# Patient Record
Sex: Male | Born: 1950 | Race: White | Hispanic: No | Marital: Single | Smoking: Former smoker
Health system: Southern US, Community
[De-identification: ages and names within clinical notes are randomized; demographics above are authoritative.]

## PROBLEM LIST (undated history)

## (undated) DIAGNOSIS — E785 Hyperlipidemia, unspecified: Secondary | ICD-10-CM

## (undated) DIAGNOSIS — G4700 Insomnia, unspecified: Secondary | ICD-10-CM

## (undated) DIAGNOSIS — I2510 Atherosclerotic heart disease of native coronary artery without angina pectoris: Secondary | ICD-10-CM

## (undated) DIAGNOSIS — F8081 Childhood onset fluency disorder: Secondary | ICD-10-CM

## (undated) DIAGNOSIS — M1990 Unspecified osteoarthritis, unspecified site: Secondary | ICD-10-CM

## (undated) DIAGNOSIS — F429 Obsessive-compulsive disorder, unspecified: Secondary | ICD-10-CM

## (undated) DIAGNOSIS — I10 Essential (primary) hypertension: Secondary | ICD-10-CM

## (undated) DIAGNOSIS — Z720 Tobacco use: Secondary | ICD-10-CM

## (undated) HISTORY — DX: Childhood onset fluency disorder: F80.81

## (undated) HISTORY — DX: Tobacco use: Z72.0

## (undated) HISTORY — DX: Insomnia, unspecified: G47.00

## (undated) HISTORY — DX: Obsessive-compulsive disorder, unspecified: F42.9

## (undated) HISTORY — PX: WISDOM TOOTH EXTRACTION: SHX21

## (undated) HISTORY — PX: BACK SURGERY: SHX140

## (undated) HISTORY — DX: Unspecified osteoarthritis, unspecified site: M19.90

## (undated) HISTORY — DX: Hyperlipidemia, unspecified: E78.5

## (undated) HISTORY — DX: Atherosclerotic heart disease of native coronary artery without angina pectoris: I25.10

## (undated) HISTORY — DX: Essential (primary) hypertension: I10

---

## 1981-05-21 HISTORY — PX: VASECTOMY: SHX75

## 1997-09-07 ENCOUNTER — Other Ambulatory Visit: Admission: RE | Admit: 1997-09-07 | Discharge: 1997-09-07 | Payer: Self-pay | Admitting: Family Medicine

## 1997-12-09 ENCOUNTER — Emergency Department (HOSPITAL_COMMUNITY): Admission: EM | Admit: 1997-12-09 | Discharge: 1997-12-09 | Payer: Self-pay | Admitting: Emergency Medicine

## 1997-12-14 ENCOUNTER — Other Ambulatory Visit: Admission: RE | Admit: 1997-12-14 | Discharge: 1997-12-14 | Payer: Self-pay | Admitting: Family Medicine

## 1998-01-01 ENCOUNTER — Emergency Department (HOSPITAL_COMMUNITY): Admission: EM | Admit: 1998-01-01 | Discharge: 1998-01-01 | Payer: Self-pay | Admitting: Emergency Medicine

## 1998-08-03 ENCOUNTER — Encounter: Payer: Self-pay | Admitting: Emergency Medicine

## 1998-08-03 ENCOUNTER — Emergency Department (HOSPITAL_COMMUNITY): Admission: EM | Admit: 1998-08-03 | Discharge: 1998-08-03 | Payer: Self-pay | Admitting: Emergency Medicine

## 1998-12-02 ENCOUNTER — Emergency Department (HOSPITAL_COMMUNITY): Admission: EM | Admit: 1998-12-02 | Discharge: 1998-12-02 | Payer: Self-pay | Admitting: Emergency Medicine

## 1998-12-02 ENCOUNTER — Encounter: Payer: Self-pay | Admitting: Emergency Medicine

## 2000-05-09 ENCOUNTER — Encounter: Payer: Self-pay | Admitting: Emergency Medicine

## 2000-05-09 ENCOUNTER — Inpatient Hospital Stay (HOSPITAL_COMMUNITY): Admission: EM | Admit: 2000-05-09 | Discharge: 2000-05-10 | Payer: Self-pay | Admitting: Emergency Medicine

## 2000-05-10 ENCOUNTER — Encounter: Payer: Self-pay | Admitting: Cardiology

## 2004-06-28 ENCOUNTER — Ambulatory Visit: Payer: Self-pay | Admitting: Family Medicine

## 2004-07-12 ENCOUNTER — Ambulatory Visit: Payer: Self-pay | Admitting: Family Medicine

## 2004-08-01 ENCOUNTER — Ambulatory Visit: Payer: Self-pay | Admitting: Family Medicine

## 2004-10-10 ENCOUNTER — Ambulatory Visit: Payer: Self-pay | Admitting: Family Medicine

## 2004-11-01 ENCOUNTER — Ambulatory Visit: Payer: Self-pay | Admitting: Family Medicine

## 2004-11-08 ENCOUNTER — Ambulatory Visit: Payer: Self-pay | Admitting: Family Medicine

## 2004-11-15 ENCOUNTER — Ambulatory Visit: Payer: Self-pay | Admitting: Family Medicine

## 2005-02-20 ENCOUNTER — Ambulatory Visit: Payer: Self-pay | Admitting: Family Medicine

## 2005-06-06 ENCOUNTER — Ambulatory Visit: Payer: Self-pay | Admitting: Family Medicine

## 2005-09-26 ENCOUNTER — Ambulatory Visit: Payer: Self-pay | Admitting: Family Medicine

## 2005-11-07 ENCOUNTER — Ambulatory Visit: Payer: Self-pay | Admitting: Family Medicine

## 2005-12-20 ENCOUNTER — Emergency Department (HOSPITAL_COMMUNITY): Admission: EM | Admit: 2005-12-20 | Discharge: 2005-12-20 | Payer: Self-pay | Admitting: Emergency Medicine

## 2006-05-23 ENCOUNTER — Ambulatory Visit: Payer: Self-pay | Admitting: Family Medicine

## 2006-12-20 ENCOUNTER — Telehealth (INDEPENDENT_AMBULATORY_CARE_PROVIDER_SITE_OTHER): Payer: Self-pay | Admitting: *Deleted

## 2007-01-24 DIAGNOSIS — M545 Low back pain, unspecified: Secondary | ICD-10-CM | POA: Insufficient documentation

## 2007-01-28 ENCOUNTER — Telehealth: Payer: Self-pay | Admitting: Family Medicine

## 2007-01-29 ENCOUNTER — Ambulatory Visit: Payer: Self-pay | Admitting: Family Medicine

## 2007-01-29 DIAGNOSIS — M479 Spondylosis, unspecified: Secondary | ICD-10-CM | POA: Insufficient documentation

## 2007-02-04 ENCOUNTER — Telehealth: Payer: Self-pay | Admitting: Family Medicine

## 2007-02-12 ENCOUNTER — Telehealth: Payer: Self-pay | Admitting: Family Medicine

## 2007-02-27 ENCOUNTER — Telehealth: Payer: Self-pay | Admitting: Family Medicine

## 2007-04-30 ENCOUNTER — Ambulatory Visit: Payer: Self-pay | Admitting: Family Medicine

## 2007-04-30 DIAGNOSIS — E039 Hypothyroidism, unspecified: Secondary | ICD-10-CM | POA: Insufficient documentation

## 2007-04-30 DIAGNOSIS — I10 Essential (primary) hypertension: Secondary | ICD-10-CM | POA: Insufficient documentation

## 2007-04-30 DIAGNOSIS — K219 Gastro-esophageal reflux disease without esophagitis: Secondary | ICD-10-CM | POA: Insufficient documentation

## 2007-04-30 DIAGNOSIS — E785 Hyperlipidemia, unspecified: Secondary | ICD-10-CM | POA: Insufficient documentation

## 2007-05-06 ENCOUNTER — Telehealth: Payer: Self-pay | Admitting: Family Medicine

## 2007-05-06 ENCOUNTER — Ambulatory Visit: Payer: Self-pay | Admitting: Family Medicine

## 2007-05-07 ENCOUNTER — Encounter: Payer: Self-pay | Admitting: Family Medicine

## 2007-07-23 ENCOUNTER — Telehealth: Payer: Self-pay | Admitting: Family Medicine

## 2007-08-20 ENCOUNTER — Telehealth: Payer: Self-pay | Admitting: Family Medicine

## 2007-10-30 ENCOUNTER — Telehealth: Payer: Self-pay | Admitting: Family Medicine

## 2007-11-06 ENCOUNTER — Telehealth: Payer: Self-pay | Admitting: Family Medicine

## 2007-11-19 ENCOUNTER — Telehealth: Payer: Self-pay | Admitting: Family Medicine

## 2008-05-25 ENCOUNTER — Ambulatory Visit: Payer: Self-pay | Admitting: Family Medicine

## 2008-05-25 DIAGNOSIS — F411 Generalized anxiety disorder: Secondary | ICD-10-CM | POA: Insufficient documentation

## 2008-07-20 ENCOUNTER — Encounter: Payer: Self-pay | Admitting: Family Medicine

## 2008-09-14 ENCOUNTER — Ambulatory Visit: Payer: Self-pay | Admitting: Family Medicine

## 2010-06-18 LAB — CONVERTED CEMR LAB
ALT: 23 units/L (ref 0–53)
Basophils Absolute: 0 10*3/uL (ref 0.0–0.1)
Bilirubin Urine: NEGATIVE
Bilirubin, Direct: 0.1 mg/dL (ref 0.0–0.3)
Blood in Urine, dipstick: NEGATIVE
Calcium: 9.6 mg/dL (ref 8.4–10.5)
Cholesterol: 364 mg/dL (ref 0–200)
Eosinophils Absolute: 0.2 10*3/uL (ref 0.0–0.6)
Eosinophils Relative: 2.2 % (ref 0.0–5.0)
GFR calc Af Amer: 112 mL/min
GFR calc non Af Amer: 92 mL/min
Glucose, Bld: 97 mg/dL (ref 70–99)
Glucose, Urine, Semiquant: NEGATIVE
HDL: 37.4 mg/dL — ABNORMAL LOW (ref >39.0)
Ketones, urine, test strip: NEGATIVE
Lymphocytes Relative: 30.9 % (ref 12.0–46.0)
MCHC: 35.2 g/dL (ref 30.0–36.0)
MCV: 90 fL (ref 78.0–100.0)
Monocytes Relative: 9.7 % (ref 3.0–11.0)
Neutro Abs: 3.9 10*3/uL (ref 1.4–7.7)
Nitrite: NEGATIVE
PSA: 2.51 ng/mL (ref 0.10–4.00)
Platelets: 233 10*3/uL (ref 150–400)
Protein, U semiquant: NEGATIVE
Specific Gravity, Urine: 1.015
TSH: 1.22 microintl units/mL (ref 0.35–5.50)
Urobilinogen, UA: 0.2
WBC Urine, dipstick: NEGATIVE
WBC: 7 10*3/uL (ref 4.5–10.5)
pH: 5

## 2010-10-06 NOTE — Discharge Summary (Signed)
[place]  Patient:    Anthony Benton, Anthony Benton                       MRN: [id number] Adm. Date:  [id number] Disc. Date: 05/10/00 Attending:  Rollene Rotunda Dictator:   Joellyn Rued, P.A.C. CC:         Leroy Sea., M.D.  Lacretia Leigh. Quintella Reichert, M.D.   Referring Physician Discharge [place]  DATE OF BIRTH:  [birth year]  SUMMARY OF HISTORY:  Anthony Benton is a 60 year old white male with no known history of coronary disease.  On the day of admission, he had an episode of chest discomfort which he described as numbing and tingling sharp discomfort going from the right to his left side of his chest that began with urination and lasted less than one minute, associated with dizziness.  Approximately eight weeks ago, he had a mild sternal separation and has been taking Tylenol; however, this discomfort was not similar.  He has chronic dyspnea on exertion and occasional skipped beats and long-term PND.  He initially presented to Urgent Care and was transferred to [facility] for further evaluation.  He also has a history of hyperlipidemia but has been wanting a low fat diet, and remote tobacco.  ALLERGIES:  He has a history of ASPIRIN intolerance and muscle aches with ZOCOR.  LABORATORY DATA:  Initial H&H is 14.1 and 38.4, normal indices, platelets 195, wbcs 6.8.  Sodium 137, potassium 3.9, BUN 11, creatinine 0.9, glucose 97. Normal LFTs.  CK total, MBs, and troponins were negative for myocardial infarction.  Lipid panel was performed but is pending at the time of this dictation.  HOSPITAL COURSE:  Anthony Benton was admitted to 6500.  Overnight, he did not have any further discomfort.  Enzymes and EKGs were negative for myocardial infarction.  An adenosine stress test was performed without difficulty. Imaging showed an EF of 55%, no signs of ischemia or scarring.  After reviewing with Dr. Antoine Poche, it was felt that he could be discharge home.  DISCHARGE  DIAGNOSIS:  Noncardiac chest discomfort.  DISPOSITION:  He was discharged home and asked to continue on his home medications.  MEDICATIONS:  P.r.n. Valium, Percocet, and Ventak.  DIET:  He was asked to maintain low salt/fat/cholesterol diet.  FOLLOW-UP:  Will Dr. Scotty Court or an associate of his choice after the holidays. DD:  05/10/00 TD:  05/10/00 Job: [id number]

## 2011-02-19 HISTORY — PX: CORONARY ANGIOPLASTY: SHX604

## 2011-03-19 HISTORY — PX: CARDIAC CATHETERIZATION: SHX172

## 2011-07-17 DIAGNOSIS — I498 Other specified cardiac arrhythmias: Secondary | ICD-10-CM | POA: Diagnosis not present

## 2011-07-17 DIAGNOSIS — E785 Hyperlipidemia, unspecified: Secondary | ICD-10-CM | POA: Diagnosis not present

## 2011-07-17 DIAGNOSIS — I2510 Atherosclerotic heart disease of native coronary artery without angina pectoris: Secondary | ICD-10-CM | POA: Diagnosis not present

## 2011-07-17 DIAGNOSIS — F411 Generalized anxiety disorder: Secondary | ICD-10-CM | POA: Diagnosis not present

## 2011-07-17 DIAGNOSIS — Z79899 Other long term (current) drug therapy: Secondary | ICD-10-CM | POA: Diagnosis not present

## 2011-07-18 DIAGNOSIS — I2510 Atherosclerotic heart disease of native coronary artery without angina pectoris: Secondary | ICD-10-CM | POA: Diagnosis not present

## 2011-08-22 DIAGNOSIS — I2589 Other forms of chronic ischemic heart disease: Secondary | ICD-10-CM | POA: Diagnosis not present

## 2011-08-22 DIAGNOSIS — I10 Essential (primary) hypertension: Secondary | ICD-10-CM | POA: Diagnosis not present

## 2011-08-22 DIAGNOSIS — E785 Hyperlipidemia, unspecified: Secondary | ICD-10-CM | POA: Diagnosis not present

## 2011-10-19 DIAGNOSIS — E785 Hyperlipidemia, unspecified: Secondary | ICD-10-CM | POA: Diagnosis not present

## 2011-10-19 DIAGNOSIS — Z79899 Other long term (current) drug therapy: Secondary | ICD-10-CM | POA: Diagnosis not present

## 2012-02-19 DIAGNOSIS — E785 Hyperlipidemia, unspecified: Secondary | ICD-10-CM | POA: Diagnosis not present

## 2012-02-19 DIAGNOSIS — Z125 Encounter for screening for malignant neoplasm of prostate: Secondary | ICD-10-CM | POA: Diagnosis not present

## 2012-02-19 DIAGNOSIS — I2589 Other forms of chronic ischemic heart disease: Secondary | ICD-10-CM | POA: Diagnosis not present

## 2012-02-19 DIAGNOSIS — M549 Dorsalgia, unspecified: Secondary | ICD-10-CM | POA: Diagnosis not present

## 2012-02-19 DIAGNOSIS — I10 Essential (primary) hypertension: Secondary | ICD-10-CM | POA: Diagnosis not present

## 2012-06-20 DIAGNOSIS — I10 Essential (primary) hypertension: Secondary | ICD-10-CM | POA: Diagnosis not present

## 2012-06-20 DIAGNOSIS — E785 Hyperlipidemia, unspecified: Secondary | ICD-10-CM | POA: Diagnosis not present

## 2012-06-20 DIAGNOSIS — I2589 Other forms of chronic ischemic heart disease: Secondary | ICD-10-CM | POA: Diagnosis not present

## 2012-06-20 DIAGNOSIS — M5380 Other specified dorsopathies, site unspecified: Secondary | ICD-10-CM | POA: Diagnosis not present

## 2012-09-08 DIAGNOSIS — I10 Essential (primary) hypertension: Secondary | ICD-10-CM | POA: Diagnosis not present

## 2012-09-08 DIAGNOSIS — I498 Other specified cardiac arrhythmias: Secondary | ICD-10-CM | POA: Diagnosis not present

## 2012-09-08 DIAGNOSIS — E785 Hyperlipidemia, unspecified: Secondary | ICD-10-CM | POA: Diagnosis not present

## 2012-09-08 DIAGNOSIS — I2510 Atherosclerotic heart disease of native coronary artery without angina pectoris: Secondary | ICD-10-CM | POA: Diagnosis not present

## 2013-01-12 DIAGNOSIS — E785 Hyperlipidemia, unspecified: Secondary | ICD-10-CM | POA: Diagnosis not present

## 2013-01-12 DIAGNOSIS — M549 Dorsalgia, unspecified: Secondary | ICD-10-CM | POA: Diagnosis not present

## 2013-01-12 DIAGNOSIS — M1990 Unspecified osteoarthritis, unspecified site: Secondary | ICD-10-CM | POA: Diagnosis not present

## 2013-04-01 DIAGNOSIS — E7800 Pure hypercholesterolemia, unspecified: Secondary | ICD-10-CM | POA: Diagnosis not present

## 2013-04-01 DIAGNOSIS — I259 Chronic ischemic heart disease, unspecified: Secondary | ICD-10-CM | POA: Diagnosis not present

## 2013-04-01 DIAGNOSIS — Z87891 Personal history of nicotine dependence: Secondary | ICD-10-CM | POA: Diagnosis not present

## 2013-04-01 DIAGNOSIS — M545 Low back pain, unspecified: Secondary | ICD-10-CM | POA: Diagnosis not present

## 2013-04-01 DIAGNOSIS — F8081 Childhood onset fluency disorder: Secondary | ICD-10-CM | POA: Diagnosis not present

## 2013-04-01 DIAGNOSIS — I10 Essential (primary) hypertension: Secondary | ICD-10-CM | POA: Diagnosis not present

## 2013-04-01 DIAGNOSIS — G4700 Insomnia, unspecified: Secondary | ICD-10-CM | POA: Diagnosis not present

## 2013-04-21 ENCOUNTER — Encounter: Payer: Self-pay | Admitting: Interventional Cardiology

## 2013-04-21 ENCOUNTER — Encounter (INDEPENDENT_AMBULATORY_CARE_PROVIDER_SITE_OTHER): Payer: Self-pay

## 2013-04-21 ENCOUNTER — Ambulatory Visit (INDEPENDENT_AMBULATORY_CARE_PROVIDER_SITE_OTHER): Payer: Medicare Other | Admitting: Interventional Cardiology

## 2013-04-21 VITALS — BP 158/92 | HR 53 | Ht 68.0 in | Wt 164.0 lb

## 2013-04-21 DIAGNOSIS — E785 Hyperlipidemia, unspecified: Secondary | ICD-10-CM

## 2013-04-21 DIAGNOSIS — I10 Essential (primary) hypertension: Secondary | ICD-10-CM

## 2013-04-21 DIAGNOSIS — I259 Chronic ischemic heart disease, unspecified: Secondary | ICD-10-CM | POA: Diagnosis not present

## 2013-04-21 DIAGNOSIS — I25810 Atherosclerosis of coronary artery bypass graft(s) without angina pectoris: Secondary | ICD-10-CM

## 2013-04-21 DIAGNOSIS — I2510 Atherosclerotic heart disease of native coronary artery without angina pectoris: Secondary | ICD-10-CM | POA: Diagnosis not present

## 2013-04-21 NOTE — Patient Instructions (Signed)
Your physician recommends that you continue on your current medications as directed. Please refer to the Current Medication list given to you today.  Your physician wants you to follow-up in: 1 year with Dr. Varanasi. You will receive a reminder letter in the mail two months in advance. If you don't receive a letter, please call our office to schedule the follow-up appointment.  

## 2013-04-21 NOTE — Progress Notes (Signed)
Patient ID: Anthony Benton, male   DOB: [birth year], 62 y.o.   MRN: [id number]     Patient ID: Anthony Benton MRN: [id number] DOB/AGE: 03/22/1951 62 y.o.   Referring Physician Dr. Sigmund Hazel   Reason for Consultation CAD  HPI: 62 y/o who has a h/o CAD.  He had 2 stents to the RCA, 3.5x 15 Xience and 3.5 x 8 Xience.  He was flown by helicopter but he did not have a heart attack.  His initial sx were associated with walking on his long  Driveway, which he would do for exercise.  While walking one day, he had a "popping feeling" in his chest and then [facility].  He went into the house and sat and it resolved.  He felt very weak while walking to the house.  He tried to walk again and then sx returned.  He had not felt these sx like this before.  His face was red.  He had repetetive episodes and sought attention.    He has high cholesterol.  It runs in the family.  Mother's cholesterol is over 300.  No recent sx like what he had at the time of the stents.  He was seeing his cardiologist once a year. He feels well.  He walks [place] exercise at the mall.  He has an exercise Wii board at home.  No sx with these activities.  He took Effient for a year after the stents.     Current Outpatient Prescriptions  Medication Sig Dispense Refill  . aspirin 81 MG tablet Take 81 mg by mouth daily.      Marland Kitchen atorvastatin (LIPITOR) 20 MG tablet Take 20 mg by mouth daily at 6 PM.       . chlorhexidine (PERIDEX) 0.12 % solution Use as directed 15 mLs in the mouth or throat 2 (two) times daily.      . diazepam (VALIUM) 5 MG tablet Take 5 mg by mouth every 6 (six) hours as needed.       Marland Kitchen HYDROcodone-acetaminophen (NORCO) 10-325 MG per tablet Take 1 tablet by mouth every 6 (six) hours as needed.       . metoprolol succinate (TOPROL-XL) 50 MG 24 hr tablet Take 50 mg by mouth daily.       . Omega-3 Fatty Acids (FISH OIL) 1000 MG CAPS Take 1,000 mg by mouth daily.      . temazepam (RESTORIL) 15 MG capsule Take 15 mg  by mouth at bedtime.        No current facility-administered medications for this visit.   Past Medical History  Diagnosis Date  . Coronary artery disease   . OCD (obsessive compulsive disorder)     with anxiety  . Arthritis     neck, knees; seen Dr. Jerl Santos, Norco 3-4 per day, 14 yrs, stable  . Insomnia   . Stuttering     uses Diazepam  . Hyperlipidemia   . Hypertension   . Tobacco abuse 1999, quit    Family History  Problem Relation Age of Onset  . Arthritis Father   . Hypertension Father   . Prostate cancer Father   . Arthritis Sister   . Breast cancer Sister     History   Social History  . Marital Status: Married    Spouse Name: N/A    Number of Children: N/A  . Years of Education: N/A   Occupational History  . Not on file.   Social History Main Topics  .  Smoking status: Former Games developer  . Smokeless tobacco: Not on file  . Alcohol Use: No  . Drug Use: Not on file  . Sexual Activity: Not on file   Other Topics Concern  . Not on file   Social History Narrative  . No narrative on file    Past Surgical History  Procedure Laterality Date  . Cardiac catheterization  03/19/11    stent X 2, in [place]  . Coronary angioplasty  10/12  . Vasectomy  1983  . Back surgery  1994, 1997  . Wisdom tooth extraction        (Not in a hospital admission)  Review of systems complete and found to be negative unless listed above .  No nausea, vomiting.  No fever chills, No focal weakness,  No palpitations.  Physical Exam: Filed Vitals:   04/21/13 1106  BP: 158/92  Pulse: 53    Weight: 164 lb (74.39 kg)  Physical exam:  [place]/AT EOMI No JVD, No carotid bruit RRR S1S2  No wheezing Soft. NT, nondistended No edema. No focal motor or sensory deficits Normal affect  Labs:   Lab Results  Component Value Date   WBC 7.0 04/30/2007   HGB 14.3 04/30/2007   HCT 40.5 04/30/2007   MCV 90.0 04/30/2007   PLT 233 04/30/2007   No results found for this basename: NA, K,  CL, CO2, BUN, CREATININE, CALCIUM, LABALBU, PROT, BILITOT, ALKPHOS, ALT, AST, GLUCOSE,  in the last 168 hours No results found for this basename: CKTOTAL, CKMB, CKMBINDEX, TROPONINI    Lab Results  Component Value Date   CHOL 364* 04/30/2007   Lab Results  Component Value Date   HDL 37.4* 04/30/2007   No results found for this basename: [facility]   Lab Results  Component Value Date   TRIG 325* 04/30/2007   Lab Results  Component Value Date   CHOLHDL 9.7 CALC 04/30/2007   Lab Results  Component Value Date   LDLDIRECT 231.1 04/30/2007       EKG:NSR, NSST  ASSESSMENT AND PLAN:  1. CAD: s/p DES x2 in the RCA in 2012.  No angina.  COntinue risk factor modification.  Without medication, his lipids are very high. This runs in his family. I stressed the importance of diet control and exercise.  He needs at least 150 minutes per week of cardiovascular exercise.  2. hypertension: Blood pressure higher in the doctor's office. Continue metoprolol. I Asked him to check his blood pressure at home. If readings are less than 140/90, he doesn't need to check more than once a month.  3. hyperlipidemia: He has been on lovastatin in the past. This was ineffective. He states that his cholesterol is much better controlled on his current dose of atorvastatin. This was last checked in [place] with his primary care Dr. He has an appointment with Dr. Hyacinth Meeker to check his lipids in February. I reviewed his prior lab results. He has total cholesterol values over 300 in the past when not taking a statin.  Signed:   Fredric Mare, MD, [facility] 04/21/2013, 11:31 AM

## 2013-06-25 DIAGNOSIS — M545 Low back pain, unspecified: Secondary | ICD-10-CM | POA: Diagnosis not present

## 2013-07-02 DIAGNOSIS — E7800 Pure hypercholesterolemia, unspecified: Secondary | ICD-10-CM | POA: Diagnosis not present

## 2013-07-02 DIAGNOSIS — G4700 Insomnia, unspecified: Secondary | ICD-10-CM | POA: Diagnosis not present

## 2013-07-02 DIAGNOSIS — I10 Essential (primary) hypertension: Secondary | ICD-10-CM | POA: Diagnosis not present

## 2013-07-02 DIAGNOSIS — M545 Low back pain, unspecified: Secondary | ICD-10-CM | POA: Diagnosis not present

## 2013-07-02 DIAGNOSIS — Z87891 Personal history of nicotine dependence: Secondary | ICD-10-CM | POA: Diagnosis not present

## 2013-07-02 DIAGNOSIS — I2510 Atherosclerotic heart disease of native coronary artery without angina pectoris: Secondary | ICD-10-CM | POA: Diagnosis not present

## 2013-07-03 ENCOUNTER — Telehealth: Payer: Self-pay | Admitting: Interventional Cardiology

## 2013-07-03 NOTE — Telephone Encounter (Signed)
To Dr. Varanasi, please advise.  

## 2013-07-03 NOTE — Telephone Encounter (Signed)
New Problem:   According to pt, he was seen by his PcP and was told to be seen by his cardiologist today. Pt's BP has been up... 189/110 pulse 65 ... 194/96 pulse 64... 179/100 pulse was 64.   177/100 pulse 58  182/93 pulse 63... All vitals are from last night and this morning... Pt would like a call back to know if he can be seen by his cardiologist.

## 2013-07-03 NOTE — Telephone Encounter (Signed)
OK to start lisinopril 10 mg daily for one week.  BMet in one week.  CONtinue to check BP and let us know if it runs > 140/90.  We can see him today if he would like.

## 2013-07-03 NOTE — Telephone Encounter (Signed)
Spoke with pt and Dr. Sabra Heck already started pt on lisinopril 20 mg qd and Dr. Irish Lack is ok with this. Pt will follow with Dr. Sabra Heck regarding further BP medication adjustments.

## 2013-07-13 DIAGNOSIS — Z1211 Encounter for screening for malignant neoplasm of colon: Secondary | ICD-10-CM | POA: Diagnosis not present

## 2013-08-05 DIAGNOSIS — R195 Other fecal abnormalities: Secondary | ICD-10-CM | POA: Diagnosis not present

## 2013-08-05 DIAGNOSIS — Z1211 Encounter for screening for malignant neoplasm of colon: Secondary | ICD-10-CM | POA: Diagnosis not present

## 2013-10-19 DIAGNOSIS — M545 Low back pain, unspecified: Secondary | ICD-10-CM | POA: Diagnosis not present

## 2013-10-19 DIAGNOSIS — F8081 Childhood onset fluency disorder: Secondary | ICD-10-CM | POA: Diagnosis not present

## 2013-10-19 DIAGNOSIS — E7800 Pure hypercholesterolemia, unspecified: Secondary | ICD-10-CM | POA: Diagnosis not present

## 2013-10-19 DIAGNOSIS — R195 Other fecal abnormalities: Secondary | ICD-10-CM | POA: Diagnosis not present

## 2013-10-19 DIAGNOSIS — I10 Essential (primary) hypertension: Secondary | ICD-10-CM | POA: Diagnosis not present

## 2013-10-19 DIAGNOSIS — G4700 Insomnia, unspecified: Secondary | ICD-10-CM | POA: Diagnosis not present

## 2013-10-19 DIAGNOSIS — R5381 Other malaise: Secondary | ICD-10-CM | POA: Diagnosis not present

## 2013-10-19 DIAGNOSIS — R5383 Other fatigue: Secondary | ICD-10-CM | POA: Diagnosis not present

## 2013-10-26 ENCOUNTER — Telehealth: Payer: Self-pay | Admitting: Family Medicine

## 2013-10-26 NOTE — Telephone Encounter (Signed)
Pts has seen dr Joni Fears in past and would like to re-est with dr fry. Pt wife also would like to re-est. Pt wife is aware dr fry said no, hus is requesting this time. Please advise

## 2013-10-27 NOTE — Telephone Encounter (Signed)
Sorry but I am too full  

## 2013-10-27 NOTE — Telephone Encounter (Signed)
Pt is aware.  

## 2014-01-28 ENCOUNTER — Encounter: Payer: Self-pay | Admitting: Interventional Cardiology

## 2014-01-28 DIAGNOSIS — M545 Low back pain, unspecified: Secondary | ICD-10-CM | POA: Diagnosis not present

## 2014-01-28 DIAGNOSIS — F8081 Childhood onset fluency disorder: Secondary | ICD-10-CM | POA: Diagnosis not present

## 2014-01-28 DIAGNOSIS — F411 Generalized anxiety disorder: Secondary | ICD-10-CM | POA: Diagnosis not present

## 2014-01-28 DIAGNOSIS — Z79899 Other long term (current) drug therapy: Secondary | ICD-10-CM | POA: Diagnosis not present

## 2014-01-28 DIAGNOSIS — G4700 Insomnia, unspecified: Secondary | ICD-10-CM | POA: Diagnosis not present

## 2014-01-28 DIAGNOSIS — I2510 Atherosclerotic heart disease of native coronary artery without angina pectoris: Secondary | ICD-10-CM | POA: Diagnosis not present

## 2014-01-28 DIAGNOSIS — E7800 Pure hypercholesterolemia, unspecified: Secondary | ICD-10-CM | POA: Diagnosis not present

## 2014-02-16 ENCOUNTER — Telehealth: Payer: Self-pay | Admitting: Interventional Cardiology

## 2014-02-16 NOTE — Telephone Encounter (Signed)
Last stents were in 2012 per our records.  If he has not had a stent since that time, he can get the shot if it helps his back.

## 2014-02-16 NOTE — Telephone Encounter (Signed)
New message          Pt has chronic back pain and he normally gets a shot every Fall / pt would like to know if he should take the shot from a cardiac standpoint

## 2014-02-16 NOTE — Telephone Encounter (Signed)
To Dr. Varanasi, please advise.  

## 2014-02-17 NOTE — Telephone Encounter (Signed)
lmtrc

## 2014-02-18 NOTE — Telephone Encounter (Signed)
Pt states his last stent were placed in 2012. Pt notified that he can have shot in his back.

## 2014-03-01 ENCOUNTER — Ambulatory Visit (INDEPENDENT_AMBULATORY_CARE_PROVIDER_SITE_OTHER): Payer: Medicare Other | Admitting: Interventional Cardiology

## 2014-03-01 ENCOUNTER — Encounter: Payer: Self-pay | Admitting: Interventional Cardiology

## 2014-03-01 VITALS — BP 168/90 | HR 59 | Ht 68.0 in | Wt 166.1 lb

## 2014-03-01 DIAGNOSIS — E785 Hyperlipidemia, unspecified: Secondary | ICD-10-CM

## 2014-03-01 DIAGNOSIS — I10 Essential (primary) hypertension: Secondary | ICD-10-CM

## 2014-03-01 DIAGNOSIS — I2510 Atherosclerotic heart disease of native coronary artery without angina pectoris: Secondary | ICD-10-CM | POA: Diagnosis not present

## 2014-03-01 NOTE — Progress Notes (Signed)
Patient ID: Anthony Benton, male   DOB: 10-24-1950, 63 y.o.   MRN: [id number] Patient ID: Anthony Benton, male   DOB: 1950/10/06, 63 y.o.   MRN: [id number]     Patient ID: Anthony Benton MRN: [id number] DOB/AGE: [birth year] 63 y.o.   Referring Physician Dr. Kathyrn Lass   Reason for Consultation CAD  HPI: 63 y/o who has a h/o CAD.  He had 2 stents to the RCA, 3.5x 15 Xience and 3.5 x 8 Xience in 02/2011.  He was flown by helicopter but he did not have a heart attack.  His initial sx were associated with walking on his long  Driveway, which he would do for exercise.  While walking one day, he had a "popping feeling" in his chest and then [facility].  He went into the house and sat and it resolved.  He felt very weak while walking to the house.  He tried to walk again and then sx returned.  He had not felt these sx like this before.  His face was red.  He had repetetive episodes and sought attention.    He has high cholesterol.  It runs in the family.  Mother's cholesterol is over 300.  No recent sx like what he had at the time of the stents.  He was seeing his cardiologist once a year. He feels well.  He walks regularly for exercise at the mall.  He has an exercise Wii board at home.  No sx with these activities.  He took Effient for a year after the stents.    He walks several miles a day for 3-4 x/week. No cardiac sx.     Current Outpatient Prescriptions  Medication Sig Dispense Refill  . aspirin 81 MG tablet Take 81 mg by mouth daily.      Marland Kitchen atorvastatin (LIPITOR) 20 MG tablet Take 40 mg by mouth daily at 6 PM.       . chlorhexidine (PERIDEX) 0.12 % solution Use as directed 15 mLs in the mouth or throat 2 (two) times daily.      . diazepam (VALIUM) 5 MG tablet Take 5 mg by mouth every 6 (six) hours as needed.       Marland Kitchen HYDROcodone-acetaminophen (NORCO) 10-325 MG per tablet Take 1 tablet by mouth every 6 (six) hours as needed.       . metoprolol succinate (TOPROL-XL) 50 MG 24 hr tablet Take 50  mg by mouth daily.       . Omega-3 Fatty Acids (FISH OIL) 1000 MG CAPS Take 1,000 mg by mouth daily.      . temazepam (RESTORIL) 15 MG capsule Take 15 mg by mouth at bedtime.        No current facility-administered medications for this visit.   Past Medical History  Diagnosis Date  . Coronary artery disease   . OCD (obsessive compulsive disorder)     with anxiety  . Arthritis     neck, knees; seen Dr. Rhona Raider, Norco 3-4 per day, 14 yrs, stable  . Insomnia   . Stuttering     uses Diazepam  . Hyperlipidemia   . Hypertension   . Tobacco abuse 1999, quit    Family History  Problem Relation Age of Onset  . Arthritis Father   . Hypertension Father   . Prostate cancer Father   . Arthritis Sister   . Breast cancer Sister     History   Social History  . Marital Status: Married  Spouse Name: N/A    Number of Children: N/A  . Years of Education: N/A   Occupational History  . Not on file.   Social History Main Topics  . Smoking status: Former Research scientist (life sciences)  . Smokeless tobacco: Not on file  . Alcohol Use: No  . Drug Use: Not on file  . Sexual Activity: Not on file   Other Topics Concern  . Not on file   Social History Narrative  . No narrative on file    Past Surgical History  Procedure Laterality Date  . Cardiac catheterization  03/19/11    stent X 2, in [place]  . Coronary angioplasty  10/12  . Vasectomy  1983  . Back surgery  1994, 1997  . Wisdom tooth extraction        (Not in a hospital admission)  Review of systems complete and found to be negative unless listed above .  No nausea, vomiting.  No fever chills, No focal weakness,  No palpitations.  Physical Exam: Filed Vitals:   03/01/14 1328  BP: 168/90  Pulse: 59    Weight: 166 lb 1.9 oz (75.352 kg)  Physical exam:  [place]/AT EOMI No JVD, No carotid bruit RRR S1S2  No wheezing Soft. NT, nondistended No edema. No focal motor or sensory deficits Normal affect  Labs:   Lab Results  Component Value  Date   WBC 7.0 04/30/2007   HGB 14.3 04/30/2007   HCT 40.5 04/30/2007   MCV 90.0 04/30/2007   PLT 233 04/30/2007   No results found for this basename: NA, K, CL, CO2, BUN, CREATININE, CALCIUM, LABALBU, PROT, BILITOT, ALKPHOS, ALT, AST, GLUCOSE,  in the last 168 hours No results found for this basename: CKTOTAL,  CKMB,  CKMBINDEX,  TROPONINI    Lab Results  Component Value Date   CHOL 364* 04/30/2007   Lab Results  Component Value Date   HDL 37.4* 04/30/2007   No results found for this basename: [facility]   Lab Results  Component Value Date   TRIG 325* 04/30/2007   Lab Results  Component Value Date   CHOLHDL 9.7 CALC 04/30/2007   Lab Results  Component Value Date   LDLDIRECT 231.1 04/30/2007       EKG:NSR, NSST  ASSESSMENT AND PLAN:  1. CAD: s/p DES x2 in the RCA in 2012.  No angina.  COntinue risk factor modification.  Without medication, his lipids are very high. This runs in his family. I stressed the importance of diet control and exercise.  He needs at least 150 minutes per week of cardiovascular exercise.  He is doing at least this much walking.    2. hypertension: Blood pressure higher in the doctor's office. Continue metoprolol. I Asked him to check his blood pressure at home. If readings are less than 140/90, he doesn't need to check more than once a month.  3. hyperlipidemia: He has been on lovastatin in the past. This was ineffective. He states that his cholesterol is much better controlled on his current dose of atorvastatin.  He has an appointment with Dr. Sabra Heck to check his lipids in February. I reviewed his prior lab results. He has total cholesterol values over 300 in the past when not taking a statin.  LDL 114 on most recent test.  Normal liver and kidney function.LDL target < 100.    Chronic back pain.  OK to have steroid injections.  Signed:   Mina Marble, MD, [facility] 03/01/2014, 1:56 PM

## 2014-03-01 NOTE — Patient Instructions (Addendum)
Your physician recommends that you continue on your current medications as directed. Please refer to the Current Medication list given to you today.  Your physician recommends that you return for a FASTING lipid and hepatic on 03/22/14.  Your physician wants you to follow-up in: 1 year with Dr. Irish Lack. You will receive a reminder letter in the mail two months in advance. If you don't receive a letter, please call our office to schedule the follow-up appointment.

## 2014-03-04 DIAGNOSIS — M545 Low back pain: Secondary | ICD-10-CM | POA: Diagnosis not present

## 2014-03-04 DIAGNOSIS — M2560 Stiffness of unspecified joint, not elsewhere classified: Secondary | ICD-10-CM | POA: Diagnosis not present

## 2014-03-15 DIAGNOSIS — M25562 Pain in left knee: Secondary | ICD-10-CM | POA: Diagnosis not present

## 2014-03-22 ENCOUNTER — Other Ambulatory Visit (INDEPENDENT_AMBULATORY_CARE_PROVIDER_SITE_OTHER): Payer: Medicare Other | Admitting: *Deleted

## 2014-03-22 DIAGNOSIS — E785 Hyperlipidemia, unspecified: Secondary | ICD-10-CM | POA: Diagnosis not present

## 2014-03-22 LAB — HEPATIC FUNCTION PANEL
ALBUMIN: 3.5 g/dL (ref 3.5–5.2)
ALK PHOS: 67 U/L (ref 39–117)
ALT: 12 U/L (ref 0–53)
AST: 15 U/L (ref 0–37)
Bilirubin, Direct: 0.1 mg/dL (ref 0.0–0.3)
TOTAL PROTEIN: 7 g/dL (ref 6.0–8.3)
Total Bilirubin: 0.8 mg/dL (ref 0.2–1.2)

## 2014-03-22 LAB — LIPID PANEL
CHOL/HDL RATIO: 4
Cholesterol: 189 mg/dL (ref 0–200)
HDL: 45.5 mg/dL (ref >39.00)
LDL CALC: 108 mg/dL — AB (ref 0–99)
NONHDL: 143.5
TRIGLYCERIDES: 180 mg/dL — AB (ref 0.0–149.0)
VLDL: 36 mg/dL (ref 0.0–40.0)

## 2014-04-08 NOTE — Progress Notes (Signed)
Patient's only phone number is disconnected. Will send him a letter to call us.

## 2014-04-14 ENCOUNTER — Telehealth: Payer: Self-pay | Admitting: Interventional Cardiology

## 2014-04-14 DIAGNOSIS — E785 Hyperlipidemia, unspecified: Secondary | ICD-10-CM

## 2014-04-14 MED ORDER — ATORVASTATIN CALCIUM 80 MG PO TABS: ORAL_TABLET | ORAL | Status: DC

## 2014-04-14 NOTE — Telephone Encounter (Signed)
The patient is aware of his cholesterol readings. He already has some joint discomfort from the lipitor 40 mg daily that he takes. He will try to increase lipitor to 40 mg once every other day alternating with 80 mg once every other day. He will call if he does not tolerate the increase.

## 2014-04-14 NOTE — Telephone Encounter (Signed)
New message ° ° ° ° °Want lab results °

## 2014-05-10 DIAGNOSIS — Z87891 Personal history of nicotine dependence: Secondary | ICD-10-CM | POA: Diagnosis not present

## 2014-05-10 DIAGNOSIS — I2510 Atherosclerotic heart disease of native coronary artery without angina pectoris: Secondary | ICD-10-CM | POA: Diagnosis not present

## 2014-05-10 DIAGNOSIS — G4700 Insomnia, unspecified: Secondary | ICD-10-CM | POA: Diagnosis not present

## 2014-05-10 DIAGNOSIS — M545 Low back pain: Secondary | ICD-10-CM | POA: Diagnosis not present

## 2014-05-10 DIAGNOSIS — F8081 Childhood onset fluency disorder: Secondary | ICD-10-CM | POA: Diagnosis not present

## 2014-05-10 DIAGNOSIS — I10 Essential (primary) hypertension: Secondary | ICD-10-CM | POA: Diagnosis not present

## 2014-05-10 DIAGNOSIS — E780 Pure hypercholesterolemia: Secondary | ICD-10-CM | POA: Diagnosis not present

## 2014-05-11 ENCOUNTER — Other Ambulatory Visit: Payer: Self-pay | Admitting: *Deleted

## 2014-05-11 DIAGNOSIS — E785 Hyperlipidemia, unspecified: Secondary | ICD-10-CM

## 2014-05-11 MED ORDER — ATORVASTATIN CALCIUM 80 MG PO TABS: ORAL_TABLET | ORAL | Status: DC

## 2014-06-09 DIAGNOSIS — M25562 Pain in left knee: Secondary | ICD-10-CM | POA: Diagnosis not present

## 2014-07-19 ENCOUNTER — Telehealth: Payer: Self-pay | Admitting: Interventional Cardiology

## 2014-07-19 NOTE — Telephone Encounter (Signed)
New Message   Please give patient a call. He is trying to schedule a lab appt and I was unable to without an order.( Your physician recommends that you return for a FASTING lipid and hepatic on 03/22/14.) This is the note from his last visit which was on in October.

## 2014-07-19 NOTE — Telephone Encounter (Signed)
I called the patient to schedule labs for him. He states that he has scheduled these else where and did not let me know where. He states he was very disturbed by the way the person answering the phone spoke with him. I apologized for his experience and asked if I could give him our fax # that he could have these sent to our office. He asked for my direct #. I advised him I am not always at the same phone. He did not want our fax #. He stated he arranged for his labs else where and "let's just leave it at that."

## 2014-07-19 NOTE — Telephone Encounter (Signed)
Will notify scheduling supervisor.

## 2014-08-02 DIAGNOSIS — E780 Pure hypercholesterolemia: Secondary | ICD-10-CM | POA: Diagnosis not present

## 2014-08-04 ENCOUNTER — Encounter: Payer: Self-pay | Admitting: Interventional Cardiology

## 2014-08-23 DIAGNOSIS — G4700 Insomnia, unspecified: Secondary | ICD-10-CM | POA: Diagnosis not present

## 2014-08-23 DIAGNOSIS — E780 Pure hypercholesterolemia: Secondary | ICD-10-CM | POA: Diagnosis not present

## 2014-08-23 DIAGNOSIS — I10 Essential (primary) hypertension: Secondary | ICD-10-CM | POA: Diagnosis not present

## 2014-08-23 DIAGNOSIS — I2510 Atherosclerotic heart disease of native coronary artery without angina pectoris: Secondary | ICD-10-CM | POA: Diagnosis not present

## 2014-08-23 DIAGNOSIS — F8081 Childhood onset fluency disorder: Secondary | ICD-10-CM | POA: Diagnosis not present

## 2014-08-23 DIAGNOSIS — M545 Low back pain: Secondary | ICD-10-CM | POA: Diagnosis not present

## 2014-10-29 DIAGNOSIS — M25562 Pain in left knee: Secondary | ICD-10-CM | POA: Diagnosis not present

## 2014-11-17 ENCOUNTER — Other Ambulatory Visit: Payer: Self-pay | Admitting: Interventional Cardiology

## 2014-11-17 ENCOUNTER — Other Ambulatory Visit: Payer: Self-pay | Admitting: *Deleted

## 2014-11-17 DIAGNOSIS — E785 Hyperlipidemia, unspecified: Secondary | ICD-10-CM

## 2014-11-17 MED ORDER — ATORVASTATIN CALCIUM 80 MG PO TABS: ORAL_TABLET | ORAL | Status: DC

## 2014-11-30 DIAGNOSIS — G4700 Insomnia, unspecified: Secondary | ICD-10-CM | POA: Diagnosis not present

## 2014-11-30 DIAGNOSIS — M545 Low back pain: Secondary | ICD-10-CM | POA: Diagnosis not present

## 2015-01-11 ENCOUNTER — Encounter: Payer: Self-pay | Admitting: Interventional Cardiology

## 2015-02-21 DIAGNOSIS — Z23 Encounter for immunization: Secondary | ICD-10-CM | POA: Diagnosis not present

## 2015-03-11 DIAGNOSIS — F8081 Childhood onset fluency disorder: Secondary | ICD-10-CM | POA: Diagnosis not present

## 2015-03-11 DIAGNOSIS — M545 Low back pain: Secondary | ICD-10-CM | POA: Diagnosis not present

## 2015-03-11 DIAGNOSIS — E7800 Pure hypercholesterolemia, unspecified: Secondary | ICD-10-CM | POA: Diagnosis not present

## 2015-03-11 DIAGNOSIS — G4700 Insomnia, unspecified: Secondary | ICD-10-CM | POA: Diagnosis not present

## 2015-03-11 DIAGNOSIS — I10 Essential (primary) hypertension: Secondary | ICD-10-CM | POA: Diagnosis not present

## 2015-03-16 ENCOUNTER — Ambulatory Visit (INDEPENDENT_AMBULATORY_CARE_PROVIDER_SITE_OTHER): Payer: Medicare Other | Admitting: Interventional Cardiology

## 2015-03-16 ENCOUNTER — Encounter: Payer: Self-pay | Admitting: Interventional Cardiology

## 2015-03-16 VITALS — BP 154/90 | HR 54 | Ht 69.0 in | Wt 168.8 lb

## 2015-03-16 DIAGNOSIS — I10 Essential (primary) hypertension: Secondary | ICD-10-CM

## 2015-03-16 DIAGNOSIS — I2510 Atherosclerotic heart disease of native coronary artery without angina pectoris: Secondary | ICD-10-CM

## 2015-03-16 DIAGNOSIS — E785 Hyperlipidemia, unspecified: Secondary | ICD-10-CM | POA: Diagnosis not present

## 2015-03-16 NOTE — Progress Notes (Signed)
Patient ID: Anthony Benton, male   DOB: 06/24/1950, 64 y.o.   MRN: [id number]     Cardiology Office Note   Date:  03/16/2015   ID:  TEONDRE JAROSZ, DOB [birth year], MRN [id number]  PCP:  Tawanna Solo, MD    No chief complaint on file. f/u CAD   Wt Readings from Last 3 Encounters:  03/16/15 168 lb 12.8 oz (76.567 kg)  03/01/14 166 lb 1.9 oz (75.352 kg)  04/21/13 164 lb (74.39 kg)       History of Present Illness: Anthony Benton is a 64 y.o. male  who has a h/o CAD. He had 2 stents to the RCA, 3.5x 15 Xience and 3.5 x 8 Xience in 02/2011. He was flown by helicopter but he did not have a heart attack. His initial sx were associated with walking on his long Driveway, which he would do for exercise.  He has high cholesterol. It runs in the family. Mother's cholesterol is over 300. No recent sx like what he had at the time of the stents.   He walks several miles a day for 6 x/week. No cardiac sx.  HE has some stress at home.  His sister has stage IV cancer.  His mother is in a nursing home.   He remains actively involved in his church and is nw a born again [place].    Past Medical History  Diagnosis Date  . Coronary artery disease   . OCD (obsessive compulsive disorder)     with anxiety  . Arthritis     neck, knees; seen Dr. Rhona Raider, Norco 3-4 per day, 14 yrs, stable  . Insomnia   . Stuttering     uses Diazepam  . Hyperlipidemia   . Hypertension   . Tobacco abuse 1999, quit    Past Surgical History  Procedure Laterality Date  . Cardiac catheterization  03/19/11    stent X 2, in [place]  . Coronary angioplasty  10/12  . Vasectomy  1983  . Back surgery  1994, 1997  . Wisdom tooth extraction       Current Outpatient Prescriptions  Medication Sig Dispense Refill  . aspirin 81 MG tablet Take 81 mg by mouth daily.    Marland Kitchen atorvastatin (LIPITOR) 80 MG tablet Take one tablet by mouth daily for cholesterol 90 tablet 2  . chlorhexidine (PERIDEX) 0.12  % solution Use as directed 15 mLs in the mouth or throat 2 (two) times daily.    . diazepam (VALIUM) 5 MG tablet Take 5 mg by mouth every 6 (six) hours as needed for anxiety (speech).     Marland Kitchen HYDROcodone-acetaminophen (NORCO) 10-325 MG per tablet Take 1 tablet by mouth every 6 (six) hours as needed (back pain).     . metoprolol succinate (TOPROL-XL) 50 MG 24 hr tablet Take 50 mg by mouth daily.     . Omega-3 Fatty Acids (FISH OIL) 1000 MG CAPS Take 1,000 mg by mouth daily.    . temazepam (RESTORIL) 15 MG capsule Take 15 mg by mouth at bedtime.      No current facility-administered medications for this visit.    Allergies:   Penicillins    Social History:  The patient  reports that he has quit smoking. He has never used smokeless tobacco. He reports that he does not drink alcohol.   Family History:  The patient's *family history includes Arthritis in his father and sister; Breast cancer in his sister; Hypertension in his father; Prostate cancer  in his father.    ROS:  Please see the history of present illness.   Otherwise, review of systems are positive for emotional stress.   All other systems are reviewed and negative.    PHYSICAL EXAM: VS:  BP 154/90 mmHg  Pulse 54  Ht 5\' 9"  (1.753 m)  Wt 168 lb 12.8 oz (76.567 kg)  BMI 24.92 kg/m2 , BMI Body mass index is 24.92 kg/(m^2). GEN: Well nourished, well developed, in no acute distress HEENT: normal Neck: no JVD, carotid bruits, or masses Cardiac: RRR; no murmurs, rubs, or gallops,no edema  Respiratory:  clear to auscultation bilaterally, normal work of breathing GI: soft, nontender, nondistended, + BS MS: no deformity or atrophy Skin: warm and dry, no rash Neuro:  Strength and sensation are intact, stuttering speech Psych: euthymic mood, full affect   EKG:   The ekg ordered today demonstrates sinus bradycardia, otherwise normal   Recent Labs: 03/22/2014: ALT 12   Lipid Panel    Component Value Date/Time   CHOL 189 03/22/2014  0838   TRIG 180.0* 03/22/2014 0838   HDL 45.50 03/22/2014 0838   CHOLHDL 4 03/22/2014 0838   VLDL 36.0 03/22/2014 0838   LDLCALC 108* 03/22/2014 0838   LDLDIRECT 231.1 04/30/2007 0000     Other studies Reviewed: Additional studies/ records that were reviewed today with results demonstrating: prior cath results as noted above.   ASSESSMENT AND PLAN:  1. CAD: s/p DES x2 in the RCA in 2012. No angina. COntinue risk factor modification. Without medication, his lipids are very high. This runs in his family. I stressed the importance of diet control and exercise. He needs at least 150 minutes per week of cardiovascular exercise. He is doing at least this much walking.  2. HTN: Controlled at home.  He checks rarely.  BP recheck 130/72. 3. Hyperlipidemia: s/p DES x2 in the RCA in 2012. No angina. COntinue risk factor modification. Without medication, his lipids are very high. This runs in his family. I stressed the importance of diet control and exercise. He needs at least 150 minutes per week of cardiovascular exercise. He is doing at least this much walking. WIll check lipids, liver and electrolytes when he is fasting. 4. It would be fine for him to swim for exercise.     Current medicines are reviewed at length with the patient today.  The patient concerns regarding his medicines were addressed.  The following changes have been made:  No change  Labs/ tests ordered today include:  No orders of the defined types were placed in this encounter.    Recommend 150 minutes/week of aerobic exercise Low fat, low carb, high fiber diet recommended  Disposition:   FU in 1 year   Teresita Madura., MD  03/16/2015 2:02 [facility] [place], [place], [place]  [address] Phone: [phone]; Fax: [phone]

## 2015-03-16 NOTE — Patient Instructions (Signed)
**Note De-Identified Emrie Gayle Obfuscation** Medication Instructions:  Same-no changes  Labwork: Lipids and CMET on 03/28/15. Please do not eat or drink after midnight the night before labs are drawn.  Testing/Procedures: None  Follow-Up: Your physician wants you to follow-up in: 1 year. You will receive a reminder letter in the mail two months in advance. If you don't receive a letter, please call our office to schedule the follow-up appointment.      If you need a refill on your cardiac medications before your next appointment, please call your pharmacy.

## 2015-03-28 ENCOUNTER — Other Ambulatory Visit: Payer: Medicare Other

## 2015-03-29 ENCOUNTER — Other Ambulatory Visit (INDEPENDENT_AMBULATORY_CARE_PROVIDER_SITE_OTHER): Payer: Medicare Other | Admitting: *Deleted

## 2015-03-29 DIAGNOSIS — E785 Hyperlipidemia, unspecified: Secondary | ICD-10-CM

## 2015-03-29 LAB — COMPREHENSIVE METABOLIC PANEL
ALK PHOS: 72 U/L (ref 40–115)
ALT: 13 U/L (ref 9–46)
AST: 16 U/L (ref 10–35)
Albumin: 3.9 g/dL (ref 3.6–5.1)
BUN: 8 mg/dL (ref 7–25)
CO2: 29 mmol/L (ref 20–31)
CREATININE: 0.81 mg/dL (ref 0.70–1.25)
Calcium: 9.4 mg/dL (ref 8.6–10.3)
Chloride: 103 mmol/L (ref 98–110)
Glucose, Bld: 96 mg/dL (ref 65–99)
Potassium: 4 mmol/L (ref 3.5–5.3)
SODIUM: 141 mmol/L (ref 135–146)
TOTAL PROTEIN: 6.8 g/dL (ref 6.1–8.1)
Total Bilirubin: 0.5 mg/dL (ref 0.2–1.2)

## 2015-03-29 LAB — LIPID PANEL
CHOLESTEROL: 145 mg/dL (ref 125–200)
HDL: 42 mg/dL (ref >=40)
LDL Cholesterol: 78 mg/dL (ref <130)
Total CHOL/HDL Ratio: 3.5 Ratio (ref <=5.0)
Triglycerides: 126 mg/dL (ref <150)
VLDL: 25 mg/dL (ref <30)

## 2015-04-01 ENCOUNTER — Telehealth: Payer: Self-pay | Admitting: Interventional Cardiology

## 2015-04-01 ENCOUNTER — Encounter: Payer: Self-pay | Admitting: *Deleted

## 2015-04-01 NOTE — Telephone Encounter (Signed)
New message ° ° ° ° °Returning a nurses call to get lab results °

## 2015-04-01 NOTE — Telephone Encounter (Signed)
Informed pt of lab results. Pt verbalized understanding. Pt request a copy be mailed to him. Verified address and placed copy in the mail.

## 2015-05-30 DIAGNOSIS — F8081 Childhood onset fluency disorder: Secondary | ICD-10-CM | POA: Diagnosis not present

## 2015-05-30 DIAGNOSIS — M545 Low back pain: Secondary | ICD-10-CM | POA: Diagnosis not present

## 2015-06-29 DIAGNOSIS — R079 Chest pain, unspecified: Secondary | ICD-10-CM | POA: Diagnosis not present

## 2015-06-29 DIAGNOSIS — J069 Acute upper respiratory infection, unspecified: Secondary | ICD-10-CM | POA: Diagnosis not present

## 2015-08-09 ENCOUNTER — Other Ambulatory Visit: Payer: Self-pay | Admitting: Interventional Cardiology

## 2015-09-20 DIAGNOSIS — Z87891 Personal history of nicotine dependence: Secondary | ICD-10-CM | POA: Diagnosis not present

## 2015-09-20 DIAGNOSIS — M545 Low back pain: Secondary | ICD-10-CM | POA: Diagnosis not present

## 2015-09-20 DIAGNOSIS — I2510 Atherosclerotic heart disease of native coronary artery without angina pectoris: Secondary | ICD-10-CM | POA: Diagnosis not present

## 2015-09-20 DIAGNOSIS — F8081 Childhood onset fluency disorder: Secondary | ICD-10-CM | POA: Diagnosis not present

## 2015-09-20 DIAGNOSIS — I10 Essential (primary) hypertension: Secondary | ICD-10-CM | POA: Diagnosis not present

## 2015-09-20 DIAGNOSIS — Z1211 Encounter for screening for malignant neoplasm of colon: Secondary | ICD-10-CM | POA: Diagnosis not present

## 2015-09-20 DIAGNOSIS — G4700 Insomnia, unspecified: Secondary | ICD-10-CM | POA: Diagnosis not present

## 2015-12-20 DIAGNOSIS — Z1211 Encounter for screening for malignant neoplasm of colon: Secondary | ICD-10-CM | POA: Diagnosis not present

## 2015-12-20 DIAGNOSIS — I10 Essential (primary) hypertension: Secondary | ICD-10-CM | POA: Diagnosis not present

## 2015-12-20 DIAGNOSIS — E7800 Pure hypercholesterolemia, unspecified: Secondary | ICD-10-CM | POA: Diagnosis not present

## 2015-12-20 DIAGNOSIS — F8081 Childhood onset fluency disorder: Secondary | ICD-10-CM | POA: Diagnosis not present

## 2015-12-20 DIAGNOSIS — G4700 Insomnia, unspecified: Secondary | ICD-10-CM | POA: Diagnosis not present

## 2015-12-20 DIAGNOSIS — Z87891 Personal history of nicotine dependence: Secondary | ICD-10-CM | POA: Diagnosis not present

## 2015-12-20 DIAGNOSIS — M545 Low back pain: Secondary | ICD-10-CM | POA: Diagnosis not present

## 2016-01-16 ENCOUNTER — Telehealth: Payer: Self-pay | Admitting: Interventional Cardiology

## 2016-01-16 NOTE — Telephone Encounter (Signed)
Scheduling scheduled the pt an apt for 10/5 at 9 am with Dr Irish Lack.

## 2016-01-16 NOTE — Telephone Encounter (Signed)
New Message   Pt call requesting to speak with a RN about getting a sooner appt than avail with only Provider. Pt states he can only come in the first week of October. Please call back to discuss

## 2016-01-20 ENCOUNTER — Other Ambulatory Visit: Payer: Self-pay | Admitting: Interventional Cardiology

## 2016-02-08 ENCOUNTER — Encounter: Payer: Self-pay | Admitting: Interventional Cardiology

## 2016-02-23 ENCOUNTER — Ambulatory Visit (INDEPENDENT_AMBULATORY_CARE_PROVIDER_SITE_OTHER): Payer: Medicare Other | Admitting: Interventional Cardiology

## 2016-02-23 ENCOUNTER — Encounter: Payer: Self-pay | Admitting: Interventional Cardiology

## 2016-02-23 VITALS — BP 140/90 | HR 50 | Ht 69.0 in | Wt 174.4 lb

## 2016-02-23 DIAGNOSIS — E784 Other hyperlipidemia: Secondary | ICD-10-CM

## 2016-02-23 DIAGNOSIS — I2510 Atherosclerotic heart disease of native coronary artery without angina pectoris: Secondary | ICD-10-CM | POA: Diagnosis not present

## 2016-02-23 DIAGNOSIS — E7849 Other hyperlipidemia: Secondary | ICD-10-CM

## 2016-02-23 DIAGNOSIS — I10 Essential (primary) hypertension: Secondary | ICD-10-CM

## 2016-02-23 DIAGNOSIS — E7800 Pure hypercholesterolemia, unspecified: Secondary | ICD-10-CM

## 2016-02-23 LAB — LIPID PANEL
CHOLESTEROL: 174 mg/dL (ref 125–200)
HDL: 46 mg/dL (ref >=40)
LDL Cholesterol: 99 mg/dL (ref <130)
Total CHOL/HDL Ratio: 3.8 Ratio (ref <=5.0)
Triglycerides: 144 mg/dL (ref <150)
VLDL: 29 mg/dL (ref <30)

## 2016-02-23 LAB — COMPREHENSIVE METABOLIC PANEL
ALK PHOS: 61 U/L (ref 40–115)
ALT: 14 U/L (ref 9–46)
AST: 15 U/L (ref 10–35)
Albumin: 4.2 g/dL (ref 3.6–5.1)
BILIRUBIN TOTAL: 0.6 mg/dL (ref 0.2–1.2)
BUN: 10 mg/dL (ref 7–25)
CO2: 29 mmol/L (ref 20–31)
CREATININE: 0.88 mg/dL (ref 0.70–1.25)
Calcium: 9.3 mg/dL (ref 8.6–10.3)
Chloride: 105 mmol/L (ref 98–110)
Glucose, Bld: 102 mg/dL — ABNORMAL HIGH (ref 65–99)
Potassium: 4.1 mmol/L (ref 3.5–5.3)
SODIUM: 141 mmol/L (ref 135–146)
TOTAL PROTEIN: 6.5 g/dL (ref 6.1–8.1)

## 2016-02-23 NOTE — Progress Notes (Signed)
Patient ID: Anthony Benton, male   DOB: 1951/05/16, 65 y.o.   MRN: [id number]     Cardiology Office Note   Date:  02/23/2016   ID:  Anthony Benton, DOB [birth year], MRN [id number]  PCP:  Anthony Solo, MD    No chief complaint on file. f/u CAD   Wt Readings from Last 3 Encounters:  02/23/16 174 lb 6.4 oz (79.1 kg)  03/16/15 168 lb 12.8 oz (76.6 kg)  03/01/14 166 lb 1.9 oz (75.4 kg)       History of Present Illness: Anthony Benton is a 65 y.o. male  who has a h/o CAD. He had 2 stents to the RCA, 3.5x 15 Xience and 3.5 x 8 Xience in 02/2011 at an outside hospital. He was flown by helicopter but he did not have a heart attack. His initial sx were associated with walking on his long Driveway, which he would do for exercise.  He has high cholesterol. It runs in the family. Mother's cholesterol is over 300.   No recent sx like what he had at the time of the stents.   He walks several miles a day for 6 x/week. No cardiac sx.  He remains actively involved in his church and is now a born again [place].  He has some stress at home.  His sister has stage IV bone cancer- she is getting worse.  His mother is in a nursing home.  THere are now some issues with a possible separation.  THis has added to stress.  The news gives him stress.  WHen he gets stressed, he dies not walk much.  He did not walk a lot in August.  He is back to walking 3-4 mile 3-4x/week      Past Medical History:  Diagnosis Date  . Arthritis    neck, knees; seen Dr. Rhona Benton, Norco 3-4 per day, 14 yrs, stable  . Coronary artery disease   . Hyperlipidemia   . Hypertension   . Insomnia   . OCD (obsessive compulsive disorder)    with anxiety  . Stuttering    uses Diazepam  . Tobacco abuse 1999, quit    Past Surgical History:  Procedure Laterality Date  . [place]  . CARDIAC CATHETERIZATION  03/19/11   stent X 2, in [place]  . CORONARY ANGIOPLASTY  10/12  . VASECTOMY  1983    . WISDOM TOOTH EXTRACTION       Current Outpatient Prescriptions  Medication Sig Dispense Refill  . aspirin 81 MG tablet Take 81 mg by mouth daily.    Marland Kitchen atorvastatin (LIPITOR) 80 MG tablet TAKE ONE TABLET BY MOUTH ONCE DAILY FOR CHOLESTEROL 90 tablet 0  . chlorhexidine (PERIDEX) 0.12 % solution Use as directed 15 mLs in the mouth or throat 2 (two) times daily.    . diazepam (VALIUM) 5 MG tablet Take 5 mg by mouth every 6 (six) hours as needed for anxiety (speech).     Marland Kitchen HYDROcodone-acetaminophen (NORCO) 10-325 MG per tablet Take 1 tablet by mouth every 6 (six) hours as needed (back pain).     . metoprolol succinate (TOPROL-XL) 50 MG 24 hr tablet Take 50 mg by mouth daily.     . Omega-3 Fatty Acids (FISH OIL) 1000 MG CAPS Take 1,000 mg by mouth daily.    . temazepam (RESTORIL) 15 MG capsule Take 15 mg by mouth at bedtime.      No current facility-administered medications for this visit.  Allergies:   Penicillins    Social History:  The patient  reports that he has quit smoking. He has never used smokeless tobacco. He reports that he does not drink alcohol.   Family History:  The patient's family history includes Arthritis in his father and sister; Breast cancer in his sister; Heart attack in his father; Hypertension in his father; Prostate cancer in his father.    ROS:  Please see the history of present illness.   Otherwise, review of systems are positive for emotional stress.   All other systems are reviewed and negative.    PHYSICAL EXAM: VS:  BP 140/90   Pulse (!) 50   Ht 5\' 9"  (1.753 m)   Wt 174 lb 6.4 oz (79.1 kg)   BMI 25.75 kg/m  , BMI Body mass index is 25.75 kg/m. GEN: Well nourished, well developed, in no acute distress  HEENT: normal  Neck: no JVD, carotid bruits, or masses Cardiac: RRR; no murmurs, rubs, or gallops,no edema  Respiratory:  clear to auscultation bilaterally, normal work of breathing GI: soft, nontender, nondistended, + BS MS: no deformity or  atrophy  Skin: warm and dry, no rash Neuro:  Strength and sensation are intact, stuttering speech Psych: euthymic mood, full affect   EKG:   The ekg ordered today demonstrates sinus bradycardia, otherwise , no change from prior   Recent Labs: 03/29/2015: ALT 13; BUN 8; Creat 0.81; Potassium 4.0; Sodium 141   Lipid Panel    Component Value Date/Time   CHOL 145 03/29/2015 0851   TRIG 126 03/29/2015 0851   HDL 42 03/29/2015 0851   CHOLHDL 3.5 03/29/2015 0851   VLDL 25 03/29/2015 0851   LDLCALC 78 03/29/2015 0851   LDLDIRECT 231.1 04/30/2007 0000     Other studies Reviewed: Additional studies/ records that were reviewed today with results demonstrating: prior cath results as noted above.   ASSESSMENT AND PLAN:  1. CAD: s/p DES x2 in the RCA in 2012. No angina. COntinue risk factor modification. Without medication, his lipids are very high. This runs in his family. I stressed the importance of diet control and exercise. He needs at least 150 minutes per week of cardiovascular exercise. He is doing at least this much walking.  2. HTN: Controlled at home.  He checks occasionally.  BP recheck 142/84 today.   We discussed starting a medicine.  He would like to check at home.  Cannot increase metoprolol further due to bradycardia.  3. Hyperlipidemia: s/p DES x2 in the RCA in 2012. No angina. COntinue risk factor modification. Without medication, his lipids are very high. This runs in his family. I stressed the importance of diet control and exercise. He needs at least 150 minutes per week of cardiovascular exercise. He is doing at least this much walking as described above. WIll check lipids, liver and electrolytes today since he is fasting.      Current medicines are reviewed at length with the patient today.  The patient concerns regarding his medicines were addressed.  The following changes have been made:  No change  Labs/ tests ordered today include:  No orders of the  defined types were placed in this encounter.   Recommend 150 minutes/week of aerobic exercise Low fat, low carb, high fiber diet recommended  Disposition:   FU in 1 year   Signed, Anthony Grooms, MD  02/23/2016 9:18 AM    [facility] [address], [place], [place]  [address] Phone: [phone])  [phone]; Fax: [phone]

## 2016-02-23 NOTE — Patient Instructions (Signed)
**Note De-identified Anthony Benton Obfuscation** Medication Instructions:  Same-no changes  Labwork: Today-Lipids and CMET  Testing/Procedures: None  Follow-Up: Your physician wants you to follow-up in: 1 year. You will receive a reminder letter in the mail two months in advance. If you don't receive a letter, please call our office to schedule the follow-up appointment.     If you need a refill on your cardiac medications before your next appointment, please call your pharmacy.   

## 2016-03-05 DIAGNOSIS — E7800 Pure hypercholesterolemia, unspecified: Secondary | ICD-10-CM | POA: Diagnosis not present

## 2016-03-05 DIAGNOSIS — M545 Low back pain: Secondary | ICD-10-CM | POA: Diagnosis not present

## 2016-03-05 DIAGNOSIS — I2510 Atherosclerotic heart disease of native coronary artery without angina pectoris: Secondary | ICD-10-CM | POA: Diagnosis not present

## 2016-03-05 DIAGNOSIS — F8081 Childhood onset fluency disorder: Secondary | ICD-10-CM | POA: Diagnosis not present

## 2016-03-05 DIAGNOSIS — I10 Essential (primary) hypertension: Secondary | ICD-10-CM | POA: Diagnosis not present

## 2016-03-05 DIAGNOSIS — G4700 Insomnia, unspecified: Secondary | ICD-10-CM | POA: Diagnosis not present

## 2016-03-13 DIAGNOSIS — Z23 Encounter for immunization: Secondary | ICD-10-CM | POA: Diagnosis not present

## 2016-04-17 DIAGNOSIS — I10 Essential (primary) hypertension: Secondary | ICD-10-CM | POA: Diagnosis not present

## 2016-04-17 DIAGNOSIS — J0100 Acute maxillary sinusitis, unspecified: Secondary | ICD-10-CM | POA: Diagnosis not present

## 2016-04-17 DIAGNOSIS — L821 Other seborrheic keratosis: Secondary | ICD-10-CM | POA: Diagnosis not present

## 2016-04-17 DIAGNOSIS — C44319 Basal cell carcinoma of skin of other parts of face: Secondary | ICD-10-CM | POA: Diagnosis not present

## 2016-04-20 ENCOUNTER — Other Ambulatory Visit: Payer: Self-pay | Admitting: Interventional Cardiology

## 2016-05-16 DIAGNOSIS — C44319 Basal cell carcinoma of skin of other parts of face: Secondary | ICD-10-CM | POA: Diagnosis not present

## 2016-05-16 DIAGNOSIS — Z85828 Personal history of other malignant neoplasm of skin: Secondary | ICD-10-CM | POA: Diagnosis not present

## 2016-05-24 DIAGNOSIS — F8081 Childhood onset fluency disorder: Secondary | ICD-10-CM | POA: Diagnosis not present

## 2016-05-24 DIAGNOSIS — I10 Essential (primary) hypertension: Secondary | ICD-10-CM | POA: Diagnosis not present

## 2016-05-24 DIAGNOSIS — M545 Low back pain: Secondary | ICD-10-CM | POA: Diagnosis not present

## 2016-05-24 DIAGNOSIS — E7800 Pure hypercholesterolemia, unspecified: Secondary | ICD-10-CM | POA: Diagnosis not present

## 2016-05-24 DIAGNOSIS — I2510 Atherosclerotic heart disease of native coronary artery without angina pectoris: Secondary | ICD-10-CM | POA: Diagnosis not present

## 2016-08-06 DIAGNOSIS — F5101 Primary insomnia: Secondary | ICD-10-CM | POA: Diagnosis not present

## 2016-09-03 DIAGNOSIS — I10 Essential (primary) hypertension: Secondary | ICD-10-CM | POA: Diagnosis not present

## 2016-09-03 DIAGNOSIS — F5101 Primary insomnia: Secondary | ICD-10-CM | POA: Diagnosis not present

## 2016-09-03 DIAGNOSIS — E7800 Pure hypercholesterolemia, unspecified: Secondary | ICD-10-CM | POA: Diagnosis not present

## 2016-09-03 DIAGNOSIS — F8081 Childhood onset fluency disorder: Secondary | ICD-10-CM | POA: Diagnosis not present

## 2016-09-03 DIAGNOSIS — Z1159 Encounter for screening for other viral diseases: Secondary | ICD-10-CM | POA: Diagnosis not present

## 2016-12-26 ENCOUNTER — Ambulatory Visit: Payer: Medicare Other | Admitting: Cardiology

## 2016-12-26 ENCOUNTER — Ambulatory Visit (INDEPENDENT_AMBULATORY_CARE_PROVIDER_SITE_OTHER): Payer: Medicare Other | Admitting: Family Medicine

## 2016-12-26 ENCOUNTER — Encounter: Payer: Self-pay | Admitting: Family Medicine

## 2016-12-26 VITALS — BP 180/100 | HR 61 | Temp 97.9°F | Ht 69.0 in | Wt 158.0 lb

## 2016-12-26 DIAGNOSIS — I10 Essential (primary) hypertension: Secondary | ICD-10-CM

## 2016-12-26 DIAGNOSIS — E785 Hyperlipidemia, unspecified: Secondary | ICD-10-CM

## 2016-12-26 DIAGNOSIS — I259 Chronic ischemic heart disease, unspecified: Secondary | ICD-10-CM

## 2016-12-26 DIAGNOSIS — F411 Generalized anxiety disorder: Secondary | ICD-10-CM

## 2016-12-26 DIAGNOSIS — F4320 Adjustment disorder, unspecified: Secondary | ICD-10-CM

## 2016-12-26 DIAGNOSIS — F4321 Adjustment disorder with depressed mood: Secondary | ICD-10-CM

## 2016-12-26 MED ORDER — DIAZEPAM 5 MG PO TABS: 5.0000 mg | ORAL_TABLET | Freq: Three times a day (TID) | ORAL | 5 refills | Status: DC | PRN

## 2016-12-26 NOTE — Patient Instructions (Signed)
WE NOW OFFER   [facility] FAST TRACK!!!  SAME DAY Appointments for ACUTE CARE  Such as: Sprains, Injuries, cuts, abrasions, rashes, muscle pain, joint pain, back pain Colds, flu, sore throats, headache, allergies, cough, fever  Ear pain, sinus and eye infections Abdominal pain, nausea, vomiting, diarrhea, upset stomach Animal/insect bites  3 Easy Ways to Schedule: Walk-In Scheduling Call in scheduling Mychart Sign-up: [email or web address]         

## 2016-12-26 NOTE — Progress Notes (Signed)
   Subjective:    Patient ID: Anthony Benton., male    DOB: [birth year], 66 y.o.   MRN: [id number]  HPI 65 yr old male to establish with [place]. He had seen Dr. Joni Fears here for years until he moved to [place] in 2010. After living there for 4 years he moved back to [place]. He has been seeing Dr. Kathyrn Lass for primary care until now. He sees Dr. Irish Lack regularly for cardiologic care. He feels well physically but he complains of a lot of emotional stress. He is going through a separation right now from his wife, and he is living with his 9 yr old mother. He is looking for a place of his own. Also his sister died last month of metastatic breast cancer, and he is still grieving over this. He has not had a complete  physical in over 4 years.    Review of Systems  Constitutional: Negative.   Respiratory: Negative.   Cardiovascular: Negative.   Gastrointestinal: Negative.   Genitourinary: Negative.   Neurological: Negative.   Psychiatric/Behavioral: Negative for agitation, confusion, decreased concentration and dysphoric mood. The patient is nervous/anxious.        Objective:   Physical Exam  Constitutional: He is oriented to person, place, and time. He appears well-developed and well-nourished.  HENT:  He stutters when speaking   Cardiovascular: Normal rate, regular rhythm, normal heart sounds and intact distal pulses.   Pulmonary/Chest: [place] normal and breath sounds normal. No respiratory distress. He has no wheezes. He has no rales.  Neurological: He is alert and oriented to person, place, and time.  Psychiatric: His behavior is normal. Thought content normal.  Anxious, almost tearful           Assessment & Plan:  Intro visit for this man with CAD, HTN, hyperlipidemia, and chronic anxiety. We will have him return next week for a complete wellness exam with fasting labs. He sees Dr. Irish Lack in 2 weeks.  Alysia Penna, MD

## 2017-01-02 ENCOUNTER — Ambulatory Visit (INDEPENDENT_AMBULATORY_CARE_PROVIDER_SITE_OTHER): Payer: Medicare Other | Admitting: Family Medicine

## 2017-01-02 ENCOUNTER — Encounter: Payer: Self-pay | Admitting: Family Medicine

## 2017-01-02 VITALS — BP 150/86 | HR 59 | Temp 97.7°F | Ht 69.0 in | Wt 159.0 lb

## 2017-01-02 DIAGNOSIS — I10 Essential (primary) hypertension: Secondary | ICD-10-CM | POA: Diagnosis not present

## 2017-01-02 DIAGNOSIS — F411 Generalized anxiety disorder: Secondary | ICD-10-CM

## 2017-01-02 DIAGNOSIS — I259 Chronic ischemic heart disease, unspecified: Secondary | ICD-10-CM | POA: Diagnosis not present

## 2017-01-02 DIAGNOSIS — Z0000 Encounter for general adult medical examination without abnormal findings: Secondary | ICD-10-CM | POA: Diagnosis not present

## 2017-01-02 DIAGNOSIS — N138 Other obstructive and reflux uropathy: Secondary | ICD-10-CM

## 2017-01-02 DIAGNOSIS — N401 Enlarged prostate with lower urinary tract symptoms: Secondary | ICD-10-CM | POA: Diagnosis not present

## 2017-01-02 DIAGNOSIS — K219 Gastro-esophageal reflux disease without esophagitis: Secondary | ICD-10-CM | POA: Diagnosis not present

## 2017-01-02 DIAGNOSIS — I2510 Atherosclerotic heart disease of native coronary artery without angina pectoris: Secondary | ICD-10-CM

## 2017-01-02 DIAGNOSIS — E782 Mixed hyperlipidemia: Secondary | ICD-10-CM | POA: Diagnosis not present

## 2017-01-02 DIAGNOSIS — R972 Elevated prostate specific antigen [PSA]: Secondary | ICD-10-CM | POA: Diagnosis not present

## 2017-01-02 LAB — CBC WITH DIFFERENTIAL/PLATELET
Basophils Absolute: 0 10*3/uL (ref 0.0–0.1)
Basophils Relative: 0.4 % (ref 0.0–3.0)
EOS PCT: 1.1 % (ref 0.0–5.0)
Eosinophils Absolute: 0.1 10*3/uL (ref 0.0–0.7)
HCT: 42.4 % (ref 39.0–52.0)
Hemoglobin: 14.3 g/dL (ref 13.0–17.0)
LYMPHS ABS: 2 10*3/uL (ref 0.7–4.0)
Lymphocytes Relative: 24.7 % (ref 12.0–46.0)
MCHC: 33.8 g/dL (ref 30.0–36.0)
MCV: 92.8 fl (ref 78.0–100.0)
MONO ABS: 0.6 10*3/uL (ref 0.1–1.0)
MONOS PCT: 7.4 % (ref 3.0–12.0)
NEUTROS ABS: 5.4 10*3/uL (ref 1.4–7.7)
NEUTROS PCT: 66.4 % (ref 43.0–77.0)
PLATELETS: 202 10*3/uL (ref 150.0–400.0)
RBC: 4.57 Mil/uL (ref 4.22–5.81)
RDW: 13.2 % (ref 11.5–15.5)
WBC: 8.1 10*3/uL (ref 4.0–10.5)

## 2017-01-02 LAB — BASIC METABOLIC PANEL
BUN: 9 mg/dL (ref 6–23)
CALCIUM: 10 mg/dL (ref 8.4–10.5)
CHLORIDE: 104 meq/L (ref 96–112)
CO2: 32 meq/L (ref 19–32)
CREATININE: 0.79 mg/dL (ref 0.40–1.50)
GFR: 104.02 mL/min (ref >60.00)
GLUCOSE: 91 mg/dL (ref 70–99)
Potassium: 3.9 mEq/L (ref 3.5–5.1)
Sodium: 142 mEq/L (ref 135–145)

## 2017-01-02 LAB — LIPID PANEL
CHOLESTEROL: 141 mg/dL (ref 0–200)
HDL: 42.2 mg/dL (ref >39.00)
LDL Cholesterol: 69 mg/dL (ref 0–99)
NONHDL: 98.74
Total CHOL/HDL Ratio: 3
Triglycerides: 150 mg/dL — ABNORMAL HIGH (ref 0.0–149.0)
VLDL: 30 mg/dL (ref 0.0–40.0)

## 2017-01-02 LAB — TSH: TSH: 1.02 u[IU]/mL (ref 0.35–4.50)

## 2017-01-02 LAB — HEPATIC FUNCTION PANEL
ALT: 17 U/L (ref 0–53)
AST: 17 U/L (ref 0–37)
Albumin: 4.5 g/dL (ref 3.5–5.2)
Alkaline Phosphatase: 83 U/L (ref 39–117)
BILIRUBIN DIRECT: 0.1 mg/dL (ref 0.0–0.3)
BILIRUBIN TOTAL: 0.8 mg/dL (ref 0.2–1.2)
TOTAL PROTEIN: 6.5 g/dL (ref 6.0–8.3)

## 2017-01-02 LAB — POC URINALSYSI DIPSTICK (AUTOMATED)
BILIRUBIN UA: NEGATIVE
Blood, UA: NEGATIVE
CLARITY UA: NEGATIVE
Glucose, UA: NEGATIVE
KETONES UA: NEGATIVE
LEUKOCYTES UA: NEGATIVE
Nitrite, UA: NEGATIVE
Protein, UA: NEGATIVE
SPEC GRAV UA: 1.01 (ref 1.010–1.025)
Urobilinogen, UA: 0.2 E.U./dL
pH, UA: 6.5 (ref 5.0–8.0)

## 2017-01-02 LAB — PSA: PSA: 14.73 ng/mL — ABNORMAL HIGH (ref 0.10–4.00)

## 2017-01-02 NOTE — Patient Instructions (Signed)
WE NOW OFFER   [facility] FAST TRACK!!!  SAME DAY Appointments for ACUTE CARE  Such as: Sprains, Injuries, cuts, abrasions, rashes, muscle pain, joint pain, back pain Colds, flu, sore throats, headache, allergies, cough, fever  Ear pain, sinus and eye infections Abdominal pain, nausea, vomiting, diarrhea, upset stomach Animal/insect bites  3 Easy Ways to Schedule: Walk-In Scheduling Call in scheduling Mychart Sign-up: [email or web address]         

## 2017-01-02 NOTE — Progress Notes (Signed)
   Subjective:    Patient ID: Anthony Pine., male    DOB: December 14, 1950, 66 y.o.   MRN: [id number]  HPI Here to discuss several issues. His BP remains well controlled at home but it is always high here because gets very nervous. His anxiety is usually stable.    Review of Systems  Constitutional: Negative.   HENT: Negative.   Eyes: Negative.   Respiratory: Negative.   Cardiovascular: Negative.   Gastrointestinal: Negative.   Genitourinary: Negative.   Musculoskeletal: Negative.   Skin: Negative.   Neurological: Negative.   Psychiatric/Behavioral: Negative.        Objective:   Physical Exam  Constitutional: He is oriented to person, place, and time. He appears well-developed and well-nourished. No distress.  HENT:  Head: Normocephalic and atraumatic.  Right Ear: External ear normal.  Left Ear: External ear normal.  Nose: Nose normal.  Mouth/Throat: Oropharynx is clear and moist. No oropharyngeal exudate.  Eyes: Pupils are equal, round, and reactive to light. Conjunctivae and EOM are normal. Right eye exhibits no discharge. Left eye exhibits no discharge. No scleral icterus.  Neck: Neck supple. No JVD present. No tracheal deviation present. No thyromegaly present.  Cardiovascular: Normal rate, regular rhythm, normal heart sounds and intact distal pulses.  Exam reveals no gallop and no friction rub.   No murmur heard. Pulmonary/Chest: [place] normal and breath sounds normal. No respiratory distress. He has no wheezes. He has no rales. He exhibits no tenderness.  Abdominal: Soft. Bowel sounds are normal. He exhibits no distension and no mass. There is no tenderness. There is no rebound and no guarding.  Genitourinary: Rectum normal, prostate normal and penis normal. Rectal exam shows guaiac negative stool. No penile tenderness.  Musculoskeletal: Normal range of motion. He exhibits no edema or tenderness.  Lymphadenopathy:    He has no cervical adenopathy.  Neurological: He is  alert and oriented to person, place, and time. He has normal reflexes. No cranial nerve deficit. He exhibits normal muscle tone. Coordination normal.  Skin: Skin is warm and dry. No rash noted. He is not diaphoretic. No erythema. No pallor.  Psychiatric: He has a normal mood and affect. His behavior is normal. Judgment and thought content normal.          Assessment & Plan:  His HTN is stable. His anxiety is stable. We ill get fasting labs today to check his lipids, among other things. Set up his first colonoscopy.  Alysia Penna, MD

## 2017-01-03 NOTE — Addendum Note (Signed)
Addended by: Alysia Penna A on: 01/03/2017 10:14 PM   Modules accepted: Orders

## 2017-01-04 ENCOUNTER — Telehealth: Payer: Self-pay | Admitting: Family Medicine

## 2017-01-04 NOTE — Telephone Encounter (Signed)
Dr. Sarajane Jews suggest Urology first, it is most important at this time. He can come in office here to discuss labs in further detail if he wishes.

## 2017-01-04 NOTE — Telephone Encounter (Signed)
Anthony Benton pt would like to know which one he should go to first the urologist or the colonoscopy and would like to have the numbers as well.  At the time of the call he was not at a place to receive the information and would like to speak with you to discuss in detail.

## 2017-01-07 ENCOUNTER — Telehealth: Payer: Self-pay | Admitting: Family Medicine

## 2017-01-07 ENCOUNTER — Encounter: Payer: Self-pay | Admitting: Gastroenterology

## 2017-01-07 NOTE — Telephone Encounter (Signed)
Spoke with pt and he c/o pnd/tickle in his throat, some nasal congestion and a cough. He has not taken any OTC medications. Advised him to take CoricidinHBP, saline nasal spray and Delsym for cough. Also advised him to increase fluid intake to help loosen mucus. He has been doing this already. Advised him to contact office if not improving in several days or starts to have additional symptoms such as fever, wheeze, ShOB. Pt voiced understanding to all. Nothing further needed at this time.

## 2017-01-07 NOTE — Telephone Encounter (Signed)
LMTCB

## 2017-01-07 NOTE — Telephone Encounter (Signed)
Pt has a sore throat and wants to know if there is something he can take OTC or could Dr Sarajane Jews call him something in?  Pt declined an appt, stating he can not afford a visit.

## 2017-01-07 NOTE — Telephone Encounter (Signed)
lmomtcb x1 

## 2017-01-08 NOTE — Telephone Encounter (Signed)
I spoke with pt and went over below information. Pt has appointment for Urology in [place], is that okay to wait?

## 2017-01-09 NOTE — Telephone Encounter (Signed)
Yes the September appt would be okay

## 2017-01-09 NOTE — Telephone Encounter (Signed)
I spoke with pt  

## 2017-01-14 NOTE — Progress Notes (Signed)
Cardiology Office Note   Date:  01/15/2017   ID:  Anthony Voorheis., DOB 04-12-1951, MRN [id number]  PCP:  Laurey Morale, MD    No chief complaint on file. CAD   Wt Readings from Last 3 Encounters:  01/15/17 164 lb (74.4 kg)  01/02/17 159 lb (72.1 kg)  12/26/16 158 lb (71.7 kg)       History of Present Illness: Anthony Benton. is a 66 y.o. male  Anthony Benton is a 66 y.o. male  who has a h/o CAD. He had 2 stents to the RCA, 3.5x 15 Xience and 3.5 x 8 Xience in 02/2011 at an outside hospital. He was flown by helicopter but he did not have a heart attack. His initial sx were associated with walking on his long [place], which he would do for exercise.  He has high cholesterol. It runs in the family. Mother's cholesterol is over 300.  He remains actively involved in his church and is now a born again [place].  He has some stress at home.  His sister passed away in 27-Nov-2022 from bone cancer.  His mother is in a nursing home.  He and his wife separated.  THis has added to stress.  He does not walk much anymore.    Denies : Chest pain. Dizziness. Leg edema. Nitroglycerin use. Orthopnea. Palpitations. Paroxysmal nocturnal dyspnea. Shortness of breath. Syncope.   BP high today.       Past Medical History:  Diagnosis Date  . Arthritis    neck, knees; seen Dr. Rhona Raider, [place] 3-4 per day, 14 yrs, stable  . Coronary artery disease   . Hyperlipidemia   . Hypertension   . Insomnia   . OCD (obsessive compulsive disorder)    with anxiety  . Stuttering    uses Diazepam  . Tobacco abuse 1999, quit    Past Surgical History:  Procedure Laterality Date  . [place]  . CARDIAC CATHETERIZATION  03/19/11   stent X 2, in [place]  . CORONARY ANGIOPLASTY  10/12  . VASECTOMY  1983  . WISDOM TOOTH EXTRACTION       Current Outpatient Prescriptions  Medication Sig Dispense Refill  . Acetaminophen (TYLENOL EXTRA STRENGTH PO) Take by mouth as  needed.    Marland Kitchen aspirin 81 MG tablet Take 81 mg by mouth daily.    Marland Kitchen atorvastatin (LIPITOR) 80 MG tablet TAKE ONE TABLET BY MOUTH ONCE DAILY FOR  CHOLESTEROL 90 tablet 3  . diazepam (VALIUM) 5 MG tablet Take 1 tablet (5 mg total) by mouth every 8 (eight) hours as needed for anxiety (speech). 90 tablet 5  . metoprolol succinate (TOPROL-XL) 50 MG 24 hr tablet Take 50 mg by mouth daily.     . Multiple Vitamin (MULTIVITAMIN) tablet Take 1 tablet by mouth daily.    . Omega-3 Fatty Acids (FISH OIL) 1000 MG CAPS Take 1,000 mg by mouth daily.     No current facility-administered medications for this visit.     Allergies:   Penicillins    Social History:  The patient  reports that he has quit smoking. He has never used smokeless tobacco. He reports that he does not drink alcohol.   Family History:  The patient's family history includes Arthritis in his father and sister; Breast cancer in his sister; Heart attack in his father; Hypertension in his father; Prostate cancer in his father.    ROS:  Please see the history of  present illness.   Otherwise, review of systems are positive for sadness from the events above.   All other systems are reviewed and negative.    PHYSICAL EXAM: VS:  BP (!) 174/90   Pulse (!) 53   Ht 5\' 9"  (1.753 m)   Wt 164 lb (74.4 kg)   SpO2 95%   BMI 24.22 kg/m  , BMI Body mass index is 24.22 kg/m. GEN: Well nourished, well developed, in no acute distress  HEENT: normal  Neck: no JVD, carotid bruits, or masses Cardiac: RRR; no murmurs, rubs, or gallops,no edema , 2+ PT pulse Respiratory:  clear to auscultation bilaterally, normal work of breathing GI: soft, nontender, nondistended, + BS MS: no deformity or atrophy  Skin: warm and dry, no rash Neuro:  Strength and sensation are intact Psych: euthymic mood, full affect   EKG:   The ekg ordered today demonstrates SB, PVC, no ST changes   Recent Labs: 01/02/2017: ALT 17; BUN 9; Creatinine, Ser 0.79; Hemoglobin 14.3;  Platelets 202.0; Potassium 3.9; Sodium 142; TSH 1.02   Lipid Panel    Component Value Date/Time   CHOL 141 01/02/2017 1029   TRIG 150.0 (H) 01/02/2017 1029   HDL 42.20 01/02/2017 1029   CHOLHDL 3 01/02/2017 1029   VLDL 30.0 01/02/2017 1029   LDLCALC 69 01/02/2017 1029   LDLDIRECT 231.1 04/30/2007 0000     Other studies Reviewed: Additional studies/ records that were reviewed today with results demonstrating: cath results as noted above.   ASSESSMENT AND PLAN:  1. CAD: No angina on current medical therapy.  Continue aggressive secondary prevention.  H/o DES. Refill Metoprolol ER 50 mg QD. 2. HTN: Elevated reading in the past at MDs office.  At home, readings are in the 301T systolic. COntnue to monitor. 3. Hyperlipidemia:  LDL controlled as well.  COntinue atorvastatin.   Current medicines are reviewed at length with the patient today.  The patient concerns regarding his medicines were addressed.  The following changes have been made:  No change  Labs/ tests ordered today include:  No orders of the defined types were placed in this encounter.   Recommend 150 minutes/week of aerobic exercise Low fat, low carb, high fiber diet recommended  Disposition:   FU in 1 year   Signed, Larae Grooms, MD  01/15/2017 3:01 PM    [facility] [address], [place], [place]  [address] Phone: [phone]; Fax: [phone]

## 2017-01-15 ENCOUNTER — Ambulatory Visit (INDEPENDENT_AMBULATORY_CARE_PROVIDER_SITE_OTHER): Payer: Medicare Other | Admitting: Interventional Cardiology

## 2017-01-15 ENCOUNTER — Encounter (INDEPENDENT_AMBULATORY_CARE_PROVIDER_SITE_OTHER): Payer: Self-pay

## 2017-01-15 ENCOUNTER — Encounter: Payer: Self-pay | Admitting: Interventional Cardiology

## 2017-01-15 VITALS — BP 174/90 | HR 53 | Ht 69.0 in | Wt 164.0 lb

## 2017-01-15 DIAGNOSIS — I25119 Atherosclerotic heart disease of native coronary artery with unspecified angina pectoris: Secondary | ICD-10-CM

## 2017-01-15 DIAGNOSIS — I10 Essential (primary) hypertension: Secondary | ICD-10-CM | POA: Diagnosis not present

## 2017-01-15 DIAGNOSIS — E785 Hyperlipidemia, unspecified: Secondary | ICD-10-CM | POA: Diagnosis not present

## 2017-01-15 DIAGNOSIS — I259 Chronic ischemic heart disease, unspecified: Secondary | ICD-10-CM | POA: Diagnosis not present

## 2017-01-15 DIAGNOSIS — I209 Angina pectoris, unspecified: Secondary | ICD-10-CM

## 2017-01-15 MED ORDER — METOPROLOL SUCCINATE ER 50 MG PO TB24: 50.0000 mg | ORAL_TABLET | Freq: Every day | ORAL | 3 refills | Status: DC

## 2017-01-15 NOTE — Patient Instructions (Signed)

## 2017-02-18 DIAGNOSIS — D225 Melanocytic nevi of trunk: Secondary | ICD-10-CM | POA: Diagnosis not present

## 2017-02-18 DIAGNOSIS — D2239 Melanocytic nevi of other parts of face: Secondary | ICD-10-CM | POA: Diagnosis not present

## 2017-02-18 DIAGNOSIS — L570 Actinic keratosis: Secondary | ICD-10-CM | POA: Diagnosis not present

## 2017-02-18 DIAGNOSIS — Z85828 Personal history of other malignant neoplasm of skin: Secondary | ICD-10-CM | POA: Diagnosis not present

## 2017-02-18 DIAGNOSIS — D2262 Melanocytic nevi of left upper limb, including shoulder: Secondary | ICD-10-CM | POA: Diagnosis not present

## 2017-02-18 DIAGNOSIS — D1801 Hemangioma of skin and subcutaneous tissue: Secondary | ICD-10-CM | POA: Diagnosis not present

## 2017-02-18 DIAGNOSIS — L814 Other melanin hyperpigmentation: Secondary | ICD-10-CM | POA: Diagnosis not present

## 2017-02-18 DIAGNOSIS — D692 Other nonthrombocytopenic purpura: Secondary | ICD-10-CM | POA: Diagnosis not present

## 2017-02-18 DIAGNOSIS — Q800 Ichthyosis vulgaris: Secondary | ICD-10-CM | POA: Diagnosis not present

## 2017-02-18 DIAGNOSIS — L821 Other seborrheic keratosis: Secondary | ICD-10-CM | POA: Diagnosis not present

## 2017-03-11 ENCOUNTER — Other Ambulatory Visit: Payer: Self-pay | Admitting: Interventional Cardiology

## 2017-03-11 MED ORDER — METOPROLOL SUCCINATE ER 50 MG PO TB24: 50.0000 mg | ORAL_TABLET | Freq: Every day | ORAL | 2 refills | Status: DC

## 2017-03-12 ENCOUNTER — Encounter: Payer: Medicare Other | Admitting: Gastroenterology

## 2017-03-14 DIAGNOSIS — R972 Elevated prostate specific antigen [PSA]: Secondary | ICD-10-CM | POA: Diagnosis not present

## 2017-04-09 ENCOUNTER — Encounter: Payer: Medicare Other | Admitting: Gastroenterology

## 2017-05-02 ENCOUNTER — Encounter: Payer: Self-pay | Admitting: Family Medicine

## 2017-05-02 ENCOUNTER — Other Ambulatory Visit: Payer: Self-pay | Admitting: Interventional Cardiology

## 2017-05-02 ENCOUNTER — Ambulatory Visit (INDEPENDENT_AMBULATORY_CARE_PROVIDER_SITE_OTHER): Payer: Medicare Other | Admitting: Family Medicine

## 2017-05-02 VITALS — BP 150/80 | HR 57 | Temp 97.7°F | Wt 167.0 lb

## 2017-05-02 DIAGNOSIS — E538 Deficiency of other specified B group vitamins: Secondary | ICD-10-CM | POA: Diagnosis not present

## 2017-05-02 DIAGNOSIS — R109 Unspecified abdominal pain: Secondary | ICD-10-CM

## 2017-05-02 DIAGNOSIS — I259 Chronic ischemic heart disease, unspecified: Secondary | ICD-10-CM | POA: Diagnosis not present

## 2017-05-02 DIAGNOSIS — F5101 Primary insomnia: Secondary | ICD-10-CM

## 2017-05-02 LAB — POCT URINALYSIS DIPSTICK
Bilirubin, UA: NEGATIVE
Blood, UA: NEGATIVE
Glucose, UA: NEGATIVE
Ketones, UA: NEGATIVE
LEUKOCYTES UA: NEGATIVE
NITRITE UA: NEGATIVE
PH UA: 6 (ref 5.0–8.0)
Protein, UA: NEGATIVE
Spec Grav, UA: 1.02 (ref 1.010–1.025)
UROBILINOGEN UA: 0.2 U/dL

## 2017-05-02 MED ORDER — RANITIDINE HCL 150 MG PO TABS: 150.0000 mg | ORAL_TABLET | Freq: Two times a day (BID) | ORAL | 3 refills | Status: DC

## 2017-05-02 MED ORDER — MIRTAZAPINE 15 MG PO TABS: 15.0000 mg | ORAL_TABLET | Freq: Every day | ORAL | 2 refills | Status: DC

## 2017-05-02 MED ORDER — CYANOCOBALAMIN 1000 MCG/ML IJ SOLN
1000.0000 ug | Freq: Once | INTRAMUSCULAR | Status: AC
  Administered 2017-05-02: 1000 ug via INTRAMUSCULAR

## 2017-05-02 NOTE — Progress Notes (Signed)
   Subjective:    Patient ID: [name]., male    DOB: [birth year], 66 y.o.   MRN: [id number]  HPI Here for one week of intermittent dull achy pains in the central abdomen and the right flank. No fever. No nausea or vomiting. His BMs are normal. No urinary symptoms. Appetite is normal.    Review of Systems  Constitutional: Negative.   Respiratory: Negative.   Cardiovascular: Negative.   Gastrointestinal: Positive for abdominal pain. Negative for abdominal distention, anal bleeding, blood in stool, constipation, diarrhea, nausea, rectal pain and vomiting.  Genitourinary: Negative.        Objective:   Physical Exam  Constitutional: He appears well-developed and well-nourished.  Cardiovascular: Normal rate, regular rhythm, normal heart sounds and intact distal pulses.  Pulmonary/Chest: [place] normal and breath sounds normal. No respiratory distress. He has no wheezes. He has no rales.  Abdominal: Soft. Bowel sounds are normal. He exhibits no distension and no mass. There is no tenderness. There is no rebound and no guarding.          Assessment & Plan:  Possible duodenitis. Try Zantac 150 mg bid. Recheck prn. Alysia Penna, MD

## 2017-05-27 ENCOUNTER — Ambulatory Visit (INDEPENDENT_AMBULATORY_CARE_PROVIDER_SITE_OTHER): Payer: Medicare Other | Admitting: Family Medicine

## 2017-05-27 ENCOUNTER — Encounter: Payer: Self-pay | Admitting: Family Medicine

## 2017-05-27 VITALS — BP 180/100 | HR 68 | Temp 98.3°F | Wt 169.6 lb

## 2017-05-27 DIAGNOSIS — M545 Low back pain, unspecified: Secondary | ICD-10-CM

## 2017-05-27 DIAGNOSIS — I10 Essential (primary) hypertension: Secondary | ICD-10-CM

## 2017-05-27 DIAGNOSIS — E782 Mixed hyperlipidemia: Secondary | ICD-10-CM

## 2017-05-27 DIAGNOSIS — K219 Gastro-esophageal reflux disease without esophagitis: Secondary | ICD-10-CM | POA: Diagnosis not present

## 2017-05-27 LAB — TSH: TSH: 1.23 u[IU]/mL (ref 0.35–4.50)

## 2017-05-27 LAB — CBC WITH DIFFERENTIAL/PLATELET
BASOS ABS: 0 10*3/uL (ref 0.0–0.1)
Basophils Relative: 0.5 % (ref 0.0–3.0)
EOS ABS: 0.1 10*3/uL (ref 0.0–0.7)
Eosinophils Relative: 1.3 % (ref 0.0–5.0)
HEMATOCRIT: 43.5 % (ref 39.0–52.0)
Hemoglobin: 14.5 g/dL (ref 13.0–17.0)
LYMPHS PCT: 29.4 % (ref 12.0–46.0)
Lymphs Abs: 2.2 10*3/uL (ref 0.7–4.0)
MCHC: 33.3 g/dL (ref 30.0–36.0)
MCV: 93.5 fl (ref 78.0–100.0)
Monocytes Absolute: 0.6 10*3/uL (ref 0.1–1.0)
Monocytes Relative: 7.9 % (ref 3.0–12.0)
NEUTROS ABS: 4.5 10*3/uL (ref 1.4–7.7)
Neutrophils Relative %: 60.9 % (ref 43.0–77.0)
PLATELETS: 216 10*3/uL (ref 150.0–400.0)
RBC: 4.65 Mil/uL (ref 4.22–5.81)
RDW: 13.6 % (ref 11.5–15.5)
WBC: 7.4 10*3/uL (ref 4.0–10.5)

## 2017-05-27 LAB — LIPID PANEL
Cholesterol: 182 mg/dL (ref 0–200)
HDL: 52.3 mg/dL (ref >39.00)
LDL Cholesterol: 99 mg/dL (ref 0–99)
NonHDL: 129.25
TRIGLYCERIDES: 149 mg/dL (ref 0.0–149.0)
Total CHOL/HDL Ratio: 3
VLDL: 29.8 mg/dL (ref 0.0–40.0)

## 2017-05-27 LAB — BASIC METABOLIC PANEL
BUN: 11 mg/dL (ref 6–23)
CALCIUM: 9.6 mg/dL (ref 8.4–10.5)
CO2: 29 meq/L (ref 19–32)
Chloride: 104 mEq/L (ref 96–112)
Creatinine, Ser: 0.78 mg/dL (ref 0.40–1.50)
GFR: 105.44 mL/min (ref >60.00)
GLUCOSE: 97 mg/dL (ref 70–99)
Potassium: 4 mEq/L (ref 3.5–5.1)
Sodium: 142 mEq/L (ref 135–145)

## 2017-05-27 LAB — HEPATIC FUNCTION PANEL
ALBUMIN: 4.6 g/dL (ref 3.5–5.2)
ALK PHOS: 77 U/L (ref 39–117)
ALT: 13 U/L (ref 0–53)
AST: 14 U/L (ref 0–37)
Bilirubin, Direct: 0.1 mg/dL (ref 0.0–0.3)
TOTAL PROTEIN: 7 g/dL (ref 6.0–8.3)
Total Bilirubin: 0.6 mg/dL (ref 0.2–1.2)

## 2017-05-27 MED ORDER — DICLOFENAC SODIUM 75 MG PO TBEC
75.0000 mg | DELAYED_RELEASE_TABLET | Freq: Two times a day (BID) | ORAL | 2 refills | Status: DC
Start: 2017-05-27 — End: 2017-06-25

## 2017-05-27 NOTE — Progress Notes (Signed)
   Subjective:    Patient ID: [name]., male    DOB: 04-20-1951, 67 y.o.   MRN: [id number]  HPI Here to follow up. We saw him a few weeks ago for epigastric pains that seemed to be from GERD. He has been taking Zantac bid and this has helped quite a bit. Today he wants to talk about intermittent pains in the back, both middle and lower areas. He gets stiff and he has pain when he moves around much. Tylenol does not help. He is also fasting for labs today.    Review of Systems  Constitutional: Negative.   Respiratory: Negative.   Cardiovascular: Negative.   Gastrointestinal: Negative.   Musculoskeletal: Positive for back pain.       Objective:   Physical Exam  Constitutional: He appears well-developed and well-nourished.  Cardiovascular: Normal rate, regular rhythm, normal heart sounds and intact distal pulses.  Pulmonary/Chest: [place] normal and breath sounds normal. No respiratory distress. He has no wheezes. He has no rales.  Abdominal: Soft. He exhibits no distension and no mass. There is no tenderness. There is no rebound and no guarding.  Musculoskeletal:  Tender along the thoracic and lumbar spines, full ROM           Assessment & Plan:  His GERD is improved on Zantac. He will try Diclofenac for the back pain. Get fasting labs.  Alysia Penna, MD

## 2017-05-29 ENCOUNTER — Telehealth: Payer: Self-pay

## 2017-05-29 MED ORDER — MECLIZINE HCL 25 MG PO TABS: ORAL_TABLET | ORAL | 2 refills | Status: DC

## 2017-05-29 NOTE — Telephone Encounter (Signed)
Started 2 weeks ago.

## 2017-05-29 NOTE — Telephone Encounter (Signed)
Called pt and left a detailed VM advise pt that Rx was sent.

## 2017-05-29 NOTE — Telephone Encounter (Signed)
Pt stated that he forgot to tell you yesterday that for about two weeks he has been dealing with vertigo like symptoms for two weeks now. Pt stated that early in the morning when he wakes up and late at night he will feel dizzy for a few seconds then this side effect will go away. Pt wanted to know what might be causing this and what he should do about it?  Sent to PCP

## 2017-05-29 NOTE — Telephone Encounter (Signed)
This is often a dysfunction of the inner ears. Call in Meclizine 25 mg to take every 4 hours prn dizziness, #60 with 2 rf

## 2017-06-14 ENCOUNTER — Telehealth: Payer: Self-pay | Admitting: Family Medicine

## 2017-06-14 NOTE — Telephone Encounter (Signed)
Copied from [place]. Topic: Quick Communication - See Telephone Encounter >> Jun 14, 2017  4:14 PM Robina Ade, Helene Kelp D wrote: CRM for notification. See Telephone encounter for: 06/14/17. Patient called and said that he has had a fever blister for 2 weeks and wanted to know if Dr. Sarajane Jews would call him something in to his pharmacy [id number] - [place], [place]. Please call patient back, thanks.

## 2017-06-14 NOTE — Telephone Encounter (Signed)
I do not see where we have ever seen pt for this issue.

## 2017-06-18 ENCOUNTER — Ambulatory Visit: Payer: Medicare Other | Admitting: Family Medicine

## 2017-06-18 MED ORDER — VALACYCLOVIR HCL 500 MG PO TABS: 500.0000 mg | ORAL_TABLET | Freq: Two times a day (BID) | ORAL | 2 refills | Status: DC

## 2017-06-18 NOTE — Telephone Encounter (Signed)
°  Relation to RA:QTMA Call back number: [phone]   Reason for call:  Patient received VM and appreciate Rx being sent in, patient cancelled 3:45pm appointment for today.

## 2017-06-18 NOTE — Telephone Encounter (Signed)
Call in Valtrex 500 mg to take bid prn fever blisters, #60 with 2 rf

## 2017-06-18 NOTE — Telephone Encounter (Signed)
Sent to PCP ?

## 2017-06-18 NOTE — Telephone Encounter (Signed)
Rx has been sent  

## 2017-06-25 ENCOUNTER — Encounter: Payer: Self-pay | Admitting: Family Medicine

## 2017-06-25 ENCOUNTER — Ambulatory Visit (INDEPENDENT_AMBULATORY_CARE_PROVIDER_SITE_OTHER): Payer: Medicare Other | Admitting: Family Medicine

## 2017-06-25 VITALS — BP 160/90 | HR 67 | Temp 98.6°F | Wt 170.8 lb

## 2017-06-25 DIAGNOSIS — F5101 Primary insomnia: Secondary | ICD-10-CM

## 2017-06-25 DIAGNOSIS — F411 Generalized anxiety disorder: Secondary | ICD-10-CM | POA: Diagnosis not present

## 2017-06-25 DIAGNOSIS — K130 Diseases of lips: Secondary | ICD-10-CM

## 2017-06-25 MED ORDER — DIAZEPAM 5 MG PO TABS: ORAL_TABLET | ORAL | 5 refills | Status: DC

## 2017-06-25 MED ORDER — TEMAZEPAM 15 MG PO CAPS: 15.0000 mg | ORAL_CAPSULE | Freq: Every evening | ORAL | 2 refills | Status: DC | PRN

## 2017-06-25 NOTE — Progress Notes (Signed)
   Subjective:    Patient ID: Anthony Benton., male    DOB: Oct 29, 1950, 67 y.o.   MRN: [id number]  HPI Here for several issues. First he asks for refills on Valium because he is under stress. He lives with his mother and he and his wife are estranged. He also has trouble sleeping and he wants to try Temazepam again. Also about 3 weeks ago a small lesion appeared on his lower lip and it has grown slightly larger since then. It is not painful, but it does bleed at times. He has tried Abreeva and Valtrex with no benefit.    Review of Systems  Constitutional: Negative.   HENT: Negative.   Eyes: Negative.   Respiratory: Negative.   Cardiovascular: Negative.   Psychiatric/Behavioral: Positive for sleep disturbance. Negative for dysphoric mood. The patient is nervous/anxious.        Objective:   Physical Exam  Constitutional: He is oriented to person, place, and time. He appears well-developed and well-nourished.  HENT:  Right Ear: External ear normal.  Left Ear: External ear normal.  Nose: Nose normal.  The left lower lip has a small papular lesion about 4 mm in diameter   Eyes: Conjunctivae are normal.  Neck: No thyromegaly present.  Cardiovascular: Normal rate, regular rhythm, normal heart sounds and intact distal pulses.  Pulmonary/Chest: Effort normal and breath sounds normal. No respiratory distress. He has no wheezes. He has no rales.  Lymphadenopathy:    He has no cervical adenopathy.  Neurological: He is alert and oriented to person, place, and time.  Psychiatric: His behavior is normal. Thought content normal.  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 We will refer to ENT to remove it. For the anxiety we refilled the Valium. For sleep he will try Temazepam 15 mg.  Alysia Penna, MD

## 2017-06-26 DIAGNOSIS — K130 Diseases of lips: Secondary | ICD-10-CM | POA: Diagnosis not present

## 2017-07-10 DIAGNOSIS — K130 Diseases of lips: Secondary | ICD-10-CM | POA: Diagnosis not present

## 2017-07-17 ENCOUNTER — Telehealth: Payer: Self-pay | Admitting: Family Medicine

## 2017-07-17 NOTE — Telephone Encounter (Signed)
Pt  Is  Requesting   Dr Sarajane Jews  Send  Him   Muscle  Relaxant . Pt  Reports  Low  Back  Pain.   Pt  Reports  Lifting   Objects   While  Moving  Household   Goods last  Week   For  5   Days   - back pain  Began   sev  Days  Later  After  Starting  The  Lifting.  Pt  States  He just  Overdid  It . Pt  States  The  Muscles  In  Back  Feel  Tight.  Denies  Any  Numbness  No  Pain radiating down leg . No loss  Of  Bowel or  Bladder function         Patient   Requesting  Call  Back  [place]   [facility]    [place]

## 2017-07-17 NOTE — Telephone Encounter (Signed)
Please advise 

## 2017-07-17 NOTE — Telephone Encounter (Signed)
Copied from [place] [id number]. Topic: Quick Communication - See Telephone Encounter >> Jul 17, 2017  1:49 PM Sandi Mariscal E, NT wrote: Patient called and said he has been moving and have been experiencing some lower back pain. Pt wanted to see if the doctor could call him a muscle relaxer. Pt would like the nurse to give him a call back and [id number] [place] - [place] [phone] (Phone) [phone] (Fax)    07/17/17.

## 2017-07-17 NOTE — Telephone Encounter (Signed)
Call in Flexeril 10 mg to take every 8 hours prn muscle spasms, #60 with 2 rf  

## 2017-07-18 MED ORDER — CYCLOBENZAPRINE HCL 10 MG PO TABS: 10.0000 mg | ORAL_TABLET | Freq: Three times a day (TID) | ORAL | 2 refills | Status: DC | PRN

## 2017-07-18 NOTE — Telephone Encounter (Signed)
Medication filled to pharmacy as requested. Pt notified of results/instructions and verbalized understanding. 

## 2017-07-26 DIAGNOSIS — L980 Pyogenic granuloma: Secondary | ICD-10-CM | POA: Diagnosis not present

## 2017-07-26 DIAGNOSIS — Z85828 Personal history of other malignant neoplasm of skin: Secondary | ICD-10-CM | POA: Diagnosis not present

## 2017-07-26 DIAGNOSIS — D485 Neoplasm of uncertain behavior of skin: Secondary | ICD-10-CM | POA: Diagnosis not present

## 2017-11-08 ENCOUNTER — Ambulatory Visit (INDEPENDENT_AMBULATORY_CARE_PROVIDER_SITE_OTHER): Payer: Medicare Other

## 2017-11-08 VITALS — BP 156/76 | HR 73 | Ht 69.0 in | Wt 168.0 lb

## 2017-11-08 DIAGNOSIS — Z0000 Encounter for general adult medical examination without abnormal findings: Secondary | ICD-10-CM

## 2017-11-08 NOTE — Progress Notes (Addendum)
Subjective:   Anthony Benton. is a 67 y.o. male who presents for an Initial Medicare Annual Wellness Visit.  Reports health as fair; anxious on arrival   Coronary angioplasty 10/12 Was seeing Dr. Arlee Muslim 05/2017  Going through a separation which he states was 1.68 yo   Meds;  Taking valium x 30 years for stuttering   Diet Chol/hdl 3; hdl 52;  wife cooked He eats but was not specific on what he had   Exercise Was walking 6 miles a day when wife was there  Not now Enjoys Senior games; plays [employer] center   Former smoker - limited in overview of health due to time and his anxiousness   Health Maintenance Due  Topic Date Due  . COLONOSCOPY  02/21/2000   States he had hep c tested already    Colonoscopy overdue since 02/2000;  Did educate regarding the colo-guard and he will consider  Will ask Dr. Sarajane Jews about this    States he had tetanus and pneumonia vaccines   PSA 14.73 12/2016 - the patient was referred to Urology  [facility]  Educated regarding shingrix but he declined   Cardiac Risk Factors include: advanced age (>64men, >22 women);hypertension;family history of premature cardiovascular disease;male gender;dyslipidemia     Objective:    Today's Vitals   11/08/17 1306  BP: (!) 156/76  Pulse: 73  SpO2: 97%  Weight: 168 lb (76.2 kg)  Height: 5\' 9"  (1.753 m)   Body mass index is 24.81 kg/m.  Advanced Directives 11/08/2017  Does Patient Have a Medical Advance Directive? No   Wife and he shared each's HCPOA. She has left and has limited contact with her. States he will try to get in touch with her  Current Medications (verified) Outpatient Encounter Medications as of 11/08/2017  Medication Sig  . Acetaminophen (TYLENOL EXTRA STRENGTH PO) Take by mouth as needed.  Marland Kitchen aspirin 81 MG tablet Take 81 mg by mouth daily.  Marland Kitchen atorvastatin (LIPITOR) 80 MG tablet TAKE 1 TABLET BY MOUTH EVERY DAY FOR CHOLESTEROL  . diazepam (VALIUM) 5  MG tablet Take one tablet every 8 hours as needed for anxiety or speech  . metoprolol succinate (TOPROL-XL) 50 MG 24 hr tablet Take 1 tablet (50 mg total) by mouth daily.  . Multiple Vitamin (MULTIVITAMIN) tablet Take 1 tablet by mouth daily.  . Omega-3 Fatty Acids (FISH OIL) 1000 MG CAPS Take 1,000 mg by mouth daily.  . cyclobenzaprine (FLEXERIL) 10 MG tablet Take 1 tablet (10 mg total) by mouth every 8 (eight) hours as needed for muscle spasms. (Patient not taking: Reported on 11/08/2017)  . ranitidine (ZANTAC) 150 MG tablet Take 1 tablet (150 mg total) by mouth 2 (two) times daily. (Patient not taking: Reported on 11/08/2017)  . temazepam (RESTORIL) 15 MG capsule Take 1 capsule (15 mg total) by mouth at bedtime as needed for sleep. (Patient not taking: Reported on 11/08/2017)  . valACYclovir (VALTREX) 500 MG tablet Take 1 tablet (500 mg total) by mouth 2 (two) times daily. (Patient not taking: Reported on 11/08/2017)   No facility-administered encounter medications on file as of 11/08/2017.    He was very clear regarding his meds  Allergies (verified) Penicillins   History: Past Medical History:  Diagnosis Date  . Arthritis    neck, knees; seen Dr. Rhona Raider, [place] 3-4 per day, 14 yrs, stable  . Coronary artery disease   . Hyperlipidemia   . Hypertension   .  Insomnia   . OCD (obsessive compulsive disorder)    with anxiety  . Stuttering    uses Diazepam  . Tobacco abuse 1999, quit   Past Surgical History:  Procedure Laterality Date  . [place]  . CARDIAC CATHETERIZATION  03/19/11   stent X 2, in [place]  . CORONARY ANGIOPLASTY  10/12  . VASECTOMY  1983  . WISDOM TOOTH EXTRACTION     Family History  Problem Relation Age of Onset  . Arthritis Father   . Hypertension Father   . Prostate cancer Father   . Heart attack Father   . Arthritis Sister   . Breast cancer Sister    Social History   Socioeconomic History  . Marital status: Married    Spouse name:  Not on file  . Number of children: Not on file  . Years of education: Not on file  . Highest education level: Not on file  Occupational History  . Not on file  Social Needs  . Financial resource strain: Not on file  . Food insecurity:    Worry: Not on file    Inability: Not on file  . Transportation needs:    Medical: Not on file    Non-medical: Not on file  Tobacco Use  . Smoking status: Former Research scientist (life sciences)  . Smokeless tobacco: Never Used  Substance and Sexual Activity  . Alcohol use: No    Alcohol/week: 0.0 oz  . Drug use: Not on file  . Sexual activity: Not on file  Lifestyle  . Physical activity:    Days per week: Not on file    Minutes per session: Not on file  . Stress: Not on file  Relationships  . Social connections:    Talks on phone: Not on file    Gets together: Not on file    Attends religious service: Not on file    Active member of club or organization: Not on file    Attends meetings of clubs or organizations: Not on file    Relationship status: Not on file  Other Topics Concern  . Not on file  Social History Narrative  . Not on file   Tobacco Counseling Counseling given: Yes   Clinical Intake:   Activities of Daily Living In your present state of health, do you have any difficulty performing the following activities: 11/08/2017  Hearing? N  Vision? N  Difficulty concentrating or making decisions? N  Comment does stutter and anxiousness prevents tolerating a lot of questions  Walking or climbing stairs? N  Comment was walking 6 miles   Dressing or bathing? N  Doing errands, shopping? N  Preparing Food and eating ? N  Using the Toilet? N  In the past six months, have you accidently leaked urine? N  Do you have problems with loss of bowel control? N  Managing your Medications? N  Managing your Finances? N  Comment states he makes very little; is very frugal with his limited resources   Housekeeping or managing your Housekeeping? N  Some recent  data might be hidden     Immunizations and Health Maintenance  There is no immunization history on file for this patient. Health Maintenance Due  Topic Date Due  . COLONOSCOPY  02/21/2000   Declines   Patient Care Team: Laurey Morale, MD as PCP - General (Family Medicine)  Indicate any recent Medical Services you may have received from other than [facility] providers in the past year (date  may be approximate).    Assessment:   This is a routine wellness examination for Anthony Benton.  Hearing/Vision screen Hearing Screening Comments: No issues Vision Screening Comments: Regular eye exams   Does need to update his eye exam and agreed to do so   Dietary issues and exercise activities discussed: Current Exercise Habits: Home exercise routine(non descript but does [facility] ), Type of exercise: Other - see comments, Intensity: Mild  Goals    . Patient Stated     Would like to start walking again.      Depression Screen PHQ 2/9 Scores 11/08/2017  PHQ - 2 Score 0     Fall Risk Fall Risk  11/08/2017  Falls in the past year? No     Cognitive Function: MMSE - Mini Mental State Exam 11/08/2017  Not completed: (No Data)   Ad8 score reviewed for issues:  Issues making decisions:  Less interest in hobbies / activities:  Repeats questions, stories (family complaining):  Trouble using ordinary gadgets (microwave, computer, phone):  Forgets the month or year:   Mismanaging finances:   Remembering appts:  Daily problems with thinking and/or memory: Ad8 score is=0       Screening Tests Health Maintenance  Topic Date Due  . COLONOSCOPY  02/21/2000  . INFLUENZA VACCINE  01/02/2018 (Originally 12/19/2017)  . TETANUS/TDAP  11/09/2018 (Originally 02/20/1969)  . Hepatitis C Screening  11/09/2018 (Originally 07/19/1950)  . PNA vac Low Risk Adult (2 of 2 - PPSV23) 11/09/2018         Plan:      PCP Notes   Health Maintenance States he had hep c tested  already    Colonoscopy overdue since 02/2000;  Did educate regarding the colo-guard and he will consider  Will ask Dr. Sarajane Jews about this   States he had tetanus and pneumonia vaccines   PSA 14.73 12/2016 - the patient was referred to Urology  [facility]   Educated regarding shingrix but he declined    Abnormal Screens  The patient did not tolerate a lot of questions, so reviewed necessary infor. He asked what I was typing and preferred I did not type. Lives alone. Good understanding of his meds   Referrals  none  Patient concerns; No   Nurse Concerns; Reported completion of most health maintenance  Was very anxious as he had not been here for this type of exam. Will fup for possible colo-guard with Dr. Sarajane Jews. Was not able to confirm status of UR fup for PSA   Next PCP apt [facility] but stated he would schedule after the 1st of July      I have personally reviewed and noted the following in the patient's chart:   . Medical and social history . Use of alcohol, tobacco or illicit drugs  . Current medications and supplements . Functional ability and status . Nutritional status . Physical activity . Advanced directives . List of other physicians . Hospitalizations, surgeries, and ER visits in previous 12 months . Vitals . Screenings to include cognitive, depression, and falls . Referrals and appointments  In addition, I have reviewed and discussed with patient certain preventive protocols, quality metrics, and best practice recommendations. A written personalized care plan for preventive services as well as general preventive health recommendations were provided to patient.     XKGYJ,EHUDJ, RN   11/08/2017   I have read this note and agree with its contents.  Alysia Penna, MD

## 2017-11-08 NOTE — Patient Instructions (Addendum)
Anthony Benton , Thank you for taking time to come for your Medicare Wellness Visit. I appreciate your ongoing commitment to your health goals. Please review the following plan we discussed and let me know if I can assist you in the future.   Will make an apt to discuss with Dr. Laruth Bouchard can have the colo-guard instead of a colonoscopy.  Will discuss with Dr. Sarajane Jews to get opinion.  You need to have your eyes checked;    These are the goals we discussed: Goals    . Patient Stated     Would like to start walking again.       This is a list of the screening recommended for you and due dates:  Health Maintenance  Topic Date Due  . Colon Cancer Screening  02/21/2000  . Flu Shot  01/02/2018*  . Tetanus Vaccine  11/09/2018*  .  Hepatitis C: One time screening is recommended by Center for Disease Control  (CDC) for  adults born from [birth year] through 1965.   11/09/2018*  . Pneumonia vaccines (2 of 2 - PPSV23) 11/09/2018  *Topic was postponed. The date shown is not the original due date.   Prevention of falls: Remove rugs or any tripping hazards in the home Use Non slip mats in bathtubs and showers Placing grab bars next to the toilet and or shower Placing handrails on both sides of the stair way Adding extra lighting in the home.   Personal safety issues reviewed:  1. Consider starting a community watch program per [facility] 2.  Changes batteries is smoke detector and/or carbon monoxide detector  3.  If you have firearms; keep them in a safe place 4.  Wear protection when in the sun; Always wear sunscreen or a hat; It is good to have your doctor check your skin annually or review any new areas of concern 5. Driving safety; Keep in the right lane; stay 3 car lengths behind the car in front of you on the highway; look 3 times prior to pulling out; carry your cell phone everywhere you go!    Health Maintenance, Male A healthy lifestyle and preventive care is important for your  health and wellness. Ask your health care provider about what schedule of regular examinations is right for you. What should I know about weight and diet? Eat a Healthy Diet  Eat plenty of vegetables, fruits, whole grains, low-fat dairy products, and lean protein.  Do not eat a lot of foods high in solid fats, added sugars, or salt.  Maintain a Healthy Weight Regular exercise can help you achieve or maintain a healthy weight. You should:  Do at least 150 minutes of exercise each week. The exercise should increase your heart rate and make you sweat (moderate-intensity exercise).  Do strength-training exercises at least twice a week.  Watch Your Levels of Cholesterol and Blood Lipids  Have your blood tested for lipids and cholesterol every 5 years starting at 68 years of age. If you are at high risk for heart disease, you should start having your blood tested when you are 68 years old. You may need to have your cholesterol levels checked more often if: ? Your lipid or cholesterol levels are high. ? You are older than 68 years of age. ? You are at high risk for heart disease.  What should I know about cancer screening? Many types of cancers can be detected early and may often be prevented. Lung Cancer  You  should be screened every year for lung cancer if: ? You are a current smoker who has smoked for at least 30 years. ? You are a former smoker who has quit within the past 15 years.  Talk to your health care provider about your screening options, when you should start screening, and how often you should be screened.  Colorectal Cancer  Routine colorectal cancer screening usually begins at 68 years of age and should be repeated every 5-10 years until you are 68 years old. You may need to be screened more often if early forms of precancerous polyps or small growths are found. Your health care provider may recommend screening at an earlier age if you have risk factors for colon  cancer.  Your health care provider may recommend using home test kits to check for hidden blood in the stool.  A small camera at the end of a tube can be used to examine your colon (sigmoidoscopy or colonoscopy). This checks for the earliest forms of colorectal cancer.  Prostate and Testicular Cancer  Depending on your age and overall health, your health care provider may do certain tests to screen for prostate and testicular cancer.  Talk to your health care provider about any symptoms or concerns you have about testicular or prostate cancer.  Skin Cancer  Check your skin from head to toe regularly.  Tell your health care provider about any new moles or changes in moles, especially if: ? There is a change in a mole's size, shape, or color. ? You have a mole that is larger than a pencil eraser.  Always use sunscreen. Apply sunscreen liberally and repeat throughout the day.  Protect yourself by wearing long sleeves, pants, a wide-brimmed hat, and sunglasses when outside.  What should I know about heart disease, diabetes, and high blood pressure?  If you are 57-60 years of age, have your blood pressure checked every 3-5 years. If you are 55 years of age or older, have your blood pressure checked every year. You should have your blood pressure measured twice-once when you are at a hospital or clinic, and once when you are not at a hospital or clinic. Record the average of the two measurements. To check your blood pressure when you are not at a hospital or clinic, you can use: ? An automated blood pressure machine at a pharmacy. ? A home blood pressure monitor.  Talk to your health care provider about your target blood pressure.  If you are between 54-58 years old, ask your health care provider if you should take aspirin to prevent heart disease.  Have regular diabetes screenings by checking your fasting blood sugar level. ? If you are at a normal weight and have a low risk for  diabetes, have this test once every three years after the age of 66. ? If you are overweight and have a high risk for diabetes, consider being tested at a younger age or more often.  A one-time screening for abdominal aortic aneurysm (AAA) by ultrasound is recommended for men aged 91-75 years who are current or former smokers. What should I know about preventing infection? Hepatitis B If you have a higher risk for hepatitis B, you should be screened for this virus. Talk with your health care provider to find out if you are at risk for hepatitis B infection. Hepatitis C Blood testing is recommended for:  Everyone born from [birth year] through 1966.  Anyone with known risk factors for hepatitis C.  Sexually  Transmitted Diseases (STDs)  You should be screened each year for STDs including gonorrhea and chlamydia if: ? You are sexually active and are younger than 68 years of age. ? You are older than 69 years of age and your health care provider tells you that you are at risk for this type of infection. ? Your sexual activity has changed since you were last screened and you are at an increased risk for chlamydia or gonorrhea. Ask your health care provider if you are at risk.  Talk with your health care provider about whether you are at high risk of being infected with HIV. Your health care provider may recommend a prescription medicine to help prevent HIV infection.  What else can I do?  Schedule regular health, dental, and eye exams.  Stay current with your vaccines (immunizations).  Do not use any tobacco products, such as cigarettes, chewing tobacco, and e-cigarettes. If you need help quitting, ask your health care provider.  Limit alcohol intake to no more than 2 drinks per day. One drink equals 12 ounces of beer, 5 ounces of wine, or 1 ounces of hard liquor.  Do not use street drugs.  Do not share needles.  Ask your health care provider for help if you need support or information about  quitting drugs.  Tell your health care provider if you often feel depressed.  Tell your health care provider if you have ever been abused or do not feel safe at home. This information is not intended to replace advice given to you by your health care provider. Make sure you discuss any questions you have with your health care provider. Document Released: 11/03/2007 Document Revised: 01/04/2016 Document Reviewed: 02/08/2015 Elsevier Interactive Patient Education  [employer].

## 2017-11-11 ENCOUNTER — Other Ambulatory Visit: Payer: Self-pay | Admitting: Interventional Cardiology

## 2017-11-28 ENCOUNTER — Encounter: Payer: Self-pay | Admitting: Family Medicine

## 2017-11-28 ENCOUNTER — Ambulatory Visit (INDEPENDENT_AMBULATORY_CARE_PROVIDER_SITE_OTHER): Payer: Medicare Other | Admitting: Family Medicine

## 2017-11-28 VITALS — BP 158/98 | HR 61 | Temp 98.1°F | Ht 69.0 in | Wt 171.6 lb

## 2017-11-28 DIAGNOSIS — R42 Dizziness and giddiness: Secondary | ICD-10-CM | POA: Diagnosis not present

## 2017-11-28 MED ORDER — MECLIZINE HCL 25 MG PO TABS: 25.0000 mg | ORAL_TABLET | ORAL | 2 refills | Status: DC | PRN

## 2017-11-28 NOTE — Progress Notes (Signed)
   Subjective:    Patient ID: Anthony Benton., male    DOB: [birth year], 67 y.o.   MRN: [id number]  HPI Here for 2 days of dizziness which makes it feel like the room is spinning when he moves quickly. No headache or fever. He has had some sinus congestion.    Review of Systems  Constitutional: Negative.   HENT: Positive for postnasal drip and sinus pressure. Negative for ear pain, hearing loss, sore throat and tinnitus.   Eyes: Negative.   Respiratory: Negative.   Cardiovascular: Negative.   Neurological: Positive for dizziness. Negative for headaches.       Objective:   Physical Exam  Constitutional: He is oriented to person, place, and time. He appears well-developed and well-nourished.  HENT:  Right Ear: External ear normal.  Left Ear: External ear normal.  Nose: Nose normal.  Mouth/Throat: Oropharynx is clear and moist.  Eyes: Conjunctivae are normal.  Neck: Neck supple.  Cardiovascular: Normal rate, regular rhythm, normal heart sounds and intact distal pulses.  Pulmonary/Chest: Effort normal and breath sounds normal. No stridor. No respiratory distress. He has no wheezes. He has no rales.  Lymphadenopathy:    He has no cervical adenopathy.  Neurological: He is alert and oriented to person, place, and time.          Assessment & Plan:  Vertigo. Drink plenty of water. Take Claritin daily and add Meclizine prn.  Alysia Penna, MD

## 2017-11-29 ENCOUNTER — Ambulatory Visit: Payer: Medicare Other | Admitting: Family Medicine

## 2017-11-29 ENCOUNTER — Telehealth: Payer: Self-pay | Admitting: Family Medicine

## 2017-11-29 NOTE — Telephone Encounter (Signed)
Called and spoke with pt. Pt advised and voiced understanding he stated that he is feeling better with just taking the valium. No need to send in medrol dose pack.

## 2017-11-29 NOTE — Telephone Encounter (Unsigned)
Copied from [place] [id number]. Topic: Quick Communication - See Telephone Encounter >> Nov 29, 2017  9:42 AM Anthony Benton wrote: CRM for notification. See Telephone encounter for: 11/29/17. Pt called in and stated that the meclizine (ANTIVERT) 25 MG tablet [[id number]] is making him sleepy and helping.  He would like to see if Dr Sarajane Jews could suggest something else  [facility] [address]

## 2017-11-29 NOTE — Telephone Encounter (Signed)
Sent to PCP for recordation for another suggestion rather than Antivert

## 2017-11-29 NOTE — Telephone Encounter (Signed)
Called and spoke with pt. Pt stated that the meclizine made him feel very very sleepy. Pt does NOT care to continue this medication. Per verbal OK from Dr. Sarajane Jews advise pt to STOP the meclizine for now and take clartin twice daily and the valium. If he is still feeling the same way it might NOT have been due to the meclizine.   Pt advised and voiced understanding.   Pt stated that a long time ago about 10 years ago Dr. Joni Fears had prescribed something that really worked for him for vertigo but can NOT recall the name of the medication. Sent to PCP to advise if you want to try something else out for his vertigo rather than meclizine or just stick to STOPPING meclizine for now and giving this a try?

## 2017-11-29 NOTE — Telephone Encounter (Signed)
Sometimes I have luck with a steroid taper. Call in a Medrol dose pack

## 2017-11-29 NOTE — Telephone Encounter (Signed)
Take the Meclizine at bedtime only. Increase the Claritin to twice a day. The Valium should help the dizziness as well.

## 2017-12-11 ENCOUNTER — Ambulatory Visit (INDEPENDENT_AMBULATORY_CARE_PROVIDER_SITE_OTHER): Payer: Medicare Other | Admitting: Physician Assistant

## 2017-12-11 ENCOUNTER — Encounter: Payer: Self-pay | Admitting: Physician Assistant

## 2017-12-11 VITALS — BP 180/90 | HR 60 | Ht 69.0 in | Wt 172.0 lb

## 2017-12-11 DIAGNOSIS — I2510 Atherosclerotic heart disease of native coronary artery without angina pectoris: Secondary | ICD-10-CM

## 2017-12-11 DIAGNOSIS — E782 Mixed hyperlipidemia: Secondary | ICD-10-CM

## 2017-12-11 DIAGNOSIS — F411 Generalized anxiety disorder: Secondary | ICD-10-CM

## 2017-12-11 DIAGNOSIS — I10 Essential (primary) hypertension: Secondary | ICD-10-CM

## 2017-12-11 MED ORDER — LISINOPRIL 10 MG PO TABS: 10.0000 mg | ORAL_TABLET | Freq: Every day | ORAL | 3 refills | Status: DC

## 2017-12-11 NOTE — Progress Notes (Signed)
Cardiology Office Note    Date:  12/11/2017   ID:  Anthony Pine., DOB 17-Jun-1950, MRN [id number]  PCP:  Laurey Morale, MD  Cardiologist: Larae Grooms, MD  Chief Complaint  Patient presents with  . Follow-up    History of Present Illness:  Anthony Wickens. is a 67 y.o. male with history of CAD status post DES to the RCA times 06/2010 at outside hospital.  Also has HLD, hypertension.  Saw Dr. Irish Lack 12/2016 which time his blood pressure was high and he was under a lot of stress at home.  Readings were stable at home so no changes were made.  Patient comes in for yearly f/u. Separated from wife, under a lot of stress, says he's homeless. Not eating properly, very depressed. Several friends have died. Staying with different people from church. 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 Denies chest tightness, pressure, dyspnea, dyspnea on exertion, edema or dizziness.BP high. Eats out every meal. Doesn't add salt. Doesn't want to see a psychiatrist or take any more meds.     Past Medical History:  Diagnosis Date  . Arthritis    neck, knees; seen Dr. Rhona Raider, Nor[place] 3-4 per day, 14 yrs, stable  . Coronary artery disease   . Hyperlipidemia   . Hypertension   . Insomnia   . OCD (obsessive compulsive disorder)    with anxiety  . Stuttering    uses Diazepam  . Tobacco abuse 1999, quit    Past Surgical History:  Procedure Laterality Date  . [place]  . CARDIAC CATHETERIZATION  03/19/11   stent X 2, in [place]  . CORONARY ANGIOPLASTY  10/12  . VASECTOMY  1983  . WISDOM TOOTH EXTRACTION      Current Medications: Current Meds  Medication Sig  . Acetaminophen (TYLENOL EXTRA STRENGTH PO) Take by mouth as needed.  Marland Kitchen aspirin 81 MG tablet Take 81 mg by mouth daily.  Marland Kitchen atorvastatin (LIPITOR) 80 MG tablet TAKE 1 TABLET BY MOUTH EVERY DAY FOR CHOLESTEROL  .  diazepam (VALIUM) 5 MG tablet Take one tablet every 8 hours as needed for anxiety or speech  . metoprolol succinate (TOPROL-XL) 50 MG 24 hr tablet TAKE 1 TABLET BY MOUTH EVERY DAY  . Multiple Vitamin (MULTIVITAMIN) tablet Take 1 tablet by mouth daily.  . Omega-3 Fatty Acids (FISH OIL) 1000 MG CAPS Take 1,000 mg by mouth daily.  . temazepam (RESTORIL) 15 MG capsule Take 1 capsule (15 mg total) by mouth at bedtime as needed for sleep.     Allergies:   Penicillins   Social History   Socioeconomic History  . Marital status: Married    Spouse name: Not on file  . Number of children: Not on file  . Years of education: Not on file  . Highest education level: Not on file  Occupational History  . Not on file  Social Needs  . Financial resource strain: Not on file  . Food insecurity:    Worry: Not on file    Inability: Not on file  . Transportation needs:    Medical: Not on file    Non-medical: Not on file  Tobacco Use  . Smoking status: Former Research scientist (life sciences)  . Smokeless tobacco: Never Used  Substance and Sexual Activity  . Alcohol use: No    Alcohol/week: 0.0 oz  . Drug use: Not on file  .  Sexual activity: Not on file  Lifestyle  . Physical activity:    Days per week: Not on file    Minutes per session: Not on file  . Stress: Not on file  Relationships  . Social connections:    Talks on phone: Not on file    Gets together: Not on file    Attends religious service: Not on file    Active member of club or organization: Not on file    Attends meetings of clubs or organizations: Not on file    Relationship status: Not on file  Other Topics Concern  . Not on file  Social History Narrative  . Not on file     Family History:  The patient's family history includes Arthritis in his father and sister; Breast cancer in his sister; Heart attack in his father; Hypertension in his father; Prostate cancer in his father.   ROS:   Please see the history of present illness.    Review of  Systems  Constitution: Negative.  HENT: Negative.   Cardiovascular: Negative.   Respiratory: Negative.   Endocrine: Negative.   Hematologic/Lymphatic: Negative.   Musculoskeletal: Negative.   Gastrointestinal: Negative.   Genitourinary: Negative.   Neurological: Negative.    All other systems reviewed and are negative.   PHYSICAL EXAM:   VS:  BP (!) 180/90   Pulse 60   Ht 5\' 9"  (1.753 m)   Wt 172 lb (78 kg)   SpO2 95%   BMI 25.40 kg/m   Physical Exam  GEN: Well nourished, well developed, in no acute distress   Neck: no JVD, carotid bruits, or masses Cardiac:RRR; S4   Respiratory:  clear to auscultation bilaterally, normal work of breathing GI: soft, nontender, nondistended, + BS Ext: without cyanosis, clubbing, or edema, Good distal pulses bilaterally Neuro:  Alert and Oriented x 3 Psych: euthymic mood, full affect  Wt Readings from Last 3 Encounters:  12/11/17 172 lb (78 kg)  11/28/17 171 lb 9.6 oz (77.8 kg)  11/08/17 168 lb (76.2 kg)      Studies/Labs Reviewed:   EKG:  EKG is  ordered today.  The ekg ordered today demonstrates NSR no EKG  Recent Labs: 05/27/2017: ALT 13; BUN 11; Creatinine, Ser 0.78; Hemoglobin 14.5; Platelets 216.0; Potassium 4.0; Sodium 142; TSH 1.23   Lipid Panel    Component Value Date/Time   CHOL 182 05/27/2017 1247   TRIG 149.0 05/27/2017 1247   HDL 52.30 05/27/2017 1247   CHOLHDL 3 05/27/2017 1247   VLDL 29.8 05/27/2017 1247   LDLCALC 99 05/27/2017 1247   LDLDIRECT 231.1 04/30/2007 0000    Additional studies/ records that were reviewed today include:      ASSESSMENT:    1. Atherosclerosis of native coronary artery of native heart without angina pectoris   2. HYPERTENSION, BENIGN   3. Anxiety state   4. Mixed hyperlipidemia      PLAN:  In order of problems listed above:  CAD status post DES to the RCA times 06/2010 without angina.  Continue aspirin, metoprolol and Lipitor  Hypertension blood pressure is quite high  today.  Patient does not like taking extra medicine but will take lisinopril 10 mg once daily.  Have him come back to the hypertensive clinic in a month to follow-up.  2 g sodium diet  Anxiety depression as well as homelessness.  He refuses psychiatric help.  I asked him to follow-up with Dr. Sarajane Jews concerning and further treatment to help him.  This is been the main focus of his office visit today.  30 minutes spent face-to-face time with him.  Hyperlipidemia LDL was high in January.  Patient says he is taking his Lipitor but he does eat every meal out.  This should be rechecked next office visit.  Medication Adjustments/Labs and Tests Ordered: Current medicines are reviewed at length with the patient today.  Concerns regarding medicines are outlined above.  Medication changes, Labs and Tests ordered today are listed in the Patient Instructions below. Patient Instructions   Medication Instructions:  1. START LISINOPRIL 10 MG DAILY.  Labwork: None ordered  Testing/Procedures: None ordered  Follow-Up: PLEASE COME BACK FOR BLOOD PRESSURE CHECK IN 1 MONTH WITH HYPERTENSION CLINIC  Your physician wants you to follow-up in: 1 year with Dr. Irish Lack. You will receive a reminder letter in the mail two months in advance. If you don't receive a letter, please call our office to schedule the follow-up appointment.   Any Other Special Instructions Will Be Listed Below (If Applicable).     If you need a refill on your cardiac medications before your next appointment, please call your pharmacy.  Two Gram Sodium Diet 2000 mg  What is Sodium? Sodium is a mineral found naturally in many foods. The most significant source of sodium in the diet is table salt, which is about 40% sodium.  Processed, convenience, and preserved foods also contain a large amount of sodium.  The body needs only 500 mg of sodium daily to function,  A normal diet provides more than enough sodium even if you do not use  salt.  Why Limit Sodium? A build up of sodium in the body can cause thirst, increased blood pressure, shortness of breath, and water retention.  Decreasing sodium in the diet can reduce edema and risk of heart attack or stroke associated with high blood pressure.  Keep in mind that there are many other factors involved in these health problems.  Heredity, obesity, lack of exercise, cigarette smoking, stress and what you eat all play a role.  General Guidelines:  Do not add salt at the table or in cooking.  One teaspoon of salt contains over 2 grams of sodium.  Read food labels  Avoid processed and convenience foods  Ask your dietitian before eating any foods not dicussed in the menu planning guidelines  Consult your physician if you wish to use a salt substitute or a sodium containing medication such as antacids.  Limit milk and milk products to 16 oz (2 cups) per day.  Shopping Hints:  READ LABELS!! "Dietetic" does not necessarily mean low sodium.  Salt and other sodium ingredients are often added to foods during processing.   Menu Planning Guidelines Food Group Choose More Often Avoid  Beverages (see also the milk group All fruit juices, low-sodium, salt-free vegetables juices, low-sodium carbonated beverages Regular vegetable or tomato juices, commercially softened water used for drinking or cooking  Breads and Cereals Enriched white, wheat, rye and pumpernickel bread, hard rolls and dinner rolls; muffins, cornbread and waffles; most dry cereals, cooked cereal without added salt; unsalted crackers and breadsticks; low sodium or homemade bread crumbs Bread, rolls and crackers with salted tops; quick breads; instant hot cereals; pancakes; commercial bread stuffing; self-rising flower and biscuit mixes; regular bread crumbs or cracker crumbs  Desserts and Sweets Desserts and sweets mad with mild should be within allowance Instant pudding mixes and cake mixes  Fats Butter or margarine;  vegetable oils; unsalted salad dressings, regular  salad dressings limited to 1 Tbs; light, sour and heavy cream Regular salad dressings containing bacon fat, bacon bits, and salt pork; snack dips made with instant soup mixes or processed cheese; salted nuts  Fruits Most fresh, frozen and canned fruits Fruits processed with salt or sodium-containing ingredient (some dried fruits are processed with sodium sulfites        Vegetables Fresh, frozen vegetables and low- sodium canned vegetables Regular canned vegetables, sauerkraut, pickled vegetables, and others prepared in brine; frozen vegetables in sauces; vegetables seasoned with ham, bacon or salt pork  Condiments, Sauces, Miscellaneous  Salt substitute with physician's approval; pepper, herbs, spices; vinegar, lemon or lime juice; hot pepper sauce; garlic powder, onion powder, low sodium soy sauce (1 Tbs.); low sodium condiments (ketchup, chili sauce, mustard) in limited amounts (1 tsp.) fresh ground horseradish; unsalted tortilla chips, pretzels, potato chips, popcorn, salsa (1/4 cup) Any seasoning made with salt including garlic salt, celery salt, onion salt, and seasoned salt; sea salt, rock salt, kosher salt; meat tenderizers; monosodium glutamate; mustard, regular soy sauce, barbecue, sauce, chili sauce, teriyaki sauce, steak sauce, Worcestershire sauce, and most flavored vinegars; canned gravy and mixes; regular condiments; salted snack foods, olives, picles, relish, horseradish sauce, catsup   Food preparation: Try these seasonings Meats:    Pork Sage, onion Serve with applesauce  Chicken Poultry seasoning, thyme, parsley Serve with cranberry sauce  Lamb Curry powder, rosemary, garlic, thyme Serve with mint sauce or jelly  Veal Marjoram, basil Serve with current jelly, cranberry sauce  Beef Pepper, bay leaf Serve with dry mustard, unsalted chive butter  Fish [place] leaf, dill Serve with unsalted lemon butter, unsalted parsley butter   Vegetables:    Asparagus Lemon juice   Broccoli Lemon juice   Carrots Mustard dressing parsley, mint, nutmeg, glazed with unsalted butter and sugar   Green beans Marjoram, lemon juice, nutmeg,dill seed   Tomatoes Basil, marjoram, onion   Spice /blend for [employer]" 4 tsp ground thyme 1 tsp ground sage 3 tsp ground rosemary 4 tsp ground marjoram   Test your knowledge 1. A product that says "Salt Free" may still contain sodium. True or False 2. Garlic Powder and Hot Pepper Sauce an be used as alternative seasonings.True or False 3. Processed foods have more sodium than fresh foods.  True or False 4. Canned Vegetables have less sodium than froze True or False  WAYS TO DECREASE YOUR SODIUM INTAKE 1. Avoid the use of added salt in cooking and at the table.  Table salt (and other prepared seasonings which contain salt) is probably one of the greatest sources of sodium in the diet.  Unsalted foods can gain flavor from the sweet, sour, and butter taste sensations of herbs and spices.  Instead of using salt for seasoning, try the following seasonings with the foods listed.  Remember: how you use them to enhance natural food flavors is limited only by your creativity... Allspice-Meat, fish, eggs, fruit, peas, red and yellow vegetables Almond Extract-Fruit baked goods Anise Seed-Sweet breads, fruit, carrots, beets, cottage cheese, cookies (tastes like licorice) Basil-Meat, fish, eggs, vegetables, rice, vegetables salads, soups, sauces Bay Leaf-Meat, fish, stews, poultry Burnet-Salad, vegetables (cucumber-like flavor) Caraway Seed-Bread, cookies, cottage cheese, meat, vegetables, cheese, rice Cardamon-Baked goods, fruit, soups Celery Powder or seed-Salads, salad dressings, sauces, meatloaf, soup, bread.Do not use  celery salt Chervil-Meats, salads, fish, eggs, vegetables, cottage cheese (parsley-like flavor) Chili Power-Meatloaf, chicken cheese, corn, eggplant, egg dishes Chives-Salads cottage  cheese, egg dishes, soups, vegetables, sauces Cilantro-Salsa, casseroles  Cinnamon-Baked goods, fruit, pork, lamb, chicken, carrots Cloves-Fruit, baked goods, fish, pot roast, green beans, beets, carrots Coriander-Pastry, cookies, meat, salads, cheese (lemon-orange flavor) Cumin-Meatloaf, fish,cheese, eggs, cabbage,fruit pie (caraway flavor) [employer], fruit, eggs, fish, poultry, cottage cheese, vegetables Dill Seed-Meat, cottage cheese, poultry, vegetables, fish, salads, bread Fennel Seed-Bread, cookies, apples, pork, eggs, fish, beets, cabbage, cheese, Licorice-like flavor Garlic-(buds or powder) Salads, meat, poultry, fish, bread, butter, vegetables, potatoes.Do not  use garlic salt Ginger-Fruit, vegetables, baked goods, meat, fish, poultry Horseradish Root-Meet, vegetables, butter Lemon Juice or Extract-Vegetables, fruit, tea, baked goods, fish salads Mace-Baked goods fruit, vegetables, fish, poultry (taste like nutmeg) Maple Extract-Syrups Marjoram-Meat, chicken, fish, vegetables, breads, green salads (taste like Sage) Mint-Tea, lamb, sherbet, vegetables, desserts, carrots, cabbage Mustard, Dry or Seed-Cheese, eggs, meats, vegetables, poultry Nutmeg-Baked goods, fruit, chicken, eggs, vegetables, desserts Onion Powder-Meat, fish, poultry, vegetables, cheese, eggs, bread, rice salads (Do not use   Onion salt) Orange Extract-Desserts, baked goods Oregano-Pasta, eggs, cheese, onions, pork, lamb, fish, chicken, vegetables, green salads Paprika-Meat, fish, poultry, eggs, cheese, vegetables Parsley Flakes-Butter, vegetables, meat fish, poultry, eggs, bread, salads (certain forms may   Contain sodium Pepper-Meat fish, poultry, vegetables, eggs Peppermint Extract-Desserts, baked goods Poppy Seed-Eggs, bread, cheese, fruit dressings, baked goods, noodles, vegetables, cottage  [employer], poultry, meat, fish, cauliflower, turnips,eggs  bread Saffron-Rice, bread, veal, chicken, fish, eggs Sage-Meat, fish, poultry, onions, eggplant, tomateos, pork, stews Savory-Eggs, salads, poultry, meat, rice, vegetables, soups, pork Tarragon-Meat, poultry, fish, eggs, butter, vegetables (licorice-like flavor)  Thyme-Meat, poultry, fish, eggs, vegetables, (clover-like flavor), sauces, soups Tumeric-Salads, butter, eggs, fish, rice, vegetables (saffron-like flavor) Vanilla Extract-Baked goods, candy Vinegar-Salads, vegetables, meat marinades Walnut Extract-baked goods, candy  2. Choose your Foods Wisely   The following is a list of foods to avoid which are high in sodium:  Meats-Avoid all smoked, canned, salt cured, dried and kosher meat and fish as well as Anchovies   [employer] meats:Bologna, Liverwurst, Pastrami Canned meat or fish  Marinated herring Caviar    Pepperoni Corned Beef   Pizza Dried chipped beef  Salami Frozen breaded fish or meat Salt pork Frankfurters or hot dogs  Sardines Gefilte fish   Sausage Ham (boiled ham, Proscuitto Smoked butt    spiced ham)   Spam      TV Dinners Vegetables Canned vegetables (Regular) Relish Canned mushrooms  Sauerkraut Olives    Tomato juice Pickles  Bakery and Dessert Products Canned puddings  Cream pies Cheesecake   Decorated cakes Cookies  Beverages/Juices Tomato juice, regular  Gatorade   V-8 vegetable juice, regular  Breads and Cereals Biscuit mixes   Salted potato chips, corn chips, pretzels Bread stuffing mixes  Salted crackers and rolls Pancake and waffle mixes Self-rising flour  Seasonings Accent    Meat sauces Barbecue sauce  Meat tenderizer Catsup    Monosodium glutamate (MSG) Celery salt   Onion salt Chili sauce   Prepared mustard Garlic salt   Salt, seasoned salt, sea salt Gravy mixes   Soy sauce Horseradish   Steak sauce Ketchup   Tartar sauce Lite salt    Teriyaki sauce Marinade mixes   Worcestershire sauce  Others Baking  powder   Cocoa and cocoa mixes Baking soda   Commercial casserole mixes Candy-caramels, chocolate  Dehydrated soups    Bars, fudge,nougats  Instant rice and pasta mixes Canned broth or soup  Maraschino cherries Cheese, aged and processed cheese and cheese spreads  Learning Assessment Quiz  Indicated T (for True) or  F (for False) for each of the following statements:  1. _____ Fresh fruits and vegetables and unprocessed grains are generally low in sodium 2. _____ Water may contain a considerable amount of sodium, depending on the source 3. _____ You can always tell if a food is high in sodium by tasting it 4. _____ Certain laxatives my be high in sodium and should be avoided unless prescribed   by a physician or pharmacist 5. _____ Salt substitutes may be used freely by anyone on a sodium restricted diet 6. _____ Sodium is present in table salt, food additives and as a natural component of   most foods 7. _____ Table salt is approximately 90% sodium 8. _____ Limiting sodium intake may help prevent excess fluid accumulation in the body 9. _____ On a sodium-restricted diet, seasonings such as bouillon soy sauce, and    cooking wine should be used in place of table salt 10. _____ On an ingredient list, a product which lists monosodium glutamate as the first   ingredient is an appropriate food to include on a low sodium diet  Circle the best answer(s) to the following statements (Hint: there may be more than one correct answer)  11. On a low-sodium diet, some acceptable snack items are:    A. Olives  F. Bean dip   K. Grapefruit juice    B. Salted Pretzels G. Commercial Popcorn   L. Canned peaches    C. Carrot Sticks  H. Bouillon   M. Unsalted nuts   D. [place] fries  I. Peanut butter crackers N. Salami   E. Sweet pickles J. Tomato Juice   O. Pizza  12.  Seasonings that may be used freely on a reduced - sodium diet include   A. Lemon wedges F.Monosodium glutamate K. Celery  seed    B.Soysauce   G. Pepper   L. Mustard powder   C. Sea salt  H. Cooking wine  M. Onion flakes   D. Vinegar  E. Prepared horseradish N. Salsa   E. Sage   J. Worcestershire sauce  O. Chutney      Signed, Ermalinda Barrios, PA-C  12/11/2017 1:39 PM    [facility] [address], [place], [place]  [address] Phone: [phone]; Fax: [phone]

## 2017-12-11 NOTE — Patient Instructions (Addendum)
Medication Instructions:  1. START LISINOPRIL 10 MG DAILY.  Labwork: None ordered  Testing/Procedures: None ordered  Follow-Up: PLEASE COME BACK FOR BLOOD PRESSURE CHECK IN 1 MONTH WITH HYPERTENSION CLINIC  Your physician wants you to follow-up in: 1 year with Dr. Irish Lack. You will receive a reminder letter in the mail two months in advance. If you don't receive a letter, please call our office to schedule the follow-up appointment.   Any Other Special Instructions Will Be Listed Below (If Applicable).     If you need a refill on your cardiac medications before your next appointment, please call your pharmacy.  Two Gram Sodium Diet 2000 mg  What is Sodium? Sodium is a mineral found naturally in many foods. The most significant source of sodium in the diet is table salt, which is about 40% sodium.  Processed, convenience, and preserved foods also contain a large amount of sodium.  The body needs only 500 mg of sodium daily to function,  A normal diet provides more than enough sodium even if you do not use salt.  Why Limit Sodium? A build up of sodium in the body can cause thirst, increased blood pressure, shortness of breath, and water retention.  Decreasing sodium in the diet can reduce edema and risk of heart attack or stroke associated with high blood pressure.  Keep in mind that there are many other factors involved in these health problems.  Heredity, obesity, lack of exercise, cigarette smoking, stress and what you eat all play a role.  General Guidelines:  Do not add salt at the table or in cooking.  One teaspoon of salt contains over 2 grams of sodium.  Read food labels  Avoid processed and convenience foods  Ask your dietitian before eating any foods not dicussed in the menu planning guidelines  Consult your physician if you wish to use a salt substitute or a sodium containing medication such as antacids.  Limit milk and milk products to 16 oz (2 cups) per  day.  Shopping Hints:  READ LABELS!! "Dietetic" does not necessarily mean low sodium.  Salt and other sodium ingredients are often added to foods during processing.   Menu Planning Guidelines Food Group Choose More Often Avoid  Beverages (see also the milk group All fruit juices, low-sodium, salt-free vegetables juices, low-sodium carbonated beverages Regular vegetable or tomato juices, commercially softened water used for drinking or cooking  Breads and Cereals Enriched white, wheat, rye and pumpernickel bread, hard rolls and dinner rolls; muffins, cornbread and waffles; most dry cereals, cooked cereal without added salt; unsalted crackers and breadsticks; low sodium or homemade bread crumbs Bread, rolls and crackers with salted tops; quick breads; instant hot cereals; pancakes; commercial bread stuffing; self-rising flower and biscuit mixes; regular bread crumbs or cracker crumbs  Desserts and Sweets Desserts and sweets mad with mild should be within allowance Instant pudding mixes and cake mixes  Fats Butter or margarine; vegetable oils; unsalted salad dressings, regular salad dressings limited to 1 Tbs; light, sour and heavy cream Regular salad dressings containing bacon fat, bacon bits, and salt pork; snack dips made with instant soup mixes or processed cheese; salted nuts  Fruits Most fresh, frozen and canned fruits Fruits processed with salt or sodium-containing ingredient (some dried fruits are processed with sodium sulfites        Vegetables Fresh, frozen vegetables and low- sodium canned vegetables Regular canned vegetables, sauerkraut, pickled vegetables, and others prepared in brine; frozen vegetables in sauces; vegetables seasoned with ham,  bacon or salt pork  Condiments, Sauces, Miscellaneous  Salt substitute with physician's approval; pepper, herbs, spices; vinegar, lemon or lime juice; hot pepper sauce; garlic powder, onion powder, low sodium soy sauce (1 Tbs.); low sodium  condiments (ketchup, chili sauce, mustard) in limited amounts (1 tsp.) fresh ground horseradish; unsalted tortilla chips, pretzels, potato chips, popcorn, salsa (1/4 cup) Any seasoning made with salt including garlic salt, celery salt, onion salt, and seasoned salt; sea salt, rock salt, kosher salt; meat tenderizers; monosodium glutamate; mustard, regular soy sauce, barbecue, sauce, chili sauce, teriyaki sauce, steak sauce, Worcestershire sauce, and most flavored vinegars; canned gravy and mixes; regular condiments; salted snack foods, olives, picles, relish, horseradish sauce, catsup   Food preparation: Try these seasonings Meats:    Pork Sage, onion Serve with applesauce  Chicken Poultry seasoning, thyme, parsley Serve with cranberry sauce  Lamb Curry powder, rosemary, garlic, thyme Serve with mint sauce or jelly  Veal Marjoram, basil Serve with current jelly, cranberry sauce  Beef Pepper, bay leaf Serve with dry mustard, unsalted chive butter  Fish [place] leaf, dill Serve with unsalted lemon butter, unsalted parsley butter  Vegetables:    Asparagus Lemon juice   Broccoli Lemon juice   Carrots Mustard dressing parsley, mint, nutmeg, glazed with unsalted butter and sugar   Green beans Marjoram, lemon juice, nutmeg,dill seed   Tomatoes Basil, marjoram, onion   Spice /blend for [employer]" 4 tsp ground thyme 1 tsp ground sage 3 tsp ground rosemary 4 tsp ground marjoram   Test your knowledge 1. A product that says "Salt Free" may still contain sodium. True or False 2. Garlic Powder and Hot Pepper Sauce an be used as alternative seasonings.True or False 3. Processed foods have more sodium than fresh foods.  True or False 4. Canned Vegetables have less sodium than froze True or False  WAYS TO DECREASE YOUR SODIUM INTAKE 1. Avoid the use of added salt in cooking and at the table.  Table salt (and other prepared seasonings which contain salt) is probably one of the greatest sources of sodium in  the diet.  Unsalted foods can gain flavor from the sweet, sour, and butter taste sensations of herbs and spices.  Instead of using salt for seasoning, try the following seasonings with the foods listed.  Remember: how you use them to enhance natural food flavors is limited only by your creativity... Allspice-Meat, fish, eggs, fruit, peas, red and yellow vegetables Almond Extract-Fruit baked goods Anise Seed-Sweet breads, fruit, carrots, beets, cottage cheese, cookies (tastes like licorice) Basil-Meat, fish, eggs, vegetables, rice, vegetables salads, soups, sauces Bay Leaf-Meat, fish, stews, poultry Burnet-Salad, vegetables (cucumber-like flavor) Caraway Seed-Bread, cookies, cottage cheese, meat, vegetables, cheese, rice Cardamon-Baked goods, fruit, soups Celery Powder or seed-Salads, salad dressings, sauces, meatloaf, soup, bread.Do not use  celery salt Chervil-Meats, salads, fish, eggs, vegetables, cottage cheese (parsley-like flavor) Chili Power-Meatloaf, chicken cheese, corn, eggplant, egg dishes Chives-Salads cottage cheese, egg dishes, soups, vegetables, sauces Cilantro-Salsa, casseroles Cinnamon-Baked goods, fruit, pork, lamb, chicken, carrots Cloves-Fruit, baked goods, fish, pot roast, green beans, beets, carrots Coriander-Pastry, cookies, meat, salads, cheese (lemon-orange flavor) Cumin-Meatloaf, fish,cheese, eggs, cabbage,fruit pie (caraway flavor) [employer], fruit, eggs, fish, poultry, cottage cheese, vegetables Dill Seed-Meat, cottage cheese, poultry, vegetables, fish, salads, bread Fennel Seed-Bread, cookies, apples, pork, eggs, fish, beets, cabbage, cheese, Licorice-like flavor Garlic-(buds or powder) Salads, meat, poultry, fish, bread, butter, vegetables, potatoes.Do not  use garlic salt Ginger-Fruit, vegetables, baked goods, meat, fish, poultry Horseradish Root-Meet, vegetables, butter Lemon Juice or Extract-Vegetables,  fruit, tea, baked goods, fish salads Mace-Baked  goods fruit, vegetables, fish, poultry (taste like nutmeg) Maple Extract-Syrups Marjoram-Meat, chicken, fish, vegetables, breads, green salads (taste like Sage) Mint-Tea, lamb, sherbet, vegetables, desserts, carrots, cabbage Mustard, Dry or Seed-Cheese, eggs, meats, vegetables, poultry Nutmeg-Baked goods, fruit, chicken, eggs, vegetables, desserts Onion Powder-Meat, fish, poultry, vegetables, cheese, eggs, bread, rice salads (Do not use   Onion salt) Orange Extract-Desserts, baked goods Oregano-Pasta, eggs, cheese, onions, pork, lamb, fish, chicken, vegetables, green salads Paprika-Meat, fish, poultry, eggs, cheese, vegetables Parsley Flakes-Butter, vegetables, meat fish, poultry, eggs, bread, salads (certain forms may   Contain sodium Pepper-Meat fish, poultry, vegetables, eggs Peppermint Extract-Desserts, baked goods Poppy Seed-Eggs, bread, cheese, fruit dressings, baked goods, noodles, vegetables, cottage  [employer], poultry, meat, fish, cauliflower, turnips,eggs bread Saffron-Rice, bread, veal, chicken, fish, eggs Sage-Meat, fish, poultry, onions, eggplant, tomateos, pork, stews Savory-Eggs, salads, poultry, meat, rice, vegetables, soups, pork Tarragon-Meat, poultry, fish, eggs, butter, vegetables (licorice-like flavor)  Thyme-Meat, poultry, fish, eggs, vegetables, (clover-like flavor), sauces, soups Tumeric-Salads, butter, eggs, fish, rice, vegetables (saffron-like flavor) Vanilla Extract-Baked goods, candy Vinegar-Salads, vegetables, meat marinades Walnut Extract-baked goods, candy  2. Choose your Foods Wisely   The following is a list of foods to avoid which are high in sodium:  Meats-Avoid all smoked, canned, salt cured, dried and kosher meat and fish as well as Anchovies   [employer] [facility] meats:Bologna, Liverwurst, Pastrami Canned meat or fish  Marinated herring Caviar    Pepperoni Corned Beef   Pizza Dried chipped  beef  Salami Frozen breaded fish or meat Salt pork Frankfurters or hot dogs  Sardines Gefilte fish   Sausage Ham (boiled ham, Proscuitto Smoked butt    spiced ham)   Spam      TV Dinners Vegetables Canned vegetables (Regular) Relish Canned mushrooms  Sauerkraut Olives    Tomato juice Pickles  Bakery and Dessert Products Canned puddings  Cream pies Cheesecake   Decorated cakes Cookies  Beverages/Juices Tomato juice, regular  Gatorade   V-8 vegetable juice, regular  Breads and Cereals Biscuit mixes   Salted potato chips, corn chips, pretzels Bread stuffing mixes  Salted crackers and rolls Pancake and waffle mixes Self-rising flour  Seasonings Accent    Meat sauces Barbecue sauce  Meat tenderizer Catsup    Monosodium glutamate (MSG) Celery salt   Onion salt Chili sauce   Prepared mustard Garlic salt   Salt, seasoned salt, sea salt Gravy mixes   Soy sauce Horseradish   Steak sauce Ketchup   Tartar sauce Lite salt    Teriyaki sauce Marinade mixes   Worcestershire sauce  Others Baking powder   Cocoa and cocoa mixes Baking soda   Commercial casserole mixes Candy-caramels, chocolate  Dehydrated soups    Bars, fudge,nougats  Instant rice and pasta mixes Canned broth or soup  Maraschino cherries Cheese, aged and processed cheese and cheese spreads  Learning Assessment Quiz  Indicated T (for True) or F (for False) for each of the following statements:  1. _____ Fresh fruits and vegetables and unprocessed grains are generally low in sodium 2. _____ Water may contain a considerable amount of sodium, depending on the source 3. _____ You can always tell if a food is high in sodium by tasting it 4. _____ Certain laxatives my be high in sodium and should be avoided unless prescribed   by a physician or pharmacist 5. _____ Salt substitutes may be used freely by anyone on a sodium restricted diet 6.  _____ Sodium is present in table salt, food additives and as a natural component  of   most foods 7. _____ Table salt is approximately 90% sodium 8. _____ Limiting sodium intake may help prevent excess fluid accumulation in the body 9. _____ On a sodium-restricted diet, seasonings such as bouillon soy sauce, and    cooking wine should be used in place of table salt 10. _____ On an ingredient list, a product which lists monosodium glutamate as the first   ingredient is an appropriate food to include on a low sodium diet  Circle the best answer(s) to the following statements (Hint: there may be more than one correct answer)  11. On a low-sodium diet, some acceptable snack items are:    A. Olives  F. Bean dip   K. Grapefruit juice    B. Salted Pretzels G. Commercial Popcorn   L. Canned peaches    C. Carrot Sticks  H. Bouillon   M. Unsalted nuts   D. [place] fries  I. Peanut butter crackers N. Salami   E. Sweet pickles J. Tomato Juice   O. Pizza  12.  Seasonings that may be used freely on a reduced - sodium diet include   A. Lemon wedges F.Monosodium glutamate K. Celery seed    B.Soysauce   G. Pepper   L. Mustard powder   C. Sea salt  H. Cooking wine  M. Onion flakes   D. Vinegar  E. Prepared horseradish N. Salsa   E. Sage   J. Worcestershire sauce  O. Chutney

## 2017-12-18 ENCOUNTER — Other Ambulatory Visit: Payer: Self-pay

## 2017-12-23 ENCOUNTER — Ambulatory Visit (INDEPENDENT_AMBULATORY_CARE_PROVIDER_SITE_OTHER): Payer: Medicare Other | Admitting: Family Medicine

## 2017-12-23 ENCOUNTER — Encounter: Payer: Self-pay | Admitting: Family Medicine

## 2017-12-23 VITALS — BP 118/60 | HR 65 | Temp 98.2°F | Ht 69.0 in | Wt 171.4 lb

## 2017-12-23 DIAGNOSIS — I2510 Atherosclerotic heart disease of native coronary artery without angina pectoris: Secondary | ICD-10-CM | POA: Diagnosis not present

## 2017-12-23 DIAGNOSIS — F411 Generalized anxiety disorder: Secondary | ICD-10-CM | POA: Diagnosis not present

## 2017-12-23 MED ORDER — DIAZEPAM 10 MG PO TABS: 10.0000 mg | ORAL_TABLET | Freq: Two times a day (BID) | ORAL | 5 refills | Status: DC | PRN

## 2017-12-23 NOTE — Progress Notes (Signed)
   Subjective:    Patient ID: [name]., male    DOB: Apr 11, 1951, 67 y.o.   MRN: [id number]  HPI Here to follow up on anxiety. He has experienced a lot more anxiety lately than usual. He has been renting a room out from an elderly lady he knows from church, but she recently passed away. He now has 3 weeks to move out. He has no where to go and he is afraid he will be homeless. He has trouble sleeping and he feels anxious during the day. He gets SOB and chest pressure at times.    Review of Systems  Constitutional: Negative.   Respiratory: Negative.   Cardiovascular: Negative.   Neurological: Negative.   Psychiatric/Behavioral: Positive for sleep disturbance. The patient is nervous/anxious.        Objective:   Physical Exam  Constitutional: He is oriented to person, place, and time. He appears well-developed and well-nourished.  Cardiovascular: Normal rate, regular rhythm, normal heart sounds and intact distal pulses.  Pulmonary/Chest: Effort normal and breath sounds normal.  Neurological: He is alert and oriented to person, place, and time.  Psychiatric: His behavior is normal. Thought content normal.  Anxious           Assessment & Plan:  Anxiety, we will increase the Valium to 10 mg bid. Recheck prn. Alysia Penna, MD

## 2017-12-24 ENCOUNTER — Telehealth: Payer: Self-pay | Admitting: Family Medicine

## 2017-12-24 NOTE — Telephone Encounter (Signed)
Sent to PCP to advise 

## 2017-12-24 NOTE — Telephone Encounter (Signed)
Patient states that Dr Sarajane Jews prescribed 10 mg Valium, however the patient needs the 5 mg tablets.  He took the 10 mg tablets and he said they were too strong.  Dr Sarajane Jews told him he could take 2 of the 5 mg if he needs them so he needs that option with the prescription.  He states the prescription needs to say for anxiety and speech.  [facility] [address]  Pt states he usually gets 90 tablets for 3 a day.  Patient wants a call to make sure it is called in.

## 2017-12-27 MED ORDER — DIAZEPAM 5 MG PO TABS: ORAL_TABLET | ORAL | 5 refills | Status: DC

## 2017-12-27 NOTE — Telephone Encounter (Signed)
Called the pharmacy and had the Valium for the 10 MG removed. Called in the prescription for th valium 5 MG.

## 2017-12-27 NOTE — Telephone Encounter (Signed)
Call his pharmacy to cancel the Valoium 10 mg. Instead call in 5 mg to take tid "as needed for anxiety and speech", #[phone] with 5 rf

## 2017-12-28 ENCOUNTER — Encounter

## 2017-12-31 ENCOUNTER — Telehealth: Payer: Self-pay

## 2017-12-31 MED ORDER — DIAZEPAM 5 MG PO TABS: ORAL_TABLET | ORAL | 5 refills | Status: DC

## 2017-12-31 NOTE — Telephone Encounter (Signed)
Needs valium for 5 MG refilled and [facility] [address]

## 2017-12-31 NOTE — Telephone Encounter (Signed)
Pt stated that he has not received his prescription for valium and I called [facility] and they transformer me to [facility] due to pt's insurance.   They stated that the pt's address and phone number has NOT been updated and that they have made not to cancel the prescription.   Spoke with PCP and was given verbal OK to call in Valium to local pharmacy.    Rx has been called into pt's pharmacy [address].   Called pt to advise. Pt advised and voiced understanding.

## 2018-01-21 ENCOUNTER — Telehealth: Payer: Self-pay | Admitting: Family Medicine

## 2018-01-21 NOTE — Telephone Encounter (Signed)
Sent to PCP to advise 

## 2018-01-21 NOTE — Telephone Encounter (Signed)
Called and spoke with pt per Dr.Fry pt can see any other behavorial health doctor within [place] unfortunately Dr. Glennon Hamilton is the only doctor at our site for behavorial health and she is full. Pt advised and voiced understanding. Will try and seek out a doctor within [place] to see.

## 2018-01-21 NOTE — Telephone Encounter (Signed)
Patient states he has decided to see a behavioral Health therapist per Dr Barbie Banner recommendation.  Patient states he really would like to see Dr Glennon Hamilton, however he was told that she is full right now.  He wants to know if Dr Sarajane Jews can get him in to see Dr Glennon Hamilton.  Patient requests a call back from [place].

## 2018-02-18 ENCOUNTER — Other Ambulatory Visit: Payer: Self-pay | Admitting: Interventional Cardiology

## 2018-03-05 DIAGNOSIS — M1611 Unilateral primary osteoarthritis, right hip: Secondary | ICD-10-CM | POA: Diagnosis not present

## 2018-03-05 DIAGNOSIS — M545 Low back pain: Secondary | ICD-10-CM | POA: Diagnosis not present

## 2018-03-05 DIAGNOSIS — M25551 Pain in right hip: Secondary | ICD-10-CM | POA: Diagnosis not present

## 2018-03-05 DIAGNOSIS — M1612 Unilateral primary osteoarthritis, left hip: Secondary | ICD-10-CM | POA: Diagnosis not present

## 2018-03-10 DIAGNOSIS — M1611 Unilateral primary osteoarthritis, right hip: Secondary | ICD-10-CM | POA: Diagnosis not present

## 2018-03-12 ENCOUNTER — Other Ambulatory Visit: Payer: Self-pay | Admitting: Interventional Cardiology

## 2018-04-30 ENCOUNTER — Ambulatory Visit: Payer: Self-pay | Admitting: *Deleted

## 2018-04-30 MED ORDER — AZITHROMYCIN 250 MG PO TABS: ORAL_TABLET | ORAL | 0 refills | Status: DC

## 2018-04-30 NOTE — Telephone Encounter (Signed)
Dr. Fry please advise. Thanks  

## 2018-04-30 NOTE — Telephone Encounter (Signed)
I did call and lmom to make sure of which pharmacy to use.  It stated in the phone message [facility] [address] and [facility] is the pharmacy that the zpak has been sent to.  Nothing further iis needed.

## 2018-04-30 NOTE — Telephone Encounter (Signed)
-----   Message from Vernona Rieger sent at 04/30/2018 12:08 PM EST ----- Pt states that yesterday he started getting a sore throat/ with a stopped up nose/ no fever. He is requesting a zpack.   [id number] [address]  - [place] [address]    [facility] and has been around people with a runny nose and scratchy throat. Pt states for the past two days he has developed Itchy, scratchy, irritated throat, runny nose. Pt denies fever, nausea or other symptoms at this time. Pt denies having any yellow/green colored drainage at this time. Pt states [facility] he was told that they were prescribed an antibiotic and the pt wants to know if an antibiotic could be called in. Attempted to offer home care advice for the pt but he states he has not tried any over the counter medication and it would be cheaper for him to have a z-pak called in than to get something over the counter.Pt states that taking the Z-pak will not hurt Pt states he  had a z pak before but it has been years 6-8 years ago. Pt states his throat is kind of raw and sore. Pt can be contacted at [phone].   Reason for Disposition . Cold with no complications  Answer Assessment - Initial Assessment Questions 1. ONSET: "When did the nasal discharge start?"      2 days ago 2. AMOUNT: "How much discharge is there?"      Runs in the morning for the past 2 whole days like a facuet 3. COUGH: "Do you have a cough?" If yes, ask: "Describe the color of your sputum" (clear, white, yellow, green)     Non productive 4. RESPIRATORY DISTRESS: "Describe your breathing."      No 5. FEVER: "Do you have a fever?" If so, ask: "What is your temperature, how was it measured, and when did it start?"     No 6. SEVERITY: "Overall, how bad are you feeling right now?" (e.g., doesn't interfere with normal activities,  staying home from school/work, staying in bed)      Able to do normal acitivities 7. OTHER SYMPTOMS: "Do you have any other symptoms?" (e.g., sore throat, earache, wheezing, vomiting)     Sore throat 8. PREGNANCY: "Is there any chance you are pregnant?" "When was your last menstrual period?"     n/a  Protocols used: COMMON COLD-A-AH

## 2018-04-30 NOTE — Telephone Encounter (Signed)
Please advise 

## 2018-04-30 NOTE — Telephone Encounter (Signed)
Call in a Zpack  ?

## 2018-04-30 NOTE — Telephone Encounter (Signed)
Attempted to contact pt; left message on voicemail on [phone].

## 2018-05-14 ENCOUNTER — Emergency Department (HOSPITAL_COMMUNITY)
Admission: EM | Admit: 2018-05-14 | Discharge: 2018-05-14 | Disposition: A | Discharge status: Home / Self Care | Payer: Medicare Other | Attending: Emergency Medicine | Admitting: Emergency Medicine

## 2018-05-14 ENCOUNTER — Other Ambulatory Visit: Payer: Self-pay

## 2018-05-14 ENCOUNTER — Encounter (HOSPITAL_COMMUNITY): Payer: Self-pay | Admitting: Emergency Medicine

## 2018-05-14 ENCOUNTER — Emergency Department (HOSPITAL_COMMUNITY): Payer: Medicare Other

## 2018-05-14 DIAGNOSIS — R05 Cough: Secondary | ICD-10-CM | POA: Diagnosis not present

## 2018-05-14 DIAGNOSIS — R059 Cough, unspecified: Secondary | ICD-10-CM

## 2018-05-14 DIAGNOSIS — I2510 Atherosclerotic heart disease of native coronary artery without angina pectoris: Secondary | ICD-10-CM | POA: Insufficient documentation

## 2018-05-14 DIAGNOSIS — I10 Essential (primary) hypertension: Secondary | ICD-10-CM | POA: Insufficient documentation

## 2018-05-14 DIAGNOSIS — R062 Wheezing: Secondary | ICD-10-CM | POA: Diagnosis not present

## 2018-05-14 DIAGNOSIS — Z87891 Personal history of nicotine dependence: Secondary | ICD-10-CM | POA: Diagnosis not present

## 2018-05-14 DIAGNOSIS — Z79899 Other long term (current) drug therapy: Secondary | ICD-10-CM | POA: Diagnosis not present

## 2018-05-14 DIAGNOSIS — Z7982 Long term (current) use of aspirin: Secondary | ICD-10-CM | POA: Insufficient documentation

## 2018-05-14 DIAGNOSIS — E039 Hypothyroidism, unspecified: Secondary | ICD-10-CM | POA: Insufficient documentation

## 2018-05-14 MED ORDER — BENZONATATE 100 MG PO CAPS: 100.0000 mg | ORAL_CAPSULE | Freq: Three times a day (TID) | ORAL | 0 refills | Status: DC

## 2018-05-14 MED ORDER — PREDNISONE 10 MG (21) PO TBPK: ORAL_TABLET | Freq: Every day | ORAL | 0 refills | Status: DC

## 2018-05-14 NOTE — Discharge Instructions (Addendum)
You have been seen today for cough and sore throat. Please read and follow all provided instructions.   1. Medications: prednisone (steroid), tessalon for cough, usual home medications 2. Treatment: rest, drink plenty of fluids 3. Follow Up: Please follow up with your primary doctor in 2 days for discussion of your diagnoses and further evaluation after today's visit; if you do not have a primary care doctor use the resource guide provided to find one; Please return to the ER for any new or worsening symptoms. Please obtain all of your results from medical records or have your doctors office obtain the results - share them with your doctor - you should be seen at your doctors office. Call today to arrange your follow up.   Take medications as prescribed. Please review all of the medicines and only take them if you do not have an allergy to them. Return to the emergency room for worsening condition or new concerning symptoms. Follow up with your regular doctor. If you don't have a regular doctor use one of the numbers below to establish a primary care doctor.  Please be aware that if you are taking birth control pills, taking other prescriptions, ESPECIALLY ANTIBIOTICS may make the birth control ineffective - if this is the case, either do not engage in sexual activity or use alternative methods of birth control such as condoms until you have finished the medicine and your family doctor says it is OK to restart them. If you are on a blood thinner such as COUMADIN, be aware that any other medicine that you take may cause the coumadin to either work too much, or not enough - you should have your coumadin level rechecked in next 7 days if this is the case.  ?  It is also a possibility that you have an allergic reaction to any of the medicines that you have been prescribed - Everybody reacts differently to medications and while MOST people have no trouble with most medicines, you may have a reaction such as  nausea, vomiting, rash, swelling, shortness of breath. If this is the case, please stop taking the medicine immediately and contact your physician.  ?  You should return to the ER if you develop severe or worsening symptoms.   Emergency Department Resource Guide 1) Find a Doctor and Pay Out of Pocket Although you won't have to find out who is covered by your insurance plan, it is a good idea to ask around and get recommendations. You will then need to call the office and see if the doctor you have chosen will accept you as a new patient and what types of options they offer for patients who are self-pay. Some doctors offer discounts or will set up payment plans for their patients who do not have insurance, but you will need to ask so you aren't surprised when you get to your appointment.  2) Contact Your Local Health Department Not all health departments have doctors that can see patients for sick visits, but many do, so it is worth a call to see if yours does. If you don't know where your local health department is, you can check in your phone book. The CDC also has a tool to help you locate your state's health department, and many state websites also have listings of all of their local health departments.  3) Find a [facility] If your illness is not likely to be very severe or complicated, you may want to try a walk in  clinic. These are popping up all over the country in pharmacies, drugstores, and shopping centers. They're usually staffed by nurse practitioners or physician assistants that have been trained to treat common illnesses and complaints. They're usually fairly quick and inexpensive. However, if you have serious medical issues or chronic medical problems, these are probably not your best option.  No Primary Care Doctor: Call Health Connect at  [phone] - they can help you locate a primary care doctor that  accepts your insurance, provides certain services, etc. Physician Referral  [phone]  Emergency Department Resource Guide 1) Find a Doctor and Pay Out of Pocket Although you won't have to find out who is covered by your insurance plan, it is a good idea to ask around and get recommendations. You will then need to call the office and see if the doctor you have chosen will accept you as a new patient and what types of options they offer for patients who are self-pay. Some doctors offer discounts or will set up payment plans for their patients who do not have insurance, but you will need to ask so you aren't surprised when you get to your appointment.  2) Contact Your Local Health Department Not all health departments have doctors that can see patients for sick visits, but many do, so it is worth a call to see if yours does. If you don't know where your local health department is, you can check in your phone book. The CDC also has a tool to help you locate your state's health department, and many state websites also have listings of all of their local health departments.  3) Find a [facility] If your illness is not likely to be very severe or complicated, you may want to try a walk in clinic. These are popping up all over the country in pharmacies, drugstores, and shopping centers. They're usually staffed by nurse practitioners or physician assistants that have been trained to treat common illnesses and complaints. They're usually fairly quick and inexpensive. However, if you have serious medical issues or chronic medical problems, these are probably not your best option.  No Primary Care Doctor: Call Health Connect at  [phone] - they can help you locate a primary care doctor that  accepts your insurance, provides certain services, etc. Physician Referral Service- [phone]  Chronic Pain Problems: Organization         Address  Phone   Notes  [facility]  [phone] Patients need to be referred by their primary care doctor.    Medication Assistance: Organization         Address  Phone   Notes  [facility] [address] [place], [place] [address] [phone] --Must be a resident of [facility] -- Must have NO insurance coverage whatsoever (no Medicaid/ Medicare, etc.) -- The pt. MUST have a primary care doctor that directs their care regularly and follows them in the community   [facility]  [phone]   [employer]  [phone]    Agencies that provide inexpensive medical care: Organization         Address  Phone   Notes  [place]  [phone]   [facility]    [phone]   [facility] [place], [place] [address] [phone]   [place] [address], [place] [phone]   [facility]    (  [phone]   [facility]    [phone]   [facility]  [address], [place] Phone:  [phone], Fax:  [phone] Hours of Operation:  9 am - 6 pm, M-F.  Also accepts Medicaid/Medicare and self-pay.  [facility] for [place] [address], [address], [place] Phone: [phone], Fax: [phone]. Hours of Operation:  8:30 am - 5:30 pm, M-F.  Also accepts Medicaid and self-pay.  [facility] [place] [address], [place] Phone: [phone]   [place], [place], [place] [phone], Ext. [phone] Mondays & Thursdays: 7-9 AM.  First 15 patients are seen on a first come, first serve basis.    [place] Providers:  Organization         Address  Phone   Notes  [facility] [address], [place] [phone] Also accepts self-pay patients.  [facility] [address], [place]  [phone]   [place], [address], [place] [phone]   [facility] [address] [place] [phone]   Lucianne Lei [address], [place]   [phone] Only accepts [place] [facility] [place] patients after they have their name applied to their card.   Self-Pay (no insurance) in [facility]:  Organization         Address  Phone   Notes  Sickle Cell Patients, [facility] [place] [phone]   [facility] [place] [phone]   [facility] [place]  [place], [place], [place] [phone]   [facility]/Dr. Osei-Bonsu  [address], [place] or [address], [place] [phone] Phone number for both [place] and [place] locations is the same.  [facility] [address], [place] [phone]   [facility] [address] [place] or [address] [phone]   [facility] [address] [place] [phone], phone; [phone], fax Sees patients 1st and 3rd Saturday of every month.  Must not qualify for public or private insurance (i.e. Medicaid, Medicare, [place] Health Choice, Veterans' Benefits)  Household income should be no more than 200% of the poverty level The clinic cannot treat you if you are pregnant or think you are pregnant  Sexually transmitted diseases are not treated at the clinic.

## 2018-05-14 NOTE — ED Notes (Signed)
ED Provider at bedside. 

## 2018-05-14 NOTE — ED Triage Notes (Signed)
Patient reports productive cough and sore throat x1 week. Reports completing azithromycin today that was prescribed by PCP.

## 2018-05-14 NOTE — ED Provider Notes (Signed)
[facility] DEPT Provider Note   CSN: [id number] Arrival date & time: 05/14/18  1247     History   Chief Complaint Chief Complaint  Patient presents with  . Cough  . Sore Throat    HPI Anthony Benton. is a 67 y.o. male with a PMH of CAD, HTN, and HLD presenting with a constant productive cough onset 1.5 weeks ago. Patient states symptoms completely resolved for a few days and symptoms started to worsen 5 days ago. Patient reports he was prescribed azithromycin by PCP and symptoms did not improve. Patient states he has tried cough drops for his symptoms without relief. Patient reports associated sore throat, rhinorrhea, and congestion. Patient denies fever, abdominal pain, vomiting, diarrhea, body aches, shortness of breath, or chest pain. Patient reports sick contacts at church.   HPI  Past Medical History:  Diagnosis Date  . Arthritis    neck, knees; seen Dr. Rhona Raider, [place] 3-4 per day, 14 yrs, stable  . Coronary artery disease   . Hyperlipidemia   . Hypertension   . Insomnia   . OCD (obsessive compulsive disorder)    with anxiety  . Stuttering    uses Diazepam  . Tobacco abuse 1999, quit    Patient Active Problem List   Diagnosis Date Noted  . B12 deficiency 05/02/2017  . Chronic ischemic heart disease, unspecified 04/21/2013  . Coronary atherosclerosis of native coronary artery 04/21/2013  . Anxiety state 05/25/2008  . HYPOTHYROIDISM 04/30/2007  . Hyperlipidemia 04/30/2007  . HYPERTENSION, BENIGN 04/30/2007  . GERD 04/30/2007  . DEGENERATIVE JOINT DISEASE, 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/2007  . LOW BACK PAIN 01/24/2007    Past Surgical History:  Procedure Laterality Date  . [place]  . CARDIAC CATHETERIZATION  03/19/11   stent X 2, in [place]  . CORONARY ANGIOPLASTY  10/12  . VASECTOMY  1983  . WISDOM TOOTH EXTRACTION          Home Medications    Prior to Admission medications   Medication Sig Start Date  End Date Taking? Authorizing Provider  Acetaminophen (TYLENOL EXTRA STRENGTH PO) Take 500 mg by mouth daily as needed (pain).    Yes [provider]  aspirin 81 MG tablet Take 81 mg by mouth daily.   Yes [provider]  atorvastatin (LIPITOR) 80 MG tablet TAKE 1 TABLET BY MOUTH EVERY DAY 03/12/18  Yes Jettie Booze, MD  azithromycin (ZITHROMAX) 250 MG tablet Take 2 tablets by mouth today, then 1 daily until gone. 04/30/18  Yes Laurey Morale, MD  diazepam (VALIUM) 5 MG tablet Take three times daily as needed for anxiety and speech Patient taking differently: Take 5 mg by mouth 3 (three) times daily. anxiety and speech 12/31/17  Yes Laurey Morale, MD  lisinopril (PRINIVIL,ZESTRIL) 10 MG tablet Take 10 mg by mouth daily.   Yes [provider]  metoprolol succinate (TOPROL-XL) 50 MG 24 hr tablet TAKE 1 TABLET BY MOUTH EVERY DAY 02/18/18  Yes Jettie Booze, MD  Multiple Vitamin (MULTIVITAMIN) tablet Take 1 tablet by mouth daily.   Yes [provider]  Omega-3 Fatty Acids (FISH OIL) 1000 MG CAPS Take 1,000 mg by mouth daily.   Yes [provider]  benzonatate (TESSALON) 100 MG capsule Take 1 capsule (100 mg total) by mouth every 8 (eight) hours. 05/14/18   Darlin Drop P, PA-C  lisinopril (PRINIVIL,ZESTRIL) 10 MG tablet Take 1 tablet (10 mg total) by mouth daily. For  Hypertension 12/11/17 03/11/18  Imogene Burn, PA-C  predniSONE (STERAPRED UNI-PAK 21 TAB) 10 MG (21) TBPK tablet Take by mouth daily. Take 6 tabs by mouth daily for 2 days, then 5 tabs for 2 days, then 4 tabs for 2 days, then 3 tabs for 2 days, 2 tabs for 2 days, then 1 tab by mouth daily for 2 days 05/14/18   Darlin Drop P, PA-C  temazepam (RESTORIL) 15 MG capsule Take 1 capsule (15 mg total) by mouth at bedtime as needed for sleep. Patient not taking: Reported on 05/14/2018 06/25/17   Laurey Morale, MD    Family History Family History  Problem Relation Age of Onset  .  Arthritis Father   . Hypertension Father   . Prostate cancer Father   . Heart attack Father   . Arthritis Sister   . Breast cancer Sister     Social History Social History   Tobacco Use  . Smoking status: Former Research scientist (life sciences)  . Smokeless tobacco: Never Used  Substance Use Topics  . Alcohol use: No    Alcohol/week: 0.0 standard drinks  . Drug use: Not on file     Allergies   Penicillins   Review of Systems Review of Systems  Constitutional: Negative for chills, diaphoresis and fever.  HENT: Positive for congestion, rhinorrhea and sore throat. Negative for trouble swallowing.   Respiratory: Positive for wheezing. Negative for cough and shortness of breath.   Cardiovascular: Negative for chest pain and leg swelling.  Gastrointestinal: Negative for abdominal pain, diarrhea, nausea and vomiting.  Endocrine: Negative for cold intolerance and heat intolerance.  Genitourinary: Negative for dysuria.  Musculoskeletal: Negative for back pain and myalgias.  Skin: Negative for rash.  Allergic/Immunologic: Negative for immunocompromised state.  Hematological: Negative for adenopathy.     Physical Exam Updated Vital Signs BP (!) 181/97 (BP Location: Right Arm)   Pulse 75   Temp 98.1 F (36.7 C) (Oral)   Resp 15   SpO2 98%   Physical Exam Vitals signs and nursing note reviewed.  Constitutional:      General: He is not in acute distress.    Appearance: He is well-developed. He is not diaphoretic.  HENT:     Head: Normocephalic and atraumatic.     Right Ear: Tympanic membrane and ear canal normal.     Left Ear: Tympanic membrane and ear canal normal.     Nose: Congestion and rhinorrhea present.     Mouth/Throat:     Mouth: Mucous membranes are moist.     Pharynx: Uvula midline. No pharyngeal swelling or posterior oropharyngeal erythema.  Eyes:     Conjunctiva/sclera: Conjunctivae normal.  Neck:     Musculoskeletal: Normal range of motion.  Cardiovascular:     Rate and  Rhythm: Normal rate and regular rhythm.     Heart sounds: Normal heart sounds. No murmur. No friction rub. No gallop.   Pulmonary:     Effort: Pulmonary effort is normal. No respiratory distress.     Breath sounds: Normal breath sounds. No wheezing or rales.  Abdominal:     Palpations: Abdomen is soft.     Tenderness: There is no abdominal tenderness.  Musculoskeletal: Normal range of motion.  Skin:    General: Skin is warm.     Findings: No erythema or rash.  Neurological:     Mental Status: He is alert and oriented to person, place, and time.    ED Treatments / Results  Labs (all labs ordered  are listed, but only abnormal results are displayed) Labs Reviewed - No data to display  EKG None  Radiology Dg Chest 2 View  Result Date: 05/14/2018 CLINICAL DATA:  Cough and sore throat. EXAM: CHEST - 2 VIEW COMPARISON:  None. FINDINGS: The heart size and mediastinal contours are within normal limits. There is no evidence of pulmonary edema, consolidation, pneumothorax, nodule or pleural fluid. The thoracic spine demonstrates mild degenerative disc disease. IMPRESSION: No active cardiopulmonary disease. Electronically Signed   By: Aletta Edouard M.D.   On: 05/14/2018 13:34    Procedures Procedures (including critical care time)  Medications Ordered in ED Medications - No data to display   Initial Impression / Assessment and Plan / ED Course  I have reviewed the triage vital signs and the nursing notes.  Pertinent labs & imaging results that were available during my care of the patient were reviewed by me and considered in my medical decision making (see chart for details).  Clinical Course as of May 14 1418  Wed May 14, 2018  1416 No active cardiopulmonary disease noted on CXR.  DG Chest 2 View [AH]    Clinical Course User Index [AH] Arville Lime, [place]   Patient presents with URI type symptoms.  Patient is nontoxic appearing, in no apparent distress, vitals are WNL.  Patient is afebrile in the ED, lungs are CTA, CXR negative for infiltrate, doubt pneumonia. There is no wheezing or signs of respiratory distress. Pt is afebrile, no sinus tenderness, doubt acute bacterial sinusitis. Centor score -1, doubt strep pharyngitis. No evidence of AOM on exam. No meningeal signs. No history components or rashes to raise concern for tic borne illness. Suspect viral etiology at this time and will treat supportively with prednisone and Tessalon. I discussed results, treatment plan, need for PCP follow-up, and return precautions with the patient. Provided opportunity for questions, patient confirmed understanding and is in agreement with plan.   Findings and plan of care discussed with supervising physician Dr. Zenia Resides.  Final Clinical Impressions(s) / ED Diagnoses   Final diagnoses:  Cough    ED Discharge Orders         Ordered    predniSONE (STERAPRED UNI-PAK 21 TAB) 10 MG (21) TBPK tablet  Daily     05/14/18 1416    benzonatate (TESSALON) 100 MG capsule  Every 8 hours     05/14/18 1416           Arville Lime, [place] 05/14/18 1419    Lacretia Leigh, MD 05/17/18 1527

## 2018-06-03 ENCOUNTER — Ambulatory Visit (INDEPENDENT_AMBULATORY_CARE_PROVIDER_SITE_OTHER): Payer: Medicare HMO | Admitting: Family Medicine

## 2018-06-03 ENCOUNTER — Telehealth: Payer: Self-pay | Admitting: Family Medicine

## 2018-06-03 ENCOUNTER — Encounter: Payer: Self-pay | Admitting: Family Medicine

## 2018-06-03 VITALS — BP 122/64 | HR 70 | Temp 97.5°F | Wt 165.4 lb

## 2018-06-03 DIAGNOSIS — J209 Acute bronchitis, unspecified: Secondary | ICD-10-CM | POA: Diagnosis not present

## 2018-06-03 MED ORDER — CLARITHROMYCIN 500 MG PO TABS: 500.0000 mg | ORAL_TABLET | Freq: Two times a day (BID) | ORAL | 0 refills | Status: DC

## 2018-06-03 MED ORDER — HYDROCODONE-HOMATROPINE 5-1.5 MG/5ML PO SYRP: 5.0000 mL | ORAL_SOLUTION | ORAL | 0 refills | Status: DC | PRN

## 2018-06-03 NOTE — Telephone Encounter (Signed)
I called and spoke with the pharmacy and they are aware of Dr. Barbie Banner recs to stop the chol med for 2 weeks while taking the abx and he will start this back once he is done with the abx.  The pt is aware of this as well.   I called the patient and he is wanting to know if Dr. Sarajane Jews can send in another abx that will not interact with his chol med.  He stated that he does not want to stop taking the chol med.  He stated that since he had to change insurance this year, the medication is expensive for him.  He may end up getting these meds changed to [facility] [address] in [place]. Dr. Sarajane Jews please advise. Thanks

## 2018-06-03 NOTE — Telephone Encounter (Signed)
[facility] called and said that Clarithromycin ordered today interacts with the patient's ordered cholesterol medicine, Atorvastatin. Please call pharmacy at [phone].

## 2018-06-03 NOTE — Telephone Encounter (Signed)
He should take the antibiotic but he can stop the cholesterol medication for 2 weeks and then start back after that

## 2018-06-03 NOTE — Telephone Encounter (Signed)
Copied from [place] [phone]. Topic: Quick Communication - Rx Refill/Question >> Jun 03, 2018  9:44 AM Anthony Benton wrote: Medication: clarithromycin (BIAXIN) 500 MG tablet  Has the patient contacted their pharmacy? Yes.   (Agent: If no, request that the patient contact the pharmacy for the refill.) (Agent: If yes, when and what did the pharmacy advise?)  [facility] ([facility] phone [facility] [id number]  [facility] - said that this medication above interacts with cholesterol medicine  Please call pharm at [phone]   Agent: Please be advised that RX refills may take up to 3 business days. We ask that you follow-up with your pharmacy.

## 2018-06-03 NOTE — Progress Notes (Signed)
   Subjective:    Patient ID: Anthony Benton., male    DOB: 09-14-1950, 68 y.o.   MRN: [id number]  HPI Here for an ER follow up from 05-14-18 for a dry cough. On that day his exam was unremarkable and his CXR was clear. He was given Prednisone and Benznatate, but these did not help. The cough has now persisted for over 4 weeks. It is dry and originates in the upper chest. No fever or sinus congestion. Using Delsym.    Review of Systems  Constitutional: Negative.   HENT: Negative.   Eyes: Negative.   Respiratory: Positive for cough and wheezing.   Cardiovascular: Negative.        Objective:   Physical Exam Constitutional:      Appearance: Normal appearance.  HENT:     Right Ear: Tympanic membrane and ear canal normal.     Left Ear: Tympanic membrane and ear canal normal.     Nose: Nose normal.     Mouth/Throat:     Pharynx: Oropharynx is clear.  Eyes:     Conjunctiva/sclera: Conjunctivae normal.  Cardiovascular:     Rate and Rhythm: Normal rate and regular rhythm.     Pulses: Normal pulses.     Heart sounds: Normal heart sounds.  Pulmonary:     Effort: Pulmonary effort is normal. No respiratory distress.     Breath sounds: No stridor. No rhonchi or rales.     Comments: Soft scattered wheezes  Lymphadenopathy:     Cervical: No cervical adenopathy.  Neurological:     Mental Status: He is alert.           Assessment & Plan:  Atypical bronchitis, treat with Biaxin. Use Hydromet for cough.  Alysia Penna, MD

## 2018-06-03 NOTE — Telephone Encounter (Signed)
Dr. Sarajane Jews please advise on interactions between chol med and the clarithromycin.  Thanks

## 2018-06-04 ENCOUNTER — Telehealth: Payer: Self-pay | Admitting: Family Medicine

## 2018-06-04 NOTE — Telephone Encounter (Signed)
Actually I really want him to take the Biaxin I prescribed because this would be the best one to fight the infection he has

## 2018-06-04 NOTE — Telephone Encounter (Signed)
Dr. Fry please advise 

## 2018-06-04 NOTE — Telephone Encounter (Signed)
Copied from [place] [phone]. Topic: Quick Communication - Rx Refill/Question >> Jun 04, 2018 12:15 PM [place], [place] D wrote: Medication: HYDROcodone-homatropine (HYDROMET) 5-1.5 MG/5ML syrup  / clarithromycin (BIAXIN) 500 MG tablet / Pt stated that his insurance will not cover these rx's. Pt needs alternative medications that insurance will cover. Stated he spoke to Dr. Barbie Banner assistant and told her this but he did not hear anything back. Please advise and reach out to pt.  Has the patient contacted their pharmacy? Yes.   (Agent: If no, request that the patient contact the pharmacy for the refill.) (Agent: If yes, when and what did the pharmacy advise?)  [facility]erred [facility] (with phone number [id number] [place] [place] [phone] (Phone) [phone] (Fax)  Agent: Please be advised that RX refills may take up to 3 business days. We ask that you follow-up with your pharmacy.

## 2018-06-05 ENCOUNTER — Other Ambulatory Visit: Payer: Self-pay | Admitting: Adult Health

## 2018-06-05 MED ORDER — DOXYCYCLINE HYCLATE 100 MG PO TABS: 100.0000 mg | ORAL_TABLET | Freq: Two times a day (BID) | ORAL | 0 refills | Status: DC

## 2018-06-05 MED ORDER — HYDROCODONE-HOMATROPINE 5-1.5 MG/5ML PO SYRP: 5.0000 mL | ORAL_SOLUTION | Freq: Three times a day (TID) | ORAL | 0 refills | Status: DC | PRN

## 2018-06-05 NOTE — Telephone Encounter (Signed)
Cancel the Hydromet and the Biaxin. Call in Doxycycline 100 mg bid for 10 days. The only thing he can take for the cough, it looks like, is Delsym

## 2018-06-05 NOTE — Telephone Encounter (Signed)
Pt returned call to [facility] but was advised she is not in office this afternoon. He then came to the office in person. He is upset that his medication issues have not been resolved.   He states Delsym does not work and does not control his cough. He has taken almost a whole bottle already. He would like the rx cough medication Dr Sarajane Jews told him he could have. He states his cost for the large bottle (225mL) was too much - he would like to have a smaller bottle to take.  Tommi Rumps advised he would send this in. Pt advised.   [place]!

## 2018-06-05 NOTE — Telephone Encounter (Signed)
Per his wishes. It has been sent in for 120 ml

## 2018-06-05 NOTE — Telephone Encounter (Signed)
Anthony Benton has been sent to the pharmacy along with a note to not fill the hydromet or the biaxin.  I have called the pt and left a VM to advise him of this.

## 2018-07-28 ENCOUNTER — Other Ambulatory Visit: Payer: Self-pay | Admitting: Family Medicine

## 2018-07-28 NOTE — Telephone Encounter (Signed)
Dr. Fry please advise on refill of medication.  Thanks  

## 2018-07-30 ENCOUNTER — Other Ambulatory Visit: Payer: Self-pay | Admitting: Family Medicine

## 2018-07-30 NOTE — Telephone Encounter (Signed)
Refill called to the pharmacy and left on the VM for refills.

## 2018-07-30 NOTE — Telephone Encounter (Signed)
Call in #[phone] with 5 rf 

## 2018-08-21 ENCOUNTER — Other Ambulatory Visit: Payer: Self-pay | Admitting: Interventional Cardiology

## 2018-08-29 ENCOUNTER — Ambulatory Visit: Payer: Self-pay

## 2018-08-29 NOTE — Telephone Encounter (Signed)
Pt called to say the room is spinning. He states that last year he had these same symptoms and was evaluated by Dr Sarajane Jews. He states the symptoms are coming and going. He has no symptoms as we speak. He is sitting and attempted to get up and feels the vertigo come on.  He States that he puts his head down and he gets vertigo. He has had a sore left ear. He states he has a bad disc disc in his neck. He is eating. He denies weakness or numbness on one side. Per protocol pt needs evaluation. Pt does not use e-mail.  Pt advised to seek help over the weekend if symptoms progress. As we were talking pt continues to have Vertigo symptoms come and go. We decided that since he lives alone to call 911 to get him to the hospital for evaluation. 911 was called pt has unlocked the door and is waiting for them now. EMS arrived.  Reason for Disposition . [1] Dizziness (vertigo) present now AND [2] one or more stroke risk factors (i.e., hypertension, diabetes, prior stroke/TIA/heart attack)  (Exception: prior physician evaluation for this AND no different/worse than usual)  Answer Assessment - Initial Assessment Questions 1. DESCRIPTION: "Describe your dizziness."     Spinning room 2. VERTIGO: "Do you feel like either you or the room is spinning or tilting?"     spinning 3. LIGHTHEADED: "Do you feel lightheaded?" (e.g., somewhat faint, woozy, weak upon standing)     no 4. SEVERITY: "How bad is it?"  "Can you walk?"   - MILD - Feels unsteady but walking normally.   - MODERATE - Feels very unsteady when walking, but not falling; interferes with normal activities (e.g., school, work) .   - SEVERE - Unable to walk without falling (requires assistance).     steady 5. ONSET:  "When did the dizziness begin?"     This AM 6. AGGRAVATING FACTORS: "Does anything make it worse?" (e.g., standing, change in head position)    Changing positions 7. CAUSE: "What do you think is causing the dizziness?"     unsure 8. RECURRENT  SYMPTOM: "Have you had dizziness before?" If so, ask: "When was the last time?" "What happened that time?"    I year ago 9. OTHER SYMPTOMS: "Do you have any other symptoms?" (e.g., headache, weakness, numbness, vomiting, earache)     Left ear sore, not eating well, 10. PREGNANCY: "Is there any chance you are pregnant?" "When was your last menstrual period?"     N/A  Protocols used: DIZZINESS - VERTIGO-A-AH

## 2018-09-01 NOTE — Telephone Encounter (Signed)
Called and spoke with pt and he stated that he is having a lot of anxiety and he will call back later this week if he feels that he needs an appt.

## 2018-09-01 NOTE — Telephone Encounter (Signed)
FYI

## 2018-10-20 ENCOUNTER — Telehealth: Payer: Self-pay

## 2018-10-20 NOTE — Telephone Encounter (Signed)
lvm for pt to call back awv needs to be rescheduled to July crm created

## 2018-10-21 NOTE — Telephone Encounter (Signed)
Appt has been canceled.

## 2018-10-21 NOTE — Telephone Encounter (Signed)
Pt called and stated that he would like to just cancel appointment for now. Please advise

## 2018-10-31 ENCOUNTER — Other Ambulatory Visit: Payer: Self-pay | Admitting: Interventional Cardiology

## 2018-10-31 ENCOUNTER — Other Ambulatory Visit: Payer: Self-pay | Admitting: Physician Assistant

## 2018-11-10 ENCOUNTER — Ambulatory Visit: Payer: Medicare HMO

## 2018-12-02 ENCOUNTER — Other Ambulatory Visit: Payer: Self-pay

## 2018-12-02 ENCOUNTER — Ambulatory Visit (INDEPENDENT_AMBULATORY_CARE_PROVIDER_SITE_OTHER): Payer: Medicare HMO | Admitting: Family Medicine

## 2018-12-02 ENCOUNTER — Encounter: Payer: Self-pay | Admitting: Family Medicine

## 2018-12-02 DIAGNOSIS — G8929 Other chronic pain: Secondary | ICD-10-CM | POA: Diagnosis not present

## 2018-12-02 DIAGNOSIS — M545 Low back pain: Secondary | ICD-10-CM | POA: Diagnosis not present

## 2018-12-02 MED ORDER — DICLOFENAC SODIUM 75 MG PO TBEC: 75.0000 mg | DELAYED_RELEASE_TABLET | Freq: Two times a day (BID) | ORAL | 2 refills | Status: DC

## 2018-12-02 NOTE — Progress Notes (Signed)
Subjective:    Patient ID: Anthony Benton., male    DOB: 01/11/1951, 68 y.o.   MRN: [id number]  HPI Virtual Visit via Video Note  I connected with the patient on 12/02/18 at  3:00 PM EDT by a video enabled telemedicine application and verified that I am speaking with the correct person using two identifiers.  Location patient: home Location provider:work or home office Persons participating in the virtual visit: patient, provider  I discussed the limitations of evaluation and management by telemedicine and the availability of in person appointments. The patient expressed understanding and agreed to proceed.   HPI: Here to discuss low back pain. He normally takes Tylenol but lately this has not been helping. No recent trauma.    ROS: See pertinent positives and negatives per HPI.  Past Medical History:  Diagnosis Date  . Arthritis    neck, knees; seen Dr. Rhona Raider, Norco 3-4 per day, 14 yrs, stable  . Coronary artery disease   . Hyperlipidemia   . Hypertension   . Insomnia   . OCD (obsessive compulsive disorder)    with anxiety  . Stuttering    uses Diazepam  . Tobacco abuse 1999, quit    Past Surgical History:  Procedure Laterality Date  . [place]  . CARDIAC CATHETERIZATION  03/19/11   stent X 2, in [place]  . CORONARY ANGIOPLASTY  10/12  . VASECTOMY  1983  . WISDOM TOOTH EXTRACTION      Family History  Problem Relation Age of Onset  . Arthritis Father   . Hypertension Father   . Prostate cancer Father   . Heart attack Father   . Arthritis Sister   . Breast cancer Sister      Current Outpatient Medications:  .  Acetaminophen (TYLENOL EXTRA STRENGTH PO), Take 500 mg by mouth daily as needed (pain). , Disp: , Rfl:  .  aspirin 81 MG tablet, Take 81 mg by mouth daily., Disp: , Rfl:  .  atorvastatin (LIPITOR) 80 MG tablet, Take 1 tablet (80 mg total) by mouth daily. Pt must call and make appt in July for further refills - Thanks, Disp: 90  tablet, Rfl: 0 .  diazepam (VALIUM) 5 MG tablet, TAKE 1 TABLET BY MOUTH THREE TIMES DAILY AS NEEDED FOR ANXIETY/SPEECH, Disp: 90 tablet, Rfl: 5 .  lisinopril (ZESTRIL) 10 MG tablet, Take 1 tablet (10 mg total) by mouth daily. For Hypertension:  Pt needs to make appt by July for further refills. Thanks, Disp: 90 tablet, Rfl: 0 .  metoprolol succinate (TOPROL-XL) 50 MG 24 hr tablet, TAKE 1 TABLET BY MOUTH EVERY DAY, Disp: 90 tablet, Rfl: 2 .  Multiple Vitamin (MULTIVITAMIN) tablet, Take 1 tablet by mouth daily., Disp: , Rfl:  .  Omega-3 Fatty Acids (FISH OIL) 1000 MG CAPS, Take 1,000 mg by mouth daily., Disp: , Rfl:  .  benzonatate (TESSALON) 100 MG capsule, Take 1 capsule (100 mg total) by mouth every 8 (eight) hours. (Patient not taking: Reported on 12/02/2018), Disp: 21 capsule, Rfl: 0 .  diclofenac (VOLTAREN) 75 MG EC tablet, Take 1 tablet (75 mg total) by mouth 2 (two) times daily., Disp: 60 tablet, Rfl: 2  EXAM:  VITALS per patient if applicable:  GENERAL: alert, oriented, appears well and in no acute distress  HEENT: atraumatic, conjunttiva clear, no obvious abnormalities on inspection of external nose and ears  NECK: normal movements of the head and neck  LUNGS: on inspection no signs  of respiratory distress, breathing rate appears normal, no obvious gross SOB, gasping or wheezing  CV: no obvious cyanosis  MS: moves all visible extremities without noticeable abnormality  PSYCH/NEURO: pleasant and cooperative, no obvious depression or anxiety, speech and thought processing grossly intact  ASSESSMENT AND PLAN: Back pain, try Diclofenac as needed.  Alysia Penna, MD  Discussed the following assessment and plan:  No diagnosis found.     I discussed the assessment and treatment plan with the patient. The patient was provided an opportunity to ask questions and all were answered. The patient agreed with the plan and demonstrated an understanding of the instructions.   The patient  was advised to call back or seek an in-person evaluation if the symptoms worsen or if the condition fails to improve as anticipated.     Review of Systems     Objective:   Physical Exam        Assessment & Plan:

## 2018-12-30 ENCOUNTER — Encounter: Payer: Medicare HMO | Admitting: Family Medicine

## 2018-12-31 ENCOUNTER — Ambulatory Visit (INDEPENDENT_AMBULATORY_CARE_PROVIDER_SITE_OTHER): Payer: Medicare HMO | Admitting: Family Medicine

## 2018-12-31 ENCOUNTER — Encounter: Payer: Self-pay | Admitting: Family Medicine

## 2018-12-31 VITALS — BP 160/90 | HR 62 | Temp 97.7°F | Wt 175.0 lb

## 2018-12-31 DIAGNOSIS — Z0000 Encounter for general adult medical examination without abnormal findings: Secondary | ICD-10-CM | POA: Diagnosis not present

## 2018-12-31 DIAGNOSIS — R972 Elevated prostate specific antigen [PSA]: Secondary | ICD-10-CM

## 2018-12-31 DIAGNOSIS — E785 Hyperlipidemia, unspecified: Secondary | ICD-10-CM

## 2018-12-31 LAB — HEPATIC FUNCTION PANEL
ALT: 19 U/L (ref 0–53)
AST: 20 U/L (ref 0–37)
Albumin: 4.7 g/dL (ref 3.5–5.2)
Alkaline Phosphatase: 62 U/L (ref 39–117)
Bilirubin, Direct: 0.1 mg/dL (ref 0.0–0.3)
Total Bilirubin: 0.6 mg/dL (ref 0.2–1.2)
Total Protein: 7 g/dL (ref 6.0–8.3)

## 2018-12-31 LAB — LIPID PANEL
Cholesterol: 177 mg/dL (ref 0–200)
HDL: 45.2 mg/dL (ref >39.00)
NonHDL: 132.27
Total CHOL/HDL Ratio: 4
Triglycerides: 206 mg/dL — ABNORMAL HIGH (ref 0.0–149.0)
VLDL: 41.2 mg/dL — ABNORMAL HIGH (ref 0.0–40.0)

## 2018-12-31 LAB — POC URINALSYSI DIPSTICK (AUTOMATED)
Bilirubin, UA: NEGATIVE
Blood, UA: NEGATIVE
Glucose, UA: NEGATIVE
Ketones, UA: NEGATIVE
Leukocytes, UA: NEGATIVE
Nitrite, UA: NEGATIVE
Protein, UA: NEGATIVE
Spec Grav, UA: 1.015 (ref 1.010–1.025)
Urobilinogen, UA: 0.2 E.U./dL
pH, UA: 7 (ref 5.0–8.0)

## 2018-12-31 LAB — BASIC METABOLIC PANEL
BUN: 10 mg/dL (ref 6–23)
CO2: 29 mEq/L (ref 19–32)
Calcium: 10.1 mg/dL (ref 8.4–10.5)
Chloride: 105 mEq/L (ref 96–112)
Creatinine, Ser: 0.86 mg/dL (ref 0.40–1.50)
GFR: 88.21 mL/min (ref >60.00)
Glucose, Bld: 95 mg/dL (ref 70–99)
Potassium: 4.1 mEq/L (ref 3.5–5.1)
Sodium: 142 mEq/L (ref 135–145)

## 2018-12-31 LAB — TSH: TSH: 1.58 u[IU]/mL (ref 0.35–4.50)

## 2018-12-31 LAB — PSA: PSA: 11.85 ng/mL — ABNORMAL HIGH (ref 0.10–4.00)

## 2018-12-31 LAB — CBC WITH DIFFERENTIAL/PLATELET
Basophils Absolute: 0 10*3/uL (ref 0.0–0.1)
Basophils Relative: 0.4 % (ref 0.0–3.0)
Eosinophils Absolute: 0.1 10*3/uL (ref 0.0–0.7)
Eosinophils Relative: 1.7 % (ref 0.0–5.0)
HCT: 41.7 % (ref 39.0–52.0)
Hemoglobin: 14.1 g/dL (ref 13.0–17.0)
Lymphocytes Relative: 25.4 % (ref 12.0–46.0)
Lymphs Abs: 2.2 10*3/uL (ref 0.7–4.0)
MCHC: 33.7 g/dL (ref 30.0–36.0)
MCV: 93.4 fl (ref 78.0–100.0)
Monocytes Absolute: 0.6 10*3/uL (ref 0.1–1.0)
Monocytes Relative: 7 % (ref 3.0–12.0)
Neutro Abs: 5.7 10*3/uL (ref 1.4–7.7)
Neutrophils Relative %: 65.5 % (ref 43.0–77.0)
Platelets: 196 10*3/uL (ref 150.0–400.0)
RBC: 4.46 Mil/uL (ref 4.22–5.81)
RDW: 13.4 % (ref 11.5–15.5)
WBC: 8.7 10*3/uL (ref 4.0–10.5)

## 2018-12-31 LAB — LDL CHOLESTEROL, DIRECT: Direct LDL: 99 mg/dL

## 2018-12-31 MED ORDER — LISINOPRIL 10 MG PO TABS: 10.0000 mg | ORAL_TABLET | Freq: Every day | ORAL | 3 refills | Status: DC

## 2018-12-31 MED ORDER — HYDROCODONE-ACETAMINOPHEN 5-325 MG PO TABS: 1.0000 | ORAL_TABLET | ORAL | 0 refills | Status: DC | PRN

## 2018-12-31 MED ORDER — ATORVASTATIN CALCIUM 80 MG PO TABS: 80.0000 mg | ORAL_TABLET | Freq: Every day | ORAL | 3 refills | Status: DC

## 2018-12-31 MED ORDER — METOPROLOL SUCCINATE ER 50 MG PO TB24: 50.0000 mg | ORAL_TABLET | Freq: Every day | ORAL | 3 refills | Status: DC

## 2018-12-31 NOTE — Progress Notes (Addendum)
Subjective:    Patient ID: Anthony Benton., male    DOB: 03-May-1951, 68 y.o.   MRN: [id number]  HPI Here to follow up on issues. His main concern is chronic pain in the neck and lower back. Tylenol and Ibuprofen and Diclofenac do not help. Otherwise his BP is stable. His anxiety continues to be a daily problem.    Review of Systems  Constitutional: Negative.   HENT: Negative.   Eyes: Negative.   Respiratory: Negative.   Cardiovascular: Negative.   Gastrointestinal: Negative.   Genitourinary: Negative.   Musculoskeletal: Positive for back pain and neck pain.  Skin: Negative.   Neurological: Negative.   Psychiatric/Behavioral: The patient is nervous/anxious.        Objective:   Physical Exam Constitutional:      General: He is not in acute distress.    Appearance: He is well-developed. He is not diaphoretic.  HENT:     Head: Normocephalic and atraumatic.     Right Ear: External ear normal.     Left Ear: External ear normal.     Nose: Nose normal.     Mouth/Throat:     Pharynx: No oropharyngeal exudate.  Eyes:     General: No scleral icterus.       Right eye: No discharge.        Left eye: No discharge.     Conjunctiva/sclera: Conjunctivae normal.     Pupils: Pupils are equal, round, and reactive to light.  Neck:     Musculoskeletal: Neck supple.     Thyroid: No thyromegaly.     Vascular: No JVD.     Trachea: No tracheal deviation.  Cardiovascular:     Rate and Rhythm: Normal rate and regular rhythm.     Heart sounds: Normal heart sounds. No murmur. No friction rub. No gallop.   Pulmonary:     Effort: Pulmonary effort is normal. No respiratory distress.     Breath sounds: Normal breath sounds. No wheezing or rales.  Chest:     Chest wall: No tenderness.  Abdominal:     General: Bowel sounds are normal. There is no distension.     Palpations: Abdomen is soft. There is no mass.     Tenderness: There is no abdominal tenderness. There is no guarding or  rebound.  Genitourinary:    Penis: Normal. No tenderness.      Prostate: Normal.     Rectum: Normal. Guaiac result negative.  Musculoskeletal: Normal range of motion.        General: No tenderness.  Lymphadenopathy:     Cervical: No cervical adenopathy.  Skin:    General: Skin is warm and dry.     Coloration: Skin is not pale.     Findings: No erythema or rash.  Neurological:     Mental Status: He is alert and oriented to person, place, and time.     Cranial Nerves: No cranial nerve deficit.     Motor: No abnormal muscle tone.     Coordination: Coordination normal.     Deep Tendon Reflexes: Reflexes are normal and symmetric. Reflexes normal.  Psychiatric:        Behavior: Behavior normal.        Thought Content: Thought content normal.        Judgment: Judgment normal.           Assessment & Plan:  He seems to be doing well overall. We discussed diet and exercise. Get labs to  include a PSA for BPH and other labs for hyperlipidemia. He again declined a colonoscopy. He will follow up with Dr. Irish Lack on 03-09-19. Alysia Penna, MD

## 2019-01-01 NOTE — Addendum Note (Signed)
Addended by: Alysia Penna A on: 01/01/2019 10:35 AM   Modules accepted: Orders

## 2019-01-07 ENCOUNTER — Telehealth: Payer: Self-pay | Admitting: Family Medicine

## 2019-01-07 NOTE — Telephone Encounter (Signed)
Spoke with patient. He stated that he poke with someone here in our office and was given the phone number to [facility] to check on his referral and schedule an appointment. We he called [facility] he was told he did not have a referral with them. He was told by [facility] with [facility] that she could schedule an appointment with out it. The patient has scheduled an appointment   Nothing further needed.

## 2019-01-07 NOTE — Telephone Encounter (Signed)
No there is nothing he can do until then

## 2019-01-07 NOTE — Telephone Encounter (Signed)
Pt called and states that he would like a call back from the nurse. Pt states that he has no received a call back to schedule with urology. Please call back

## 2019-01-07 NOTE — Telephone Encounter (Signed)
Patients appointment is in September. He would like to know if there is anything he should do between now and then to help with his PSA.  Please advise.

## 2019-01-08 NOTE — Telephone Encounter (Signed)
Patient is aware. Nothing further needed.  

## 2019-02-09 DIAGNOSIS — R972 Elevated prostate specific antigen [PSA]: Secondary | ICD-10-CM | POA: Diagnosis not present

## 2019-02-09 DIAGNOSIS — R351 Nocturia: Secondary | ICD-10-CM | POA: Diagnosis not present

## 2019-02-09 DIAGNOSIS — N401 Enlarged prostate with lower urinary tract symptoms: Secondary | ICD-10-CM | POA: Diagnosis not present

## 2019-02-19 ENCOUNTER — Telehealth: Payer: Self-pay | Admitting: Family Medicine

## 2019-02-19 NOTE — Telephone Encounter (Signed)
**  Patient calling and states that Dr Sarajane Jews wrote him this prescription to try for pain in August. States that this medication worked very well and would like to get a refill on this. Requesting 2 tablets a day, if okay with Dr Sarajane Jews. Please advise.**   Medication Refill - Medication: HYDROcodone-acetaminophen (NORCO) 5-325 MG tablet    Has the patient contacted their pharmacy? Yes.   (Agent: If no, request that the patient contact the pharmacy for the refill.) (Agent: If yes, when and what did the pharmacy advise?)  Preferred Pharmacy (with phone number or street name): [id number] - [place], [place]: Please be advised that RX refills may take up to 3 business days. We ask that you follow-up with your pharmacy.

## 2019-02-20 MED ORDER — HYDROCODONE-ACETAMINOPHEN 5-325 MG PO TABS: 1.0000 | ORAL_TABLET | ORAL | 0 refills | Status: AC | PRN

## 2019-02-20 NOTE — Telephone Encounter (Signed)
Copied from [place] [id number]. Topic: General - Other >> Feb 19, 2019 10:27 AM Rutherford Nail, NT wrote: Reason for CRM: Patient calling to let Dr Sarajane Jews know that he is scheduled with the urologist. He went last week and will go back 03/23/2019 and 03/30/2019.

## 2019-02-20 NOTE — Telephone Encounter (Signed)
Okay for refill? Please advise 

## 2019-02-20 NOTE — Telephone Encounter (Signed)
Noted  

## 2019-02-20 NOTE — Telephone Encounter (Signed)
I sent in #30 Norco. If he wants to stay on this he will needs to come in for a PMV, urine screen, contract, etc

## 2019-03-08 NOTE — Progress Notes (Signed)
Cardiology Office Note   Date:  03/09/2019   ID:  Anthony Pine., DOB 1950-06-04, MRN [id number]  PCP:  Laurey Morale, MD    No chief complaint on file.  CAD  Wt Readings from Last 3 Encounters:  03/09/19 179 lb (81.2 kg)  12/31/18 175 lb (79.4 kg)  06/03/18 165 lb 6 oz (75 kg)       History of Present Illness: Anthony Tardo. is a 68 y.o. male  with history of CAD status post DES to the RCA times 06/2010 at outside hospital.  Also has HLD, hypertension.  He had 2 stents to the RCA, 3.5x 15 Xience and 3.5 x 8 Xience in 02/2011 at an outside hospital. He was flown by helicopter but he did not have a heart attack. His initial sx were associated with walking on his long [address], which he would do for exercise.  He has high cholesterol. It runs in the family. Mother's cholesterol is over 300.  His sister passed away in Dec 06, 2022 from bone cancer. His mother was in a nursing home in 2018.  He remains actively involved in his church and is now a born again [place].  In 2019, it was noted: he was " Separated from wife, under a lot of stress, says he's homeless. Not eating properly, very depressed. Several friends have died. Staying with different people from church. 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 "  He found a place in Feb 2020.  He feels the stress continues.   He was started on lisinopril, but feels that he is having side effects. He thinks he has bone pain.    He does not check his BP at home.  He has not been exercising.  He continues to have issues with his ex-wife.   Has an enlarged prostate followed by Dr. Jeffie Pollock.   He has some hand pain.  He is willing to check.    Denies : Chest pain. Dizziness. Leg edema. Nitroglycerin use. Orthopnea. Palpitations. Paroxysmal nocturnal dyspnea. Shortness of breath. Syncope.      Past Medical History:  Diagnosis  Date  . Arthritis    neck, knees; seen Dr. Rhona Raider, Norco 3-4 per day, 14 yrs, stable  . Coronary artery disease   . Hyperlipidemia   . Hypertension   . Insomnia   . OCD (obsessive compulsive disorder)    with anxiety  . Stuttering    uses Diazepam  . Tobacco abuse 1999, quit    Past Surgical History:  Procedure Laterality Date  . [place]  . CARDIAC CATHETERIZATION  03/19/11   stent X 2, in [place]  . CORONARY ANGIOPLASTY  10/12  . VASECTOMY  1983  . WISDOM TOOTH EXTRACTION       Current Outpatient Medications  Medication Sig Dispense Refill  . Acetaminophen (TYLENOL EXTRA STRENGTH PO) Take 500 mg by mouth daily as needed (pain).     Marland Kitchen aspirin 81 MG tablet Take 81 mg by mouth daily.    Marland Kitchen atorvastatin (LIPITOR) 80 MG tablet Take 1 tablet (80 mg total) by mouth daily. Pt must call and make appt in July for further refills - Thanks 90 tablet 3  . diazepam (VALIUM) 5 MG tablet TAKE 1 TABLET BY MOUTH THREE TIMES DAILY AS NEEDED FOR ANXIETY/SPEECH 90 tablet 5  . lisinopril (ZESTRIL) 10 MG tablet Take 1 tablet (10  mg total) by mouth daily. For Hypertension:  Pt needs to make appt by July for further refills. Thanks 90 tablet 3  . metoprolol succinate (TOPROL-XL) 50 MG 24 hr tablet Take 1 tablet (50 mg total) by mouth daily. Take with or immediately following a meal. 90 tablet 3  . Multiple Vitamin (MULTIVITAMIN) tablet Take 1 tablet by mouth daily.    . Omega-3 Fatty Acids (FISH OIL) 1000 MG CAPS Take 1,000 mg by mouth daily.     No current facility-administered medications for this visit.     Allergies:   Penicillins    Social History:  The patient  reports that he has quit smoking. He has never used smokeless tobacco. He reports that he does not drink alcohol.   Family History:  The patient's family history includes Arthritis in his father and sister; Breast cancer in his sister; Heart attack in his father; Hypertension in his father; Prostate cancer in his  father.    ROS:  Please see the history of present illness.   Otherwise, review of systems are positive for stress with pandemic.   All other systems are reviewed and negative.    PHYSICAL EXAM: VS:  BP (!) 168/94   Pulse 65   Ht 5\' 9"  (1.753 m)   Wt 179 lb (81.2 kg)   SpO2 97%   BMI 26.43 kg/m  , BMI Body mass index is 26.43 kg/m. GEN: Well nourished, well developed, in no acute distress  HEENT: normal  Neck: no JVD, carotid bruits, or masses Cardiac: RRR; no murmurs, rubs, or gallops,no edema  Respiratory:  clear to auscultation bilaterally, normal work of breathing GI: soft, nontender, nondistended, + BS MS: no deformity or atrophy  Skin: warm and dry, no rash Neuro:  Strength and sensation are intact; stutter Psych: euthymic mood, full affect   EKG:   The ekg ordered today demonstrates NSR, RBBB, no ST changes   Recent Labs: 12/31/2018: ALT 19; BUN 10; Creatinine, Ser 0.86; Hemoglobin 14.1; Platelets 196.0; Potassium 4.1; Sodium 142; TSH 1.58   Lipid Panel    Component Value Date/Time   CHOL 177 12/31/2018 1039   TRIG 206.0 (H) 12/31/2018 1039   HDL 45.20 12/31/2018 1039   CHOLHDL 4 12/31/2018 1039   VLDL 41.2 (H) 12/31/2018 1039   LDLCALC 99 05/27/2017 1247   LDLDIRECT 99.0 12/31/2018 1039     Other studies Reviewed: Additional studies/ records that were reviewed today with results demonstrating: labs reviewed.   ASSESSMENT AND PLAN:  1. CAD: No angina.  COntinue aggressive secondary prevention.  O to take metoprolol in the morning.  Avoid tobacco 2. HTN: Elevated readings today.  Recheck 158/72.  I think he will need BP meds.  He wants to stop lisinopril.  He will check readings at home and call in with the data.  We can switch to losartan, since he feels lisinopril is causing "bone pain," but I think his hand pain is more arthritis.  3. Hyperlipidemia: LDL 99. Continue statin. Healthy diet.   4. Anxiety: Better as stress has improved.    Current  medicines are reviewed at length with the patient today.  The patient concerns regarding his medicines were addressed.  The following changes have been made:  No change  Labs/ tests ordered today include:  No orders of the defined types were placed in this encounter.   Recommend 150 minutes/week of aerobic exercise Low fat, low carb, high fiber diet recommended  Disposition:   FU in 1  year; phone call with BP readings in 2 weeks.   Signed, Larae Grooms, MD  03/09/2019 10:41 AM    [facility] [place], [place], [place]  [address] Phone: [phone]; Fax: [phone]

## 2019-03-09 ENCOUNTER — Ambulatory Visit (INDEPENDENT_AMBULATORY_CARE_PROVIDER_SITE_OTHER): Payer: Medicare HMO | Admitting: Interventional Cardiology

## 2019-03-09 ENCOUNTER — Encounter (INDEPENDENT_AMBULATORY_CARE_PROVIDER_SITE_OTHER): Payer: Self-pay

## 2019-03-09 ENCOUNTER — Other Ambulatory Visit: Payer: Self-pay

## 2019-03-09 ENCOUNTER — Encounter: Payer: Self-pay | Admitting: Interventional Cardiology

## 2019-03-09 VITALS — BP 168/94 | HR 65 | Ht 69.0 in | Wt 179.0 lb

## 2019-03-09 DIAGNOSIS — I10 Essential (primary) hypertension: Secondary | ICD-10-CM | POA: Diagnosis not present

## 2019-03-09 DIAGNOSIS — E782 Mixed hyperlipidemia: Secondary | ICD-10-CM | POA: Diagnosis not present

## 2019-03-09 DIAGNOSIS — F411 Generalized anxiety disorder: Secondary | ICD-10-CM

## 2019-03-09 DIAGNOSIS — I2510 Atherosclerotic heart disease of native coronary artery without angina pectoris: Secondary | ICD-10-CM

## 2019-03-09 DIAGNOSIS — R69 Illness, unspecified: Secondary | ICD-10-CM | POA: Diagnosis not present

## 2019-03-09 NOTE — Patient Instructions (Signed)
Medication Instructions:  Your physician recommends that you continue on your current medications as directed. Please refer to the Current Medication list given to you today.  *If you need a refill on your cardiac medications before your next appointment, please call your pharmacy*  Lab Work: None ordered If you have labs (blood work) drawn today and your tests are completely normal, you will receive your results only by: Marland Kitchen MyChart Message (if you have MyChart) OR . A paper copy in the mail If you have any lab test that is abnormal or we need to change your treatment, we will call you to review the results.  Testing/Procedures: None ordered  Follow-Up: At [facility], you and your health needs are our priority.  As part of our continuing mission to provide you with exceptional heart care, we have created designated Provider Care Teams.  These Care Teams include your primary Cardiologist (physician) and Advanced Practice Providers (APPs -  Physician Assistants and Nurse Practitioners) who all work together to provide you with the care you need, when you need it.  Your next appointment:   12 months  The format for your next appointment:   In Person  Provider:   You may see Larae Grooms, MD or one of the following Advanced Practice Providers on your designated Care Team:    Melina Copa, PA-C  Ermalinda Barrios, PA-C   Other Instructions Your physician has requested that you regularly monitor and record your blood pressure readings at home. Please use the same machine at the same time of day to check your readings and record them and call us in 1-2 weeks with your readings.

## 2019-03-16 ENCOUNTER — Other Ambulatory Visit: Payer: Self-pay | Admitting: Family Medicine

## 2019-03-19 NOTE — Telephone Encounter (Signed)
Okay for refill?  

## 2019-03-30 ENCOUNTER — Telehealth: Payer: Self-pay | Admitting: Family Medicine

## 2019-03-30 NOTE — Telephone Encounter (Signed)
Pt has been scheduled for VV 

## 2019-03-30 NOTE — Telephone Encounter (Signed)
Medication Refill - Medication:  HYDROcodone-acetaminophen (NORCO) 5-325 MG tablet  Has the patient contacted their pharmacy?  Yes advised to call office.   Preferred Pharmacy (with phone number or street name):  [id number] [place], [place] [phone] (Phone) [phone] (Fax)   Agent: Please be advised that RX refills may take up to 3 business days. We ask that you follow-up with your pharmacy.

## 2019-03-30 NOTE — Telephone Encounter (Signed)
Patient need to schedule an ov for more refills. Pt has been scheduled.

## 2019-03-30 NOTE — Telephone Encounter (Signed)
HYDROcodone-acetaminophen (NORCO) 5-325 MG tablet not on current medication list

## 2019-03-31 ENCOUNTER — Other Ambulatory Visit: Payer: Self-pay

## 2019-03-31 ENCOUNTER — Encounter: Payer: Self-pay | Admitting: Family Medicine

## 2019-03-31 ENCOUNTER — Telehealth (INDEPENDENT_AMBULATORY_CARE_PROVIDER_SITE_OTHER): Payer: Medicare HMO | Admitting: Family Medicine

## 2019-03-31 DIAGNOSIS — G8929 Other chronic pain: Secondary | ICD-10-CM | POA: Diagnosis not present

## 2019-03-31 DIAGNOSIS — M545 Low back pain: Secondary | ICD-10-CM

## 2019-03-31 DIAGNOSIS — M7912 Myalgia of auxiliary muscles, head and neck: Secondary | ICD-10-CM | POA: Diagnosis not present

## 2019-03-31 MED ORDER — HYDROCODONE-ACETAMINOPHEN 5-325 MG PO TABS: 1.0000 | ORAL_TABLET | ORAL | 0 refills | Status: AC | PRN

## 2019-03-31 NOTE — Progress Notes (Signed)
Virtual Visit via Video Note  I connected with the patient on 03/31/19 at 10:45 AM EST by a video enabled telemedicine application and verified that I am speaking with the correct person using two identifiers.  Location patient: home Location provider:work or home office Persons participating in the virtual visit: patient, provider  I discussed the limitations of evaluation and management by telemedicine and the availability of in person appointments. The patient expressed understanding and agreed to proceed.   HPI: Here to follow up on neck and low back pain. He has been taking one Norco a day to get by. The pain is getting worse and he intends to talk to Dr. Latanya Maudlin, his orthopedist soon.   ROS: See pertinent positives and negatives per HPI.  Past Medical History:  Diagnosis Date  . Arthritis    neck, knees; seen Dr. Rhona Raider, Norco 3-4 per day, 14 yrs, stable  . Coronary artery disease   . Hyperlipidemia   . Hypertension   . Insomnia   . OCD (obsessive compulsive disorder)    with anxiety  . Stuttering    uses Diazepam  . Tobacco abuse 1999, quit    Past Surgical History:  Procedure Laterality Date  . [place]  . CARDIAC CATHETERIZATION  03/19/11   stent X 2, in [place]  . CORONARY ANGIOPLASTY  10/12  . VASECTOMY  1983  . WISDOM TOOTH EXTRACTION      Family History  Problem Relation Age of Onset  . Arthritis Father   . Hypertension Father   . Prostate cancer Father   . Heart attack Father   . Arthritis Sister   . Breast cancer Sister      Current Outpatient Medications:  .  Acetaminophen (TYLENOL EXTRA STRENGTH PO), Take 500 mg by mouth daily as needed (pain). , Disp: , Rfl:  .  aspirin 81 MG tablet, Take 81 mg by mouth daily., Disp: , Rfl:  .  atorvastatin (LIPITOR) 80 MG tablet, Take 1 tablet (80 mg total) by mouth daily. Pt must call and make appt in July for further refills - Thanks, Disp: 90 tablet, Rfl: 3 .  diazepam (VALIUM) 5 MG  tablet, TAKE 1 TABLET BY MOUTH THREE TIMES DAILY AS NEEDED FOR ANXIETY/SPEECH, Disp: 90 tablet, Rfl: 5 .  HYDROcodone-acetaminophen (NORCO) 5-325 MG tablet, Take 1 tablet by mouth every 4 (four) hours as needed for up to 5 days for moderate pain., Disp: 30 tablet, Rfl: 0 .  lisinopril (ZESTRIL) 10 MG tablet, Take 1 tablet (10 mg total) by mouth daily. For Hypertension:  Pt needs to make appt by July for further refills. Thanks, Disp: 90 tablet, Rfl: 3 .  metoprolol succinate (TOPROL-XL) 50 MG 24 hr tablet, Take 1 tablet (50 mg total) by mouth daily. Take with or immediately following a meal., Disp: 90 tablet, Rfl: 3 .  Multiple Vitamin (MULTIVITAMIN) tablet, Take 1 tablet by mouth daily., Disp: , Rfl:  .  Omega-3 Fatty Acids (FISH OIL) 1000 MG CAPS, Take 1,000 mg by mouth daily., Disp: , Rfl:   EXAM:  VITALS per patient if applicable:  GENERAL: alert, oriented, appears well and in no acute distress  HEENT: atraumatic, conjunttiva clear, no obvious abnormalities on inspection of external nose and ears  NECK: normal movements of the head and neck  LUNGS: on inspection no signs of respiratory distress, breathing rate appears normal, no obvious gross SOB, gasping or wheezing  CV: no obvious cyanosis  MS: moves all visible extremities  without noticeable abnormality  PSYCH/NEURO: pleasant and cooperative, no obvious depression or anxiety, speech and thought processing grossly intact  ASSESSMENT AND PLAN: Neck and low back pain. We will refill the Norco for #30. He will see Orthopedics soon.  Alysia Penna, MD  Discussed the following assessment and plan:  Chronic bilateral low back pain without sciatica     I discussed the assessment and treatment plan with the patient. The patient was provided an opportunity to ask questions and all were answered. The patient agreed with the plan and demonstrated an understanding of the instructions.   The patient was advised to call back or seek an  in-person evaluation if the symptoms worsen or if the condition fails to improve as anticipated.

## 2019-04-08 DIAGNOSIS — M545 Low back pain: Secondary | ICD-10-CM | POA: Diagnosis not present

## 2019-04-08 DIAGNOSIS — M542 Cervicalgia: Secondary | ICD-10-CM | POA: Diagnosis not present

## 2019-04-27 NOTE — Telephone Encounter (Signed)
Sorry pt needed a refill for oxycodone.

## 2019-04-27 NOTE — Telephone Encounter (Signed)
Patient requests a call from the doctor's nurse regarding his pain medication.  He stated that he has some questions.  CB# [phone]

## 2019-04-27 NOTE — Telephone Encounter (Signed)
Spoke to pt and he stated he just needed a pain medication.

## 2019-04-28 ENCOUNTER — Other Ambulatory Visit: Payer: Self-pay

## 2019-04-28 NOTE — Telephone Encounter (Signed)
Spoke with patient. Informed patient per Dr. Sarajane Jews he needs to start a pain management program so he will need an in person appointment for UDS and sign a contract. Patient verbalizes he is "germaphobe" and Dr. Sarajane Jews knows this and does understand why he cant do this on the phone. Informed patient why this has to be done in the office and until  He makes and comes to the appointment he cant get medication. Patient verbalized understanding and reports he will call back.

## 2019-04-28 NOTE — Telephone Encounter (Signed)
He needs to start a pain management program. Set up a PMV to get a UDS, sign a contract, etc

## 2019-04-30 ENCOUNTER — Ambulatory Visit: Payer: Medicare HMO | Admitting: Family Medicine

## 2019-05-27 ENCOUNTER — Other Ambulatory Visit: Payer: Medicare HMO

## 2019-05-27 ENCOUNTER — Telehealth (INDEPENDENT_AMBULATORY_CARE_PROVIDER_SITE_OTHER): Payer: Medicare HMO | Admitting: Family Medicine

## 2019-05-27 ENCOUNTER — Other Ambulatory Visit: Payer: Self-pay

## 2019-05-27 DIAGNOSIS — M545 Low back pain: Secondary | ICD-10-CM | POA: Diagnosis not present

## 2019-05-27 DIAGNOSIS — G8929 Other chronic pain: Secondary | ICD-10-CM | POA: Diagnosis not present

## 2019-05-27 DIAGNOSIS — R69 Illness, unspecified: Secondary | ICD-10-CM | POA: Diagnosis not present

## 2019-05-27 DIAGNOSIS — F1190 Opioid use, unspecified, uncomplicated: Secondary | ICD-10-CM | POA: Diagnosis not present

## 2019-05-27 MED ORDER — HYDROCODONE-ACETAMINOPHEN 5-325 MG PO TABS: 1.0000 | ORAL_TABLET | Freq: Three times a day (TID) | ORAL | 0 refills | Status: AC | PRN

## 2019-05-27 MED ORDER — HYDROCODONE-ACETAMINOPHEN 5-325 MG PO TABS: 1.0000 | ORAL_TABLET | Freq: Three times a day (TID) | ORAL | 0 refills | Status: DC | PRN

## 2019-05-27 NOTE — Progress Notes (Signed)
Virtual Visit via Video Note  I connected with the patient on 05/27/19 at 11:00 AM EST by a video enabled telemedicine application and verified that I am speaking with the correct person using two identifiers.  Location patient: home Location provider:work or home office Persons participating in the virtual visit: patient, provider  I discussed the limitations of evaluation and management by telemedicine and the availability of in person appointments. The patient expressed understanding and agreed to proceed.   HPI: Here for his first pain management visit. He has chronic neck and low back pain from degenerative disc disease. He uses Ibuprofen and Tylenol with poor results. He gets good relief with Norco, but he has only been given short term supplies of this in the past. He saw Dr. Novella Olive on 04-08-19, and said there was not much he could do to help him. Anthony Benton now wants to enroll in a program to use pain medications on a daily basis.  Indication for chronic opioid: low back pain Medication and dose: Norco 5-325 # pills per month: 90 Last UDS date: 05-27-19 Opioid Treatment Agreement signed (Y/N): 05-27-19 Opioid Treatment Agreement last reviewed with patient:  05-27-19 [facility] reviewed this encounter (include red flags): Yes    ROS: See pertinent positives and negatives per HPI.  Past Medical History:  Diagnosis Date  . Arthritis    neck, knees; seen Dr. Rhona Raider, Norco 3-4 per day, 14 yrs, stable  . Coronary artery disease   . Hyperlipidemia   . Hypertension   . Insomnia   . OCD (obsessive compulsive disorder)    with anxiety  . Stuttering    uses Diazepam  . Tobacco abuse 1999, quit    Past Surgical History:  Procedure Laterality Date  . [place]  . CARDIAC CATHETERIZATION  03/19/11   stent X 2, in [place]  . CORONARY ANGIOPLASTY  10/12  . VASECTOMY  1983  . WISDOM TOOTH EXTRACTION      Family History  Problem Relation Age of Onset  . Arthritis Father    . Hypertension Father   . Prostate cancer Father   . Heart attack Father   . Arthritis Sister   . Breast cancer Sister      Current Outpatient Medications:  .  Acetaminophen (TYLENOL EXTRA STRENGTH PO), Take 500 mg by mouth daily as needed (pain). , Disp: , Rfl:  .  aspirin 81 MG tablet, Take 81 mg by mouth daily., Disp: , Rfl:  .  atorvastatin (LIPITOR) 80 MG tablet, Take 1 tablet (80 mg total) by mouth daily. Pt must call and make appt in July for further refills - Thanks, Disp: 90 tablet, Rfl: 3 .  diazepam (VALIUM) 5 MG tablet, TAKE 1 TABLET BY MOUTH THREE TIMES DAILY AS NEEDED FOR ANXIETY/SPEECH, Disp: 90 tablet, Rfl: 5 .  [START ON 07/25/2019] HYDROcodone-acetaminophen (NORCO) 5-325 MG tablet, Take 1 tablet by mouth every 8 (eight) hours as needed for moderate pain., Disp: 90 tablet, Rfl: 0 .  lisinopril (ZESTRIL) 10 MG tablet, Take 1 tablet (10 mg total) by mouth daily. For Hypertension:  Pt needs to make appt by July for further refills. Thanks, Disp: 90 tablet, Rfl: 3 .  metoprolol succinate (TOPROL-XL) 50 MG 24 hr tablet, Take 1 tablet (50 mg total) by mouth daily. Take with or immediately following a meal., Disp: 90 tablet, Rfl: 3 .  Multiple Vitamin (MULTIVITAMIN) tablet, Take 1 tablet by mouth daily., Disp: , Rfl:  .  Omega-3 Fatty Acids (FISH  OIL) 1000 MG CAPS, Take 1,000 mg by mouth daily., Disp: , Rfl:   EXAM:  VITALS per patient if applicable:  GENERAL: alert, oriented, appears well and in no acute distress  HEENT: atraumatic, conjunttiva clear, no obvious abnormalities on inspection of external nose and ears  NECK: normal movements of the head and neck  LUNGS: on inspection no signs of respiratory distress, breathing rate appears normal, no obvious gross SOB, gasping or wheezing  CV: no obvious cyanosis  MS: moves all visible extremities without noticeable abnormality  PSYCH/NEURO: pleasant and cooperative, no obvious depression or anxiety, speech and thought  processing grossly intact  ASSESSMENT AND PLAN: Pain management, we will prescribe 3 months of medication. He will come by the clinic today to sign a contract and to leave a sample for a UDS.  Alysia Penna, MD  Discussed the following assessment and plan:  Chronic narcotic use - Plan: Pain Mgmt, Profile 8 w/Conf, U  Chronic bilateral low back pain without sciatica     I discussed the assessment and treatment plan with the patient. The patient was provided an opportunity to ask questions and all were answered. The patient agreed with the plan and demonstrated an understanding of the instructions.   The patient was advised to call back or seek an in-person evaluation if the symptoms worsen or if the condition fails to improve as anticipated.

## 2019-05-29 LAB — PAIN MGMT, PROFILE 8 W/CONF, U
6 Acetylmorphine: NEGATIVE ng/mL
Alcohol Metabolites: NEGATIVE ng/mL (ref <500)
Alphahydroxyalprazolam: NEGATIVE ng/mL
Alphahydroxymidazolam: NEGATIVE ng/mL
Alphahydroxytriazolam: NEGATIVE ng/mL
Aminoclonazepam: NEGATIVE ng/mL
Amphetamines: NEGATIVE ng/mL
Benzodiazepines: POSITIVE ng/mL
Buprenorphine, Urine: NEGATIVE ng/mL
Cocaine Metabolite: NEGATIVE ng/mL
Creatinine: 117.4 mg/dL
Hydroxyethylflurazepam: NEGATIVE ng/mL
Lorazepam: NEGATIVE ng/mL
MDMA: NEGATIVE ng/mL
Marijuana Metabolite: NEGATIVE ng/mL
Nordiazepam: 923 ng/mL
Opiates: NEGATIVE ng/mL
Oxazepam: 1521 ng/mL
Oxidant: NEGATIVE ug/mL
Oxycodone: NEGATIVE ng/mL
Temazepam: 615 ng/mL
pH: 6.7 (ref 4.5–9.0)

## 2019-07-22 DIAGNOSIS — R3915 Urgency of urination: Secondary | ICD-10-CM | POA: Diagnosis not present

## 2019-07-22 DIAGNOSIS — R351 Nocturia: Secondary | ICD-10-CM | POA: Diagnosis not present

## 2019-07-22 DIAGNOSIS — R972 Elevated prostate specific antigen [PSA]: Secondary | ICD-10-CM | POA: Diagnosis not present

## 2019-07-22 DIAGNOSIS — N401 Enlarged prostate with lower urinary tract symptoms: Secondary | ICD-10-CM | POA: Diagnosis not present

## 2019-07-23 LAB — PSA: PSA: 10.7

## 2019-08-06 ENCOUNTER — Telehealth: Payer: Self-pay | Admitting: Interventional Cardiology

## 2019-08-06 NOTE — Telephone Encounter (Signed)
  Pt c/o medication issue:  1. Name of Medication: doxazosin 4 mg  2. How are you currently taking this medication (dosage and times per day)?   3. Are you having a reaction (difficulty breathing--STAT)?   4. What is your medication issue? Pt would like to speak with Dr. Hassell Done nurse regarding new meds prescribed by Dr. Jeffie Pollock for doxazosin 4mg , he wanted to make sure if this is safe to take with her other heart meds.  Please calll

## 2019-08-06 NOTE — Telephone Encounter (Signed)
Returned call to Pt.  Advised ok to take doxazosin.  Advised hypotension was most common side effect.  Spoke with Pt for 20 minutes.

## 2019-08-12 ENCOUNTER — Telehealth: Payer: Self-pay | Admitting: Family Medicine

## 2019-08-12 NOTE — Telephone Encounter (Signed)
Pt states that his prostate doctor prescribed him Flomax but the capsules are way to big for him to swallow. He spoke to them and they switch him to the doxazosin-mesylate 4mg . He asked his doctor's nurse what the number one side effect is and she said dizzy and can last 2-6 hrs. He states that he has a history of vertigo so it is easy for him to get dizzy. He does not want to take it if it is going to cause him to be dizzy. He is wondering if it will be okay for him to come to our office and have the nurse watch him take his medication and go back out to the car for an hour or so and wait to see what happens when he takes it? He is scared to take his medication by himself.   Pt can be reached at [phone]

## 2019-08-13 NOTE — Telephone Encounter (Signed)
Tell him not to worry about this medication. His vertigo is an inner ear problem that is completely different form the lightheadedness that some people get from Doxazocin. This can cause the BP to drop a little, but this has nothing to do with vertigo

## 2019-08-13 NOTE — Telephone Encounter (Signed)
Patient notified of update  and verbalized understanding. Pt stated that if he has any issues after he takes it he will call us back.

## 2019-08-13 NOTE — Telephone Encounter (Signed)
Message Routed to PCP for review and approval. 

## 2019-08-14 ENCOUNTER — Telehealth: Payer: Self-pay | Admitting: Interventional Cardiology

## 2019-08-14 NOTE — Telephone Encounter (Signed)
Called and spoke to patient. He states that Dr. Jeffie Pollock prescribed him flomax for his enlarged prostate but it was a capsule and he had difficulty swallowing it. He states that Dr. Jeffie Pollock then prescribed him  Doxazosin 4 mg QD. He is wanting to know if it is okay for him to take both the lisinopril and doxazosin. He does not have a way to check his BP at home. His last BP in the office was 168/94. I told him it was okay to take both of these meds together as long as his BP tolerated it. He is going to monitor his BP and let us know if there are any issues.

## 2019-08-14 NOTE — Telephone Encounter (Signed)
Pt c/o medication issue:  1. Name of Medication:  lisinopril (ZESTRIL) 10 MG tablet doxazosin oral 4 mg (not on medication list)  2. How are you currently taking this medication (dosage and times per day)?   3. Are you having a reaction (difficulty breathing--STAT)? no  4. What is your medication issue? Patient called because the Urologist Dr. Jeffie Pollock  gave him the Doxzosin for his enlarged prostate. He spoke with the pharmacist who said this medication is mainly for high BP, but a secondary use is for enlarged prostate.  The patient wants to know if it is OK for him to take both pills or if he needs to eliminate the lisionpril   Please advise

## 2019-08-31 ENCOUNTER — Other Ambulatory Visit: Payer: Self-pay

## 2019-09-01 ENCOUNTER — Encounter: Payer: Self-pay | Admitting: Family Medicine

## 2019-09-01 ENCOUNTER — Ambulatory Visit (INDEPENDENT_AMBULATORY_CARE_PROVIDER_SITE_OTHER): Payer: Medicare HMO | Admitting: Family Medicine

## 2019-09-01 VITALS — BP 150/80 | HR 84 | Temp 98.1°F | Wt 179.8 lb

## 2019-09-01 DIAGNOSIS — R69 Illness, unspecified: Secondary | ICD-10-CM | POA: Diagnosis not present

## 2019-09-01 DIAGNOSIS — N401 Enlarged prostate with lower urinary tract symptoms: Secondary | ICD-10-CM

## 2019-09-01 DIAGNOSIS — F515 Nightmare disorder: Secondary | ICD-10-CM

## 2019-09-01 DIAGNOSIS — N138 Other obstructive and reflux uropathy: Secondary | ICD-10-CM | POA: Diagnosis not present

## 2019-09-01 NOTE — Progress Notes (Signed)
   Subjective:    Patient ID: Anthony Benton., male    DOB: 1950-11-26, 69 y.o.   MRN: [id number]  HPI Here to ask about medication side effects. He has BPH, and he has to urinate numerous times during the day and the night. He sees Dr. Jeffie Pollock for urologic care. A few weeks ago he saw Dr. Jeffie Pollock, who prescribed Flomax. However these capsules are large, and Del could not swallow them. When he told Dr. Jeffie Pollock, he switched him to Doxazosin about a week ago. These have caused him to have "weird dreams" and nightmares however, and he asks what he should do. Incidentally he told Dr. Jeffie Pollock that he normally drinks 3-4 cups of coffee a day, and Dr. Jeffie Pollock told him to stop it. In fact he has completely stopped all coffee, and that alone has greatly decreased the frequency of his urinations.    Review of Systems  Constitutional: Negative.   Respiratory: Negative.   Cardiovascular: Negative.   Genitourinary: Positive for difficulty urinating and frequency.  Psychiatric/Behavioral: Positive for sleep disturbance.       Objective:   Physical Exam Constitutional:      Appearance: Normal appearance.  Cardiovascular:     Rate and Rhythm: Normal rate and regular rhythm.     Pulses: Normal pulses.     Heart sounds: Normal heart sounds.  Pulmonary:     Effort: Pulmonary effort is normal.     Breath sounds: Normal breath sounds.  Neurological:     Mental Status: He is alert.           Assessment & Plan:  BPH. I told him that sleep issues are common side effects of Doxazosin, so I advised him to stop taking this immediately. It seems that stopping coffee alone has helped the issue quite a bit. He will follow up with Dr. Jeffie Pollock as scheduled.  Alysia Penna, MD

## 2019-09-02 ENCOUNTER — Ambulatory Visit: Payer: Medicare HMO | Admitting: Family Medicine

## 2019-09-16 DIAGNOSIS — M722 Plantar fascial fibromatosis: Secondary | ICD-10-CM | POA: Diagnosis not present

## 2019-09-16 DIAGNOSIS — M79671 Pain in right foot: Secondary | ICD-10-CM | POA: Diagnosis not present

## 2019-09-16 DIAGNOSIS — M79672 Pain in left foot: Secondary | ICD-10-CM | POA: Diagnosis not present

## 2019-09-29 DIAGNOSIS — R69 Illness, unspecified: Secondary | ICD-10-CM | POA: Diagnosis not present

## 2019-10-20 ENCOUNTER — Other Ambulatory Visit: Payer: Self-pay | Admitting: Family Medicine

## 2019-10-20 NOTE — Telephone Encounter (Signed)
Okay for refill?   LOV 08/2019

## 2019-10-21 DIAGNOSIS — R351 Nocturia: Secondary | ICD-10-CM | POA: Diagnosis not present

## 2019-10-21 DIAGNOSIS — N401 Enlarged prostate with lower urinary tract symptoms: Secondary | ICD-10-CM | POA: Diagnosis not present

## 2019-10-21 DIAGNOSIS — R972 Elevated prostate specific antigen [PSA]: Secondary | ICD-10-CM | POA: Diagnosis not present

## 2019-10-26 ENCOUNTER — Other Ambulatory Visit: Payer: Self-pay | Admitting: Vascular Surgery

## 2019-10-26 DIAGNOSIS — R972 Elevated prostate specific antigen [PSA]: Secondary | ICD-10-CM

## 2019-11-25 ENCOUNTER — Telehealth: Payer: Self-pay | Admitting: Family Medicine

## 2019-11-25 NOTE — Telephone Encounter (Signed)
I understand. Let's push the AWV out a few weeks

## 2019-11-25 NOTE — Telephone Encounter (Signed)
Spoke with patient he stated he did the AWV in the past and he declined.

## 2019-11-25 NOTE — Telephone Encounter (Signed)
Spoke with patient to schedule AWV, he stated he wanted Dr Sarajane Jews to know that he has an MRI scheduled tomorrow at 1pm at [facility] from the referral

## 2019-11-26 ENCOUNTER — Other Ambulatory Visit: Payer: Self-pay

## 2019-11-26 ENCOUNTER — Ambulatory Visit
Admission: RE | Admit: 2019-11-26 | Discharge: 2019-11-26 | Disposition: A | Discharge status: Home / Self Care | Payer: Medicare HMO | Source: Ambulatory Visit | Attending: Vascular Surgery | Admitting: Vascular Surgery

## 2019-11-26 DIAGNOSIS — N400 Enlarged prostate without lower urinary tract symptoms: Secondary | ICD-10-CM | POA: Diagnosis not present

## 2019-11-26 DIAGNOSIS — R972 Elevated prostate specific antigen [PSA]: Secondary | ICD-10-CM

## 2019-11-26 MED ORDER — GADOBENATE DIMEGLUMINE 529 MG/ML IV SOLN
16.0000 mL | Freq: Once | INTRAVENOUS | Status: AC | PRN
  Administered 2019-11-26: 16 mL via INTRAVENOUS

## 2019-12-01 ENCOUNTER — Encounter: Payer: Self-pay | Admitting: Family Medicine

## 2020-01-20 DIAGNOSIS — R972 Elevated prostate specific antigen [PSA]: Secondary | ICD-10-CM | POA: Diagnosis not present

## 2020-01-23 ENCOUNTER — Other Ambulatory Visit: Payer: Self-pay | Admitting: Family Medicine

## 2020-01-27 DIAGNOSIS — R972 Elevated prostate specific antigen [PSA]: Secondary | ICD-10-CM | POA: Diagnosis not present

## 2020-01-27 DIAGNOSIS — R351 Nocturia: Secondary | ICD-10-CM | POA: Diagnosis not present

## 2020-01-27 DIAGNOSIS — N401 Enlarged prostate with lower urinary tract symptoms: Secondary | ICD-10-CM | POA: Diagnosis not present

## 2020-02-10 DIAGNOSIS — M542 Cervicalgia: Secondary | ICD-10-CM | POA: Diagnosis not present

## 2020-02-10 DIAGNOSIS — M25512 Pain in left shoulder: Secondary | ICD-10-CM | POA: Diagnosis not present

## 2020-02-10 DIAGNOSIS — M545 Low back pain: Secondary | ICD-10-CM | POA: Diagnosis not present

## 2020-03-01 ENCOUNTER — Ambulatory Visit: Payer: Medicare HMO | Admitting: Interventional Cardiology

## 2020-03-01 ENCOUNTER — Encounter: Payer: Self-pay | Admitting: Interventional Cardiology

## 2020-03-01 ENCOUNTER — Other Ambulatory Visit: Payer: Self-pay

## 2020-03-01 VITALS — BP 160/82 | HR 69 | Ht 69.0 in | Wt 183.4 lb

## 2020-03-01 DIAGNOSIS — E782 Mixed hyperlipidemia: Secondary | ICD-10-CM | POA: Diagnosis not present

## 2020-03-01 DIAGNOSIS — I10 Essential (primary) hypertension: Secondary | ICD-10-CM

## 2020-03-01 DIAGNOSIS — I2510 Atherosclerotic heart disease of native coronary artery without angina pectoris: Secondary | ICD-10-CM | POA: Diagnosis not present

## 2020-03-01 MED ORDER — LISINOPRIL 20 MG PO TABS: 20.0000 mg | ORAL_TABLET | Freq: Every day | ORAL | 3 refills | Status: DC

## 2020-03-01 MED ORDER — NITROGLYCERIN 0.4 MG SL SUBL
0.4000 mg | SUBLINGUAL_TABLET | SUBLINGUAL | 3 refills | Status: DC | PRN
Start: 2020-03-01 — End: 2021-08-29

## 2020-03-01 NOTE — Patient Instructions (Addendum)
Medication Instructions:  Your physician has recommended you make the following change in your medication:   1. INCREASE: lisinopril to 20 mg once a day   2. START: Sublingual Nitroglycerin 0.4 mg tablet: Use as directed AS NEEDED for chest pain  *If you need a refill on your cardiac medications before your next appointment, please call your pharmacy*   Lab Work: None  If you have labs (blood work) drawn today and your tests are completely normal, you will receive your results only by: Marland Kitchen MyChart Message (if you have MyChart) OR . A paper copy in the mail If you have any lab test that is abnormal or we need to change your treatment, we will call you to review the results.   Testing/Procedures: None   Follow-Up: At [facility], you and your health needs are our priority.  As part of our continuing mission to provide you with exceptional heart care, we have created designated Provider Care Teams.  These Care Teams include your primary Cardiologist (physician) and Advanced Practice Providers (APPs -  Physician Assistants and Nurse Practitioners) who all work together to provide you with the care you need, when you need it.  We recommend signing up for the patient portal called "MyChart".  Sign up information is provided on this After Visit Summary.  MyChart is used to connect with patients for Virtual Visits (Telemedicine).  Patients are able to view lab/test results, encounter notes, upcoming appointments, etc.  Non-urgent messages can be sent to your provider as well.   To learn more about what you can do with MyChart, go to [email or web address].    Your next appointment:   12 month(s)  The format for your next appointment:   In Person  Provider:   You may see Larae Grooms, MD or one of the following Advanced Practice Providers on your designated Care Team:    Melina Copa, PA-C  Ermalinda Barrios, PA-C    Other Instructions None

## 2020-03-01 NOTE — Progress Notes (Signed)
Cardiology Office Note   Date:  03/01/2020   ID:  Anthony Pine., DOB 27-Mar-1951, MRN [id number]  PCP:  Laurey Morale, MD    No chief complaint on file.  CAD  Wt Readings from Last 3 Encounters:  03/01/20 183 lb 6.4 oz (83.2 kg)  09/01/19 179 lb 12.8 oz (81.6 kg)  03/09/19 179 lb (81.2 kg)       History of Present Illness: Anthony Hagmann. is a 69 y.o. male   with history of CAD status post DES to the RCA times 06/2010 at outside hospital. Also has HLD, hypertension.He had 2 stents to the RCA, 3.5x 15 Xience and 3.5 x 8 Xience in 02/2011 at an outside hospital. He was flown by helicopter but he did not have a heart attack. His initial sx were associated with walking on his long Drive[place], which he would do for exercise.  He has high cholesterol. It runs in the family. Mother's cholesterol is over 300.  His sisterpassed away in 6/18 frombone cancer. His mother was in a nursing home in 2018.  He remains actively involved in his church and is now a born again [place].  In 2019, it was noted: he was " Separated from wife, under a lot of stress, says he's homeless. Not eating properly, very depressed. Several friends have died. Staying with different people from church. 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 "  He found a place in Feb 2020.  He feels the stress continues.   He was started on lisinopril, but feels that he is having side effects. He thinks he has bone pain.    He does not check his BP at home.  He has not been exercising.  He continues to have issues with his ex-wife.   Has an enlarged prostate followed by Dr. Jeffie Pollock.   Since the last visit, he has stayed away from crowds.  He has not been walking as much.  He has been stressed by COVID.    Denies : Chest pain. Dizziness. Leg edema. Nitroglycerin use. Orthopnea. Palpitations. Paroxysmal  nocturnal dyspnea. Shortness of breath. Syncope.   He is concerned that his metoprolol is causing him to have vivid dreams. Problems when taking this med from [employer].  He thinks that makes a difference.   He did not get the COVID vaccines, and is not interested. He does not take the flu shot either.     Past Medical History:  Diagnosis Date  . Arthritis    neck, knees; seen Dr. Rhona Raider, Norco 3-4 per day, 14 yrs, stable  . Coronary artery disease   . Hyperlipidemia   . Hypertension   . Insomnia   . OCD (obsessive compulsive disorder)    with anxiety  . Stuttering    uses Diazepam  . Tobacco abuse 1999, quit    Past Surgical History:  Procedure Laterality Date  . [place]  . CARDIAC CATHETERIZATION  03/19/11   stent X 2, in [place]  . CORONARY ANGIOPLASTY  10/12  . VASECTOMY  1983  . WISDOM TOOTH EXTRACTION       Current Outpatient Medications  Medication Sig Dispense Refill  . Acetaminophen (TYLENOL EXTRA STRENGTH PO) Take 500 mg by mouth daily as needed (pain).     Marland Kitchen aspirin 81 MG tablet Take 81 mg by mouth daily.    Marland Kitchen atorvastatin (  LIPITOR) 80 MG tablet TAKE 1 TABLET BY MOUTH DAILY. 90 tablet 0  . diazepam (VALIUM) 5 MG tablet TAKE 1 TABLET BY MOUTH THREE TIMES DAILY AS NEEDED FOR ANXIETY/SPEECH 90 tablet 5  . lisinopril (ZESTRIL) 10 MG tablet TAKE 1 TABLET (10 MG TOTAL) BY MOUTH DAILY FOR HYPERTENSION 90 tablet 0  . metoprolol succinate (TOPROL-XL) 50 MG 24 hr tablet TAKE 1 TABLET (50 MG TOTAL) BY MOUTH DAILY. TAKE WITH OR IMMEDIATELY FOLLOWING A MEAL. 90 tablet 0  . Multiple Vitamin (MULTIVITAMIN) tablet Take 1 tablet by mouth daily.    . Omega-3 Fatty Acids (FISH OIL) 1000 MG CAPS Take 1,000 mg by mouth daily.     No current facility-administered medications for this visit.    Allergies:   Penicillins and Doxazosin    Social History:  The patient  reports that he has quit smoking. He has never used smokeless tobacco. He reports that he  does not drink alcohol.   Family History:  The patient's family history includes Arthritis in his father and sister; Breast cancer in his sister; Heart attack in his father; Hypertension in his father; Prostate cancer in his father.    ROS:  Please see the history of present illness.   Otherwise, review of systems are positive for vivid dreams when he takes metoprolol from a different manufacturer.   All other systems are reviewed and negative.    PHYSICAL EXAM: VS:  BP (!) 160/82   Pulse 69   Ht 5\' 9"  (1.753 m)   Wt 183 lb 6.4 oz (83.2 kg)   SpO2 96%   BMI 27.08 kg/m  , BMI Body mass index is 27.08 kg/m. GEN: Well nourished, well developed, in no acute distress  HEENT: normal  Neck: no JVD, carotid bruits, or masses Cardiac: RRR; no murmurs, rubs, or gallops,no edema  Respiratory:  clear to auscultation bilaterally, normal work of breathing GI: soft, nontender, nondistended, + BS MS: no deformity or atrophy  Skin: warm and dry, no rash Neuro:  Strength and sensation are intact Psych: euthymic mood, full affect   EKG:   The ekg ordered today demonstrates NSR, RBBB, no ST changes   Recent Labs: No results found for requested labs within last 8760 hours.   Lipid Panel    Component Value Date/Time   CHOL 177 12/31/2018 1039   TRIG 206.0 (H) 12/31/2018 1039   HDL 45.20 12/31/2018 1039   CHOLHDL 4 12/31/2018 1039   VLDL 41.2 (H) 12/31/2018 1039   LDLCALC 99 05/27/2017 1247   LDLDIRECT 99.0 12/31/2018 1039     Other studies Reviewed: Additional studies/ records that were reviewed today with results demonstrating: labs reviewed.   ASSESSMENT AND PLAN:  1. CAD: OK to move metoprolol to daytime to see if vivid dreams are decreased. No angina. COntinue aggressive secondary prevention.  Rx for SL NTG given.  Use instructions given.  2. HTN: Elevated readings.  Low salt diet.  Increase lisinopril to 20 mg daily.  He has f/u with Dr. Sarajane Jews in a few weeks.  He will need BMet at  that time to check Cr, K. 3. Hyperlipidemia: Continue high dose statin.  Lipids to be checked soon with PMD, per patient.  4. Anxiety: using Valium.  5. OK to use pain meds for back as prescribed by Dr. Sarajane Jews.     Current medicines are reviewed at length with the patient today.  The patient concerns regarding his medicines were addressed.  The following changes have been  made:  No change  Labs/ tests ordered today include:  No orders of the defined types were placed in this encounter.   Recommend 150 minutes/week of aerobic exercise Low fat, low carb, high fiber diet recommended  Disposition:   FU in 1 year   Signed, Larae Grooms, MD  03/01/2020 1:29 PM    [facility] [address], [place], [place]  [address] Phone: [phone]; Fax: [phone]

## 2020-03-02 ENCOUNTER — Encounter: Payer: Self-pay | Admitting: Family Medicine

## 2020-03-02 ENCOUNTER — Other Ambulatory Visit: Payer: Self-pay

## 2020-03-02 ENCOUNTER — Ambulatory Visit (INDEPENDENT_AMBULATORY_CARE_PROVIDER_SITE_OTHER): Payer: Medicare HMO | Admitting: Family Medicine

## 2020-03-02 VITALS — BP 160/90 | HR 80 | Temp 98.1°F | Ht 69.0 in | Wt 180.0 lb

## 2020-03-02 DIAGNOSIS — E782 Mixed hyperlipidemia: Secondary | ICD-10-CM | POA: Diagnosis not present

## 2020-03-02 DIAGNOSIS — F411 Generalized anxiety disorder: Secondary | ICD-10-CM

## 2020-03-02 DIAGNOSIS — I10 Essential (primary) hypertension: Secondary | ICD-10-CM | POA: Diagnosis not present

## 2020-03-02 DIAGNOSIS — F1190 Opioid use, unspecified, uncomplicated: Secondary | ICD-10-CM

## 2020-03-02 DIAGNOSIS — E039 Hypothyroidism, unspecified: Secondary | ICD-10-CM

## 2020-03-02 DIAGNOSIS — K219 Gastro-esophageal reflux disease without esophagitis: Secondary | ICD-10-CM

## 2020-03-02 DIAGNOSIS — E538 Deficiency of other specified B group vitamins: Secondary | ICD-10-CM | POA: Diagnosis not present

## 2020-03-02 DIAGNOSIS — E559 Vitamin D deficiency, unspecified: Secondary | ICD-10-CM | POA: Diagnosis not present

## 2020-03-02 DIAGNOSIS — M545 Low back pain, unspecified: Secondary | ICD-10-CM

## 2020-03-02 DIAGNOSIS — G8929 Other chronic pain: Secondary | ICD-10-CM | POA: Diagnosis not present

## 2020-03-02 DIAGNOSIS — R69 Illness, unspecified: Secondary | ICD-10-CM | POA: Diagnosis not present

## 2020-03-02 MED ORDER — HYDROCODONE-ACETAMINOPHEN 5-325 MG PO TABS
1.0000 | ORAL_TABLET | Freq: Three times a day (TID) | ORAL | 0 refills | Status: DC | PRN
Start: 2020-03-02 — End: 2020-03-02

## 2020-03-02 MED ORDER — HYDROCODONE-ACETAMINOPHEN 5-325 MG PO TABS
1.0000 | ORAL_TABLET | Freq: Three times a day (TID) | ORAL | 0 refills | Status: DC | PRN
Start: 2020-04-02 — End: 2020-03-02

## 2020-03-02 MED ORDER — HYDROCODONE-ACETAMINOPHEN 5-325 MG PO TABS
1.0000 | ORAL_TABLET | Freq: Three times a day (TID) | ORAL | 0 refills | Status: AC | PRN
Start: 2020-05-02 — End: 2020-06-01

## 2020-03-02 NOTE — Progress Notes (Signed)
   Subjective:    Patient ID: Anthony Benton., male    DOB: 06/02/1950, 69 y.o.   MRN: [id number]  HPI Here for pain management. He is struggling with low back pain, left shoulder pain, and right knee pain. He recently had steroid injections to the shoulder and lower spine per Dr. Novella Olive.  Indication for chronic opioid: low back pain  Medication and dose: Norco 5-325 # pills per month: 90 Last UDS date: 05-27-19 Opioid Treatment Agreement signed (Y/N): 05-27-19 Opioid Treatment Agreement last reviewed with patient:  03-02-20 NCCSRS reviewed this encounter (include red flags): Yes    Review of Systems     Objective:   Physical Exam        Assessment & Plan:  Pain management, meds were refilled.  Alysia Penna, MD

## 2020-03-03 LAB — CBC WITH DIFFERENTIAL/PLATELET
Absolute Monocytes: 618 {cells}/uL (ref 200–950)
Basophils Absolute: 21 {cells}/uL (ref 0–200)
Basophils Relative: 0.3 %
Eosinophils Absolute: 99 {cells}/uL (ref 15–500)
Eosinophils Relative: 1.4 %
HCT: 41 % (ref 38.5–50.0)
Hemoglobin: 14 g/dL (ref 13.2–17.1)
Lymphs Abs: 1938 {cells}/uL (ref 850–3900)
MCH: 31.5 pg (ref 27.0–33.0)
MCHC: 34.1 g/dL (ref 32.0–36.0)
MCV: 92.3 fL (ref 80.0–100.0)
MPV: 9.7 fL (ref 7.5–12.5)
Monocytes Relative: 8.7 %
Neutro Abs: 4423 {cells}/uL (ref 1500–7800)
Neutrophils Relative %: 62.3 %
Platelets: 204 10*3/uL (ref 140–400)
RBC: 4.44 Million/uL (ref 4.20–5.80)
RDW: 12.7 % (ref 11.0–15.0)
Total Lymphocyte: 27.3 %
WBC: 7.1 10*3/uL (ref 3.8–10.8)

## 2020-03-03 LAB — MAGNESIUM: Magnesium: 2 mg/dL (ref 1.5–2.5)

## 2020-03-03 LAB — LIPID PANEL
Cholesterol: 219 mg/dL — ABNORMAL HIGH (ref <200)
HDL: 52 mg/dL (ref >=40)
LDL Cholesterol (Calc): 129 mg/dL (calc) — ABNORMAL HIGH
Non-HDL Cholesterol (Calc): 167 mg/dL (calc) — ABNORMAL HIGH (ref <130)
Total CHOL/HDL Ratio: 4.2 (calc) (ref <5.0)
Triglycerides: 238 mg/dL — ABNORMAL HIGH (ref <150)

## 2020-03-03 LAB — HEPATIC FUNCTION PANEL
AG Ratio: 1.6 (calc) (ref 1.0–2.5)
ALT: 21 U/L (ref 9–46)
AST: 18 U/L (ref 10–35)
Albumin: 4.2 g/dL (ref 3.6–5.1)
Alkaline phosphatase (APISO): 68 U/L (ref 35–144)
Bilirubin, Direct: 0.1 mg/dL (ref 0.0–0.2)
Globulin: 2.6 g/dL (calc) (ref 1.9–3.7)
Indirect Bilirubin: 0.5 mg/dL (calc) (ref 0.2–1.2)
Total Bilirubin: 0.6 mg/dL (ref 0.2–1.2)
Total Protein: 6.8 g/dL (ref 6.1–8.1)

## 2020-03-03 LAB — VITAMIN D 25 HYDROXY (VIT D DEFICIENCY, FRACTURES): Vit D, 25-Hydroxy: 22 ng/mL — ABNORMAL LOW (ref 30–100)

## 2020-03-03 LAB — BASIC METABOLIC PANEL
BUN: 10 mg/dL (ref 7–25)
CO2: 25 mmol/L (ref 20–32)
Calcium: 10.1 mg/dL (ref 8.6–10.3)
Chloride: 102 mmol/L (ref 98–110)
Creat: 0.83 mg/dL (ref 0.70–1.18)
Glucose, Bld: 88 mg/dL (ref 65–99)
Potassium: 3.9 mmol/L (ref 3.5–5.3)
Sodium: 137 mmol/L (ref 135–146)

## 2020-03-03 LAB — TSH: TSH: 1.43 mIU/L (ref 0.40–4.50)

## 2020-03-03 LAB — VITAMIN B12: Vitamin B-12: 284 pg/mL (ref 200–1100)

## 2020-03-04 ENCOUNTER — Ambulatory Visit: Payer: Medicare HMO | Admitting: Family Medicine

## 2020-03-30 DIAGNOSIS — C44519 Basal cell carcinoma of skin of other part of trunk: Secondary | ICD-10-CM | POA: Diagnosis not present

## 2020-03-30 DIAGNOSIS — D2261 Melanocytic nevi of right upper limb, including shoulder: Secondary | ICD-10-CM | POA: Diagnosis not present

## 2020-03-30 DIAGNOSIS — L570 Actinic keratosis: Secondary | ICD-10-CM | POA: Diagnosis not present

## 2020-03-30 DIAGNOSIS — L814 Other melanin hyperpigmentation: Secondary | ICD-10-CM | POA: Diagnosis not present

## 2020-03-30 DIAGNOSIS — L821 Other seborrheic keratosis: Secondary | ICD-10-CM | POA: Diagnosis not present

## 2020-03-30 DIAGNOSIS — Z85828 Personal history of other malignant neoplasm of skin: Secondary | ICD-10-CM | POA: Diagnosis not present

## 2020-03-30 DIAGNOSIS — D225 Melanocytic nevi of trunk: Secondary | ICD-10-CM | POA: Diagnosis not present

## 2020-04-20 ENCOUNTER — Other Ambulatory Visit: Payer: Self-pay | Admitting: Family Medicine

## 2020-05-16 ENCOUNTER — Other Ambulatory Visit: Payer: Self-pay | Admitting: Family Medicine

## 2020-05-19 NOTE — Progress Notes (Signed)
Key: [facility] - [facility] Case ID: [id number] Need help? Call us at [phone] Status Sent to [employer] diazePAM 5MG  tablets Form [employer] Electronic [place] Form [phone] NCPDP)

## 2020-07-21 ENCOUNTER — Other Ambulatory Visit: Payer: Self-pay | Admitting: Family Medicine

## 2020-08-04 ENCOUNTER — Other Ambulatory Visit: Payer: Self-pay

## 2020-08-05 ENCOUNTER — Encounter: Payer: Self-pay | Admitting: Family Medicine

## 2020-08-05 ENCOUNTER — Ambulatory Visit (INDEPENDENT_AMBULATORY_CARE_PROVIDER_SITE_OTHER): Payer: Medicare HMO | Admitting: Family Medicine

## 2020-08-05 VITALS — BP 156/80 | HR 66 | Temp 98.3°F | Wt 180.4 lb

## 2020-08-05 DIAGNOSIS — F411 Generalized anxiety disorder: Secondary | ICD-10-CM

## 2020-08-05 DIAGNOSIS — M8949 Other hypertrophic osteoarthropathy, multiple sites: Secondary | ICD-10-CM

## 2020-08-05 DIAGNOSIS — M1990 Unspecified osteoarthritis, unspecified site: Secondary | ICD-10-CM | POA: Insufficient documentation

## 2020-08-05 DIAGNOSIS — M67442 Ganglion, left hand: Secondary | ICD-10-CM | POA: Diagnosis not present

## 2020-08-05 DIAGNOSIS — I10 Essential (primary) hypertension: Secondary | ICD-10-CM | POA: Diagnosis not present

## 2020-08-05 DIAGNOSIS — R69 Illness, unspecified: Secondary | ICD-10-CM | POA: Diagnosis not present

## 2020-08-05 DIAGNOSIS — M159 Polyosteoarthritis, unspecified: Secondary | ICD-10-CM

## 2020-08-05 NOTE — Progress Notes (Signed)
   Subjective:    Patient ID: Anthony Benton., male    DOB: [birth year], 70 y.o.   MRN: [id number]  HPI Here for several issues. First he asks me to check a knot he discovered on his left index finger a few days ago. This does not bother him at all. Also he wants to check his BP. His anxiety has been very high lately, and he has gotten BP readings at high as 200/110 at hoome. No chest pain or headaches or SOB. He has taken a few Valium, but he is hesitant to take this medication. He has also had trouble sleeping at night, even when he takes a Valium. His OA has been very painful and this interferes with his sleep. We have provided him with Norco for pain, but he is hesitant to use thi as well.    Review of Systems  Constitutional: Negative.   Respiratory: Negative.   Cardiovascular: Negative.   Musculoskeletal: Positive for arthralgias.  Psychiatric/Behavioral: Positive for sleep disturbance. Negative for agitation, behavioral problems, confusion, decreased concentration and dysphoric mood. The patient is nervous/anxious.        Objective:   Physical Exam Constitutional:      Appearance: Normal appearance.  Cardiovascular:     Rate and Rhythm: Normal rate and regular rhythm.     Pulses: Normal pulses.     Heart sounds: Normal heart sounds.  Pulmonary:     Effort: Pulmonary effort is normal.     Breath sounds: Normal breath sounds.  Musculoskeletal:     Comments: There is a tiny firm mobile non-tender round mass on the flexor surface of the left index finger at the PIP. Full ROM  Neurological:     Mental Status: He is alert.  Psychiatric:        Behavior: Behavior normal.        Thought Content: Thought content normal.        Judgment: Judgment normal.     Comments: Very anxious            Assessment & Plan:  He has a small ganglion cyst on the finger. We agreed he will monitor this and follow up if it starts to bother him. I think his HTN is probably controlled except  for when his anxiety flares up. I advised him to use the Valium as prescribed to keep this controlled. For sleep, I advised him to take a Norco at bedtime to get him through the night. Alysia Penna, MD

## 2020-09-14 DIAGNOSIS — M7062 Trochanteric bursitis, left hip: Secondary | ICD-10-CM | POA: Diagnosis not present

## 2020-09-30 ENCOUNTER — Encounter: Payer: Self-pay | Admitting: Family Medicine

## 2020-09-30 ENCOUNTER — Ambulatory Visit (INDEPENDENT_AMBULATORY_CARE_PROVIDER_SITE_OTHER): Payer: Medicare HMO | Admitting: Family Medicine

## 2020-09-30 ENCOUNTER — Other Ambulatory Visit: Payer: Self-pay

## 2020-09-30 VITALS — BP 148/80 | HR 64 | Temp 98.2°F | Wt 178.4 lb

## 2020-09-30 DIAGNOSIS — I10 Essential (primary) hypertension: Secondary | ICD-10-CM

## 2020-09-30 DIAGNOSIS — R69 Illness, unspecified: Secondary | ICD-10-CM | POA: Diagnosis not present

## 2020-09-30 DIAGNOSIS — Z209 Contact with and (suspected) exposure to unspecified communicable disease: Secondary | ICD-10-CM | POA: Diagnosis not present

## 2020-09-30 DIAGNOSIS — E538 Deficiency of other specified B group vitamins: Secondary | ICD-10-CM | POA: Diagnosis not present

## 2020-09-30 DIAGNOSIS — F411 Generalized anxiety disorder: Secondary | ICD-10-CM | POA: Diagnosis not present

## 2020-09-30 LAB — CBC WITH DIFFERENTIAL/PLATELET
Basophils Absolute: 0 10*3/uL (ref 0.0–0.1)
Basophils Relative: 0.3 % (ref 0.0–3.0)
Eosinophils Absolute: 0.1 10*3/uL (ref 0.0–0.7)
Eosinophils Relative: 0.9 % (ref 0.0–5.0)
HCT: 41.2 % (ref 39.0–52.0)
Hemoglobin: 14.1 g/dL (ref 13.0–17.0)
Lymphocytes Relative: 23.8 % (ref 12.0–46.0)
Lymphs Abs: 2.1 10*3/uL (ref 0.7–4.0)
MCHC: 34.2 g/dL (ref 30.0–36.0)
MCV: 92.6 fl (ref 78.0–100.0)
Monocytes Absolute: 0.9 10*3/uL (ref 0.1–1.0)
Monocytes Relative: 9.7 % (ref 3.0–12.0)
Neutro Abs: 5.7 10*3/uL (ref 1.4–7.7)
Neutrophils Relative %: 65.3 % (ref 43.0–77.0)
Platelets: 206 10*3/uL (ref 150.0–400.0)
RBC: 4.45 Mil/uL (ref 4.22–5.81)
RDW: 13.4 % (ref 11.5–15.5)
WBC: 8.8 10*3/uL (ref 4.0–10.5)

## 2020-09-30 LAB — HEPATIC FUNCTION PANEL
ALT: 17 U/L (ref 0–53)
AST: 15 U/L (ref 0–37)
Albumin: 4.4 g/dL (ref 3.5–5.2)
Alkaline Phosphatase: 73 U/L (ref 39–117)
Bilirubin, Direct: 0.1 mg/dL (ref 0.0–0.3)
Total Bilirubin: 0.6 mg/dL (ref 0.2–1.2)
Total Protein: 6.9 g/dL (ref 6.0–8.3)

## 2020-09-30 LAB — BASIC METABOLIC PANEL
BUN: 13 mg/dL (ref 6–23)
CO2: 28 mEq/L (ref 19–32)
Calcium: 10.5 mg/dL (ref 8.4–10.5)
Chloride: 104 mEq/L (ref 96–112)
Creatinine, Ser: 0.99 mg/dL (ref 0.40–1.50)
GFR: 77.13 mL/min (ref >60.00)
Glucose, Bld: 90 mg/dL (ref 70–99)
Potassium: 4.1 mEq/L (ref 3.5–5.1)
Sodium: 140 mEq/L (ref 135–145)

## 2020-09-30 LAB — HEMOGLOBIN A1C: Hgb A1c MFr Bld: 6.1 % (ref 4.6–6.5)

## 2020-09-30 LAB — SARS-COV-2 IGG: SARS-COV-2 IgG: 0.03

## 2020-09-30 LAB — TSH: TSH: 1.66 u[IU]/mL (ref 0.35–4.50)

## 2020-09-30 LAB — VITAMIN B12: Vitamin B-12: 170 pg/mL — ABNORMAL LOW (ref 211–911)

## 2020-09-30 LAB — T3, FREE: T3, Free: 3 pg/mL (ref 2.3–4.2)

## 2020-09-30 LAB — T4, FREE: Free T4: 0.73 ng/dL (ref 0.60–1.60)

## 2020-09-30 NOTE — Progress Notes (Signed)
   Subjective:    Patient ID: Anthony Benton., male    DOB: 1950-09-09, 70 y.o.   MRN: [id number]  HPI Here to discuss some recent symptoms of feeling hot or cold, and he is curious as to whether he has been exposed to the Covid virus (he has never felt sick).    Review of Systems  Constitutional: Negative.   Respiratory: Negative.   Cardiovascular: Negative.   Gastrointestinal: Negative.   Endocrine: Positive for cold intolerance and heat intolerance.  Genitourinary: Negative.        Objective:   Physical Exam Constitutional:      Appearance: Normal appearance.  Cardiovascular:     Rate and Rhythm: Normal rate and regular rhythm.     Pulses: Normal pulses.     Heart sounds: Normal heart sounds.  Pulmonary:     Effort: Pulmonary effort is normal.     Breath sounds: Normal breath sounds.  Neurological:     Mental Status: He is alert.           Assessment & Plan:  His anxiety seems to be stable, though he has trouble sleeping at times. The HTN is stable. We will get labs to check his B12 level, thyoid levels, etc. Check for Covid antibodies.  Alysia Penna, MD

## 2020-09-30 NOTE — Addendum Note (Signed)
Addended by: Trenda Moots on: 9/37/3428 01:43 PM   Modules accepted: Orders

## 2020-10-07 ENCOUNTER — Telehealth: Payer: Self-pay | Admitting: Family Medicine

## 2020-10-07 NOTE — Telephone Encounter (Signed)
Patient is calling and wanted to see if lab results were back yet, please advise. CB is [phone]

## 2020-10-10 NOTE — Telephone Encounter (Signed)
LVM for call back to discuss results

## 2020-10-11 ENCOUNTER — Other Ambulatory Visit: Payer: Self-pay

## 2020-10-11 DIAGNOSIS — E538 Deficiency of other specified B group vitamins: Secondary | ICD-10-CM

## 2020-10-11 NOTE — Telephone Encounter (Addendum)
Spoke with patient about results, per Dr. Sarajane Jews patient to start on weekly B-12 injections.   Will call to schedule nurse visit, also three month lab appointment to be scheduled

## 2020-10-12 ENCOUNTER — Ambulatory Visit (INDEPENDENT_AMBULATORY_CARE_PROVIDER_SITE_OTHER): Payer: Medicare HMO | Admitting: *Deleted

## 2020-10-12 DIAGNOSIS — E538 Deficiency of other specified B group vitamins: Secondary | ICD-10-CM

## 2020-10-12 MED ORDER — CYANOCOBALAMIN 1000 MCG/ML IJ SOLN
1000.0000 ug | Freq: Once | INTRAMUSCULAR | Status: AC
  Administered 2020-10-12: 1000 ug via INTRAMUSCULAR

## 2020-10-12 NOTE — Progress Notes (Signed)
Per orders of Dr. Fry, injection of B12 given by Doryan Bahl. Patient tolerated injection well. 

## 2020-10-18 ENCOUNTER — Other Ambulatory Visit: Payer: Self-pay

## 2020-10-18 ENCOUNTER — Ambulatory Visit (INDEPENDENT_AMBULATORY_CARE_PROVIDER_SITE_OTHER): Payer: Medicare HMO | Admitting: *Deleted

## 2020-10-18 DIAGNOSIS — E538 Deficiency of other specified B group vitamins: Secondary | ICD-10-CM | POA: Diagnosis not present

## 2020-10-18 MED ORDER — CYANOCOBALAMIN 1000 MCG/ML IJ SOLN
1000.0000 ug | Freq: Once | INTRAMUSCULAR | Status: AC
  Administered 2020-10-18: 1000 ug via INTRAMUSCULAR

## 2020-10-18 NOTE — Progress Notes (Signed)
Per orders of Dr. Fry, injection of B12 given by Jonavon Trieu. Patient tolerated injection well. 

## 2020-10-27 ENCOUNTER — Ambulatory Visit (INDEPENDENT_AMBULATORY_CARE_PROVIDER_SITE_OTHER): Payer: Medicare HMO | Admitting: *Deleted

## 2020-10-27 ENCOUNTER — Other Ambulatory Visit: Payer: Self-pay

## 2020-10-27 DIAGNOSIS — E538 Deficiency of other specified B group vitamins: Secondary | ICD-10-CM | POA: Diagnosis not present

## 2020-10-27 MED ORDER — CYANOCOBALAMIN 1000 MCG/ML IJ SOLN
1000.0000 ug | Freq: Once | INTRAMUSCULAR | Status: AC
  Administered 2020-10-27: 1000 ug via INTRAMUSCULAR

## 2020-10-27 NOTE — Progress Notes (Signed)
Per orders of Dr. Fry, injection of B12 given by Aidon Klemens. Patient tolerated injection well. 

## 2020-10-28 ENCOUNTER — Telehealth: Payer: Self-pay | Admitting: Family Medicine

## 2020-10-28 NOTE — Telephone Encounter (Signed)
Left message for patient to call back and schedule Medicare Annual Wellness Visit (AWV) either virtually or in office.   Last AWV 11/08/17 please schedule at anytime with [facility] Nurse Health Advisor 1 or 2   This should be a 45 minute visit.

## 2020-11-02 ENCOUNTER — Ambulatory Visit: Payer: Medicare HMO

## 2020-11-02 ENCOUNTER — Other Ambulatory Visit: Payer: Self-pay

## 2020-11-02 ENCOUNTER — Ambulatory Visit (INDEPENDENT_AMBULATORY_CARE_PROVIDER_SITE_OTHER): Payer: Medicare HMO

## 2020-11-02 DIAGNOSIS — E538 Deficiency of other specified B group vitamins: Secondary | ICD-10-CM | POA: Diagnosis not present

## 2020-11-02 MED ORDER — CYANOCOBALAMIN 1000 MCG/ML IJ SOLN
1000.0000 ug | Freq: Once | INTRAMUSCULAR | Status: AC
Start: 2020-11-02 — End: 2020-11-02
  Administered 2020-11-02: 1000 ug via INTRAMUSCULAR

## 2020-11-02 NOTE — Progress Notes (Signed)
Per orders of Dr. Fry, injection of Cyanocobalamin 1,000 mcg/mL given by Makira Holleman N Abdon Petrosky. Patient tolerated injection well.  

## 2020-11-03 ENCOUNTER — Ambulatory Visit: Payer: Medicare HMO

## 2020-11-03 NOTE — Progress Notes (Signed)
Please co-sign

## 2020-11-07 DIAGNOSIS — M545 Low back pain, unspecified: Secondary | ICD-10-CM | POA: Diagnosis not present

## 2020-11-09 ENCOUNTER — Ambulatory Visit (INDEPENDENT_AMBULATORY_CARE_PROVIDER_SITE_OTHER): Payer: Medicare HMO | Admitting: *Deleted

## 2020-11-09 ENCOUNTER — Other Ambulatory Visit: Payer: Self-pay

## 2020-11-09 DIAGNOSIS — E538 Deficiency of other specified B group vitamins: Secondary | ICD-10-CM | POA: Diagnosis not present

## 2020-11-09 MED ORDER — CYANOCOBALAMIN 1000 MCG/ML IJ SOLN
1000.0000 ug | Freq: Once | INTRAMUSCULAR | Status: AC
  Administered 2020-11-09: 1000 ug via INTRAMUSCULAR

## 2020-11-09 NOTE — Progress Notes (Signed)
Per orders of Dr. Sarajane Jews, injection of  given by Westley Hummer. Patient tolerated injection well.

## 2020-11-10 ENCOUNTER — Telehealth: Payer: Self-pay | Admitting: Family Medicine

## 2020-11-10 NOTE — Telephone Encounter (Signed)
Pt call and stated he have so much pain after taken the B-12 shot and want dr.Fry to tell him what to do because he didn't have this back pain until he started taken the B-12 shot pt stated he want a call back.

## 2020-11-10 NOTE — Telephone Encounter (Signed)
Last injection b-12 injection- 11/09/20

## 2020-11-11 NOTE — Telephone Encounter (Signed)
Lvm for call back for below message

## 2020-11-11 NOTE — Telephone Encounter (Signed)
Spoke with pt advise of Dr Sarajane Jews advise verbalized understanding

## 2020-11-11 NOTE — Telephone Encounter (Signed)
Tell him to stop the shots and instead start taking 1000 mcg B12 by mouth OTC every day

## 2020-11-16 ENCOUNTER — Ambulatory Visit: Payer: Medicare HMO

## 2020-12-02 ENCOUNTER — Ambulatory Visit: Payer: Medicare HMO | Admitting: Family Medicine

## 2020-12-05 ENCOUNTER — Other Ambulatory Visit: Payer: Self-pay

## 2020-12-05 ENCOUNTER — Ambulatory Visit (INDEPENDENT_AMBULATORY_CARE_PROVIDER_SITE_OTHER): Payer: Medicare HMO | Admitting: Family Medicine

## 2020-12-05 ENCOUNTER — Encounter: Payer: Self-pay | Admitting: Family Medicine

## 2020-12-05 VITALS — BP 164/98 | HR 82 | Temp 97.8°F | Ht 69.0 in | Wt 184.0 lb

## 2020-12-05 DIAGNOSIS — I10 Essential (primary) hypertension: Secondary | ICD-10-CM

## 2020-12-05 DIAGNOSIS — R5383 Other fatigue: Secondary | ICD-10-CM | POA: Diagnosis not present

## 2020-12-05 DIAGNOSIS — E538 Deficiency of other specified B group vitamins: Secondary | ICD-10-CM | POA: Diagnosis not present

## 2020-12-05 DIAGNOSIS — E782 Mixed hyperlipidemia: Secondary | ICD-10-CM

## 2020-12-05 LAB — VITAMIN D 25 HYDROXY (VIT D DEFICIENCY, FRACTURES): VITD: 35.23 ng/mL (ref 30.00–100.00)

## 2020-12-05 LAB — BASIC METABOLIC PANEL
BUN: 9 mg/dL (ref 6–23)
CO2: 27 mEq/L (ref 19–32)
Calcium: 10.3 mg/dL (ref 8.4–10.5)
Chloride: 102 mEq/L (ref 96–112)
Creatinine, Ser: 0.91 mg/dL (ref 0.40–1.50)
GFR: 85.23 mL/min (ref >60.00)
Glucose, Bld: 90 mg/dL (ref 70–99)
Potassium: 4 mEq/L (ref 3.5–5.1)
Sodium: 139 mEq/L (ref 135–145)

## 2020-12-05 LAB — MAGNESIUM: Magnesium: 2 mg/dL (ref 1.5–2.5)

## 2020-12-05 NOTE — Patient Instructions (Signed)
Health Maintenance Due  Topic Date Due   COVID-19 Vaccine (1) Never done   Hepatitis C Screening  Never done   TETANUS/TDAP  Never done   COLONOSCOPY (Pts 45-54yrs Insurance coverage will need to be confirmed)  Never done   Zoster Vaccines- Shingrix (1 of 2) Never done   PNA vac Low Risk Adult (2 of 2 - PPSV23) 11/09/2018    Depression screen PHQ 2/9 11/08/2017  Decreased Interest 0  Down, Depressed, Hopeless 0  PHQ - 2 Score 0

## 2020-12-05 NOTE — Addendum Note (Signed)
Addended by: Amanda Cockayne on: 12/05/2020 02:25 PM   Modules accepted: Orders

## 2020-12-05 NOTE — Progress Notes (Signed)
   Subjective:    Patient ID: Anthony Pine., male    DOB: 1950-11-16, 70 y.o.   MRN: [id number]  HPI Here to follow up on fatigue. We saw him on 09-30-20 and we got a lot of lab work which was remarkable for a low B12 at 170. We started him on B12 shots but after 5 weeks of shots, he told us he wanted to stop them because they made him dizzy. We then stopped the shots and advised him to take a B12 tablet daily, but he has not started these yet. Today he again complains of severe generalized fatigue. He also has some dizziness when he gets up out of bed or out of a chair. This goes away in a few seconds.    Review of Systems  Constitutional:  Positive for fatigue.  Respiratory: Negative.    Cardiovascular: Negative.   Gastrointestinal: Negative.   Genitourinary: Negative.   Neurological:  Positive for dizziness. Negative for headaches.  Psychiatric/Behavioral:  The patient is nervous/anxious.       Objective:   Physical Exam Constitutional:      Appearance: Normal appearance.  Cardiovascular:     Rate and Rhythm: Normal rate and regular rhythm.     Pulses: Normal pulses.     Heart sounds: Normal heart sounds.  Pulmonary:     Effort: Pulmonary effort is normal.     Breath sounds: Normal breath sounds.  Neurological:     General: No focal deficit present.     Mental Status: He is alert and oriented to person, place, and time.  Psychiatric:        Behavior: Behavior normal.        Thought Content: Thought content normal.     Comments: He is very anxious           Assessment & Plan:  His BP is elevated today but I suspect this is mostly due to anxiety. His fatigue is certainly related to the B12 deficiency, although other factors may be playing arole. We will check a B12 level today. He has been on a high dose statin for years, so I advised him to stop this for one month to see if this is part of his fatigue.  Alysia Penna, MD

## 2020-12-06 ENCOUNTER — Other Ambulatory Visit: Payer: Self-pay

## 2020-12-06 ENCOUNTER — Other Ambulatory Visit: Payer: Medicare HMO

## 2020-12-06 ENCOUNTER — Other Ambulatory Visit: Payer: Self-pay | Admitting: Family Medicine

## 2020-12-06 DIAGNOSIS — I10 Essential (primary) hypertension: Secondary | ICD-10-CM

## 2020-12-07 ENCOUNTER — Encounter: Payer: Self-pay | Admitting: Family Medicine

## 2020-12-07 ENCOUNTER — Ambulatory Visit (INDEPENDENT_AMBULATORY_CARE_PROVIDER_SITE_OTHER): Payer: Medicare HMO | Admitting: Family Medicine

## 2020-12-07 ENCOUNTER — Telehealth: Payer: Self-pay | Admitting: Family Medicine

## 2020-12-07 VITALS — BP 160/80 | HR 65 | Temp 98.4°F | Ht 69.0 in | Wt 180.8 lb

## 2020-12-07 DIAGNOSIS — M791 Myalgia, unspecified site: Secondary | ICD-10-CM

## 2020-12-07 DIAGNOSIS — E538 Deficiency of other specified B group vitamins: Secondary | ICD-10-CM

## 2020-12-07 DIAGNOSIS — N200 Calculus of kidney: Secondary | ICD-10-CM

## 2020-12-07 DIAGNOSIS — I10 Essential (primary) hypertension: Secondary | ICD-10-CM | POA: Diagnosis not present

## 2020-12-07 LAB — VITAMIN B12: Vitamin B-12: 436 pg/mL (ref 211–911)

## 2020-12-07 NOTE — Telephone Encounter (Signed)
The patient called to get a referral to see Dr. Irine Seal for his kidney stones. He has been having a lot of back pain and he had to pass the kidney stones about a month and a half ago.   Please advise

## 2020-12-07 NOTE — Addendum Note (Signed)
Addended by: Amanda Cockayne on: 12/07/2020 11:30 AM   Modules accepted: Orders

## 2020-12-07 NOTE — Progress Notes (Signed)
   Subjective:    Patient ID: Anthony Benton., male    DOB: 1950/07/16, 70 y.o.   MRN: [id number]  HPI Here to follow up on HTN and fatigue. He had some labs checked a few days ago and these were normal, including a magnesium level. We had intended to have a B12 check ed as well, but this was not done. He feels about the same.   Review of Systems  Constitutional:  Positive for fatigue.  Respiratory: Negative.    Cardiovascular: Negative.   Gastrointestinal: Negative.   Genitourinary: Negative.   Musculoskeletal:  Positive for myalgias.      Objective:   Physical Exam Constitutional:      General: He is not in acute distress.    Appearance: Normal appearance.  Cardiovascular:     Rate and Rhythm: Normal rate and regular rhythm.     Pulses: Normal pulses.     Heart sounds: Normal heart sounds.  Pulmonary:     Effort: Pulmonary effort is normal.     Breath sounds: Normal breath sounds.  Neurological:     Mental Status: He is alert.          Assessment & Plan:  For the fatigue, we will check a B12 level. He has isolated systolic HTN , and I proposed adding a medication to his regimen. However he declined, so we will continue with the current medications. For the cramps, he will try taking magnesium 400 mg daily.  Alysia Penna, MD

## 2020-12-08 ENCOUNTER — Telehealth: Payer: Self-pay | Admitting: Family Medicine

## 2020-12-08 DIAGNOSIS — N200 Calculus of kidney: Secondary | ICD-10-CM | POA: Insufficient documentation

## 2020-12-08 NOTE — Telephone Encounter (Signed)
I did the referral 

## 2020-12-08 NOTE — Telephone Encounter (Signed)
Spoke with the patient he is aware that the referral has been placed.

## 2020-12-08 NOTE — Addendum Note (Signed)
Addended by: Alysia Penna A on: 12/08/2020 12:12 PM   Modules accepted: Orders

## 2020-12-08 NOTE — Telephone Encounter (Signed)
Results have been discussed with the patient. Please see result notes for further documentation.

## 2020-12-08 NOTE — Telephone Encounter (Signed)
Please advise. Freeman for a referral?

## 2020-12-08 NOTE — Telephone Encounter (Signed)
Pt is returning your call  about his labsand will be waiting for you to call him back .

## 2020-12-09 ENCOUNTER — Telehealth: Payer: Self-pay

## 2020-12-09 NOTE — Telephone Encounter (Signed)
PA was approved for Diazepam 5 mg tablets  (Key: [id number])

## 2020-12-12 DIAGNOSIS — N281 Cyst of kidney, acquired: Secondary | ICD-10-CM | POA: Diagnosis not present

## 2020-12-12 DIAGNOSIS — R972 Elevated prostate specific antigen [PSA]: Secondary | ICD-10-CM | POA: Diagnosis not present

## 2020-12-12 DIAGNOSIS — N209 Urinary calculus, unspecified: Secondary | ICD-10-CM | POA: Diagnosis not present

## 2020-12-12 DIAGNOSIS — Z87442 Personal history of urinary calculi: Secondary | ICD-10-CM | POA: Diagnosis not present

## 2020-12-12 DIAGNOSIS — M47816 Spondylosis without myelopathy or radiculopathy, lumbar region: Secondary | ICD-10-CM | POA: Diagnosis not present

## 2020-12-12 DIAGNOSIS — R1084 Generalized abdominal pain: Secondary | ICD-10-CM | POA: Diagnosis not present

## 2020-12-12 DIAGNOSIS — N210 Calculus in bladder: Secondary | ICD-10-CM | POA: Diagnosis not present

## 2020-12-16 ENCOUNTER — Telehealth: Payer: Self-pay | Admitting: Family Medicine

## 2020-12-16 NOTE — Telephone Encounter (Signed)
Please advise 

## 2020-12-16 NOTE — Telephone Encounter (Signed)
Pt would like to have a copy of the following labs 12/05/2020 and 12/07/2020 printed off so that he can come by and picked them up.

## 2020-12-16 NOTE — Telephone Encounter (Signed)
Pt had further questions about his lab work and he is wanting to know if he should continue to take the OTC B-12 supplement due to his B-12 is in normal range.  Pt is aware that the provider is not in the office today and will be back on Monday 12/19/2020 he stated that he will just wait to get his printed lab result at that time (Monday 12/16/2020)

## 2020-12-19 ENCOUNTER — Telehealth: Payer: Self-pay

## 2020-12-19 ENCOUNTER — Other Ambulatory Visit: Payer: Self-pay | Admitting: Family Medicine

## 2020-12-19 ENCOUNTER — Telehealth: Payer: Self-pay | Admitting: Family Medicine

## 2020-12-19 NOTE — Telephone Encounter (Signed)
Yes he should continue to take the OTC B12 supplement every day

## 2020-12-19 NOTE — Telephone Encounter (Signed)
Pt passed by the office to pick up a copy of labs and documentation stating to continue taking B12 supplement

## 2020-12-19 NOTE — Telephone Encounter (Signed)
Patient is concerned about cholesterol going back up and would rather take his Lipitor again.  He wants to know when he can start back taking it again?  Pt is requesting a call back.

## 2020-12-19 NOTE — Telephone Encounter (Signed)
Please advise 

## 2020-12-20 NOTE — Telephone Encounter (Signed)
He can start back on it immediately

## 2020-12-21 NOTE — Telephone Encounter (Signed)
Pt is aware to continue taking B12 supplement daily

## 2020-12-23 NOTE — Telephone Encounter (Signed)
Spoke with pt advised to continue taking Lipitor, pt verbalized understanding

## 2020-12-25 ENCOUNTER — Other Ambulatory Visit: Payer: Self-pay | Admitting: Family Medicine

## 2021-01-01 ENCOUNTER — Other Ambulatory Visit: Payer: Self-pay | Admitting: Family Medicine

## 2021-01-01 ENCOUNTER — Other Ambulatory Visit: Payer: Self-pay | Admitting: Interventional Cardiology

## 2021-02-22 DIAGNOSIS — N401 Enlarged prostate with lower urinary tract symptoms: Secondary | ICD-10-CM | POA: Diagnosis not present

## 2021-02-22 DIAGNOSIS — R972 Elevated prostate specific antigen [PSA]: Secondary | ICD-10-CM | POA: Diagnosis not present

## 2021-02-22 DIAGNOSIS — R3915 Urgency of urination: Secondary | ICD-10-CM | POA: Diagnosis not present

## 2021-02-22 DIAGNOSIS — N200 Calculus of kidney: Secondary | ICD-10-CM | POA: Diagnosis not present

## 2021-02-28 DIAGNOSIS — N200 Calculus of kidney: Secondary | ICD-10-CM | POA: Diagnosis not present

## 2021-03-08 ENCOUNTER — Other Ambulatory Visit: Payer: Self-pay | Admitting: Family Medicine

## 2021-03-21 DIAGNOSIS — N200 Calculus of kidney: Secondary | ICD-10-CM | POA: Diagnosis not present

## 2021-03-21 DIAGNOSIS — R972 Elevated prostate specific antigen [PSA]: Secondary | ICD-10-CM | POA: Diagnosis not present

## 2021-03-21 DIAGNOSIS — E212 Other hyperparathyroidism: Secondary | ICD-10-CM | POA: Diagnosis not present

## 2021-04-02 NOTE — Progress Notes (Signed)
Cardiology Office Note   Date:  04/03/2021   ID:  Anthony Pine., DOB February 05, 1951, MRN [id number]  PCP:  Laurey Morale, MD    No chief complaint on file.  CAD  Wt Readings from Last 3 Encounters:  04/03/21 174 lb 6.4 oz (79.1 kg)  12/07/20 180 lb 12.8 oz (82 kg)  12/05/20 184 lb (83.5 kg)       History of Present Illness: Anthony Hetzer. is a 70 y.o. male    with history of CAD status post DES to the RCA times 06/2010 at outside hospital.  Also has HLD, hypertension.   He had 2 stents to the RCA, 3.5x 15 Xience and 3.5 x 8 Xience in 02/2011 at an outside hospital.  He was flown by helicopter but he did not have a heart attack.  His initial sx were associated with walking on his long  Driveway, which he would do for exercise.   He has high cholesterol.  It runs in the family.  Mother's cholesterol is over 300.    His sister passed away in 11/10/22 from bone cancer.  His mother was in a nursing home in 2018.   He remains actively involved in his church and is now a born again [place].   In 2019, it was noted: he was " Separated from wife, under a lot of stress, says he's homeless. Not eating properly, very depressed. Several friends have died. Staying with different people from church. 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 "   He found a place in Feb 2020.  He feels the stress continues.    He was started on lisinopril, but feels that he was having side effects. He thinks he has bone pain.     He does not check his BP at home.  He has not been exercise. He had issues with his ex-wife.  He reports being lonely.   Has an enlarged prostate followed by Dr. Jeffie Pollock.   Denies : Chest pain. Dizziness. Leg edema. Nitroglycerin use. Orthopnea. Palpitations. Paroxysmal nocturnal dyspnea. Shortness of breath. Syncope.      Bothered most by emotional stress and sadness. Cold weather  makes it worse as well.  He is considering joining a gym.     Past Medical History:  Diagnosis Date   Arthritis    neck, knees; seen Dr. Rhona Raider, Norco 3-4 per day, 14 yrs, stable   Coronary artery disease    Hyperlipidemia    Hypertension    Insomnia    OCD (obsessive compulsive disorder)    with anxiety   Stuttering    uses Diazepam   Tobacco abuse 1999, quit    Past Surgical History:  Procedure Laterality Date   [place]  03/19/11   stent X 2, in [place]  10/12   [place] EXTRACTION       Current Outpatient Medications  Medication Sig Dispense Refill   Acetaminophen (TYLENOL EXTRA STRENGTH PO) Take 500 mg by mouth daily as needed (pain).      aspirin 81 MG tablet Take 81 mg by mouth daily.     atorvastatin (LIPITOR) 80 MG tablet TAKE 1 TABLET (80 MG TOTAL) BY MOUTH DAILY. *LABS REQUIRED FOR FUTURE REFILLS OF MEDICINE. 90 tablet 0   diazepam (VALIUM) 5 MG tablet  TAKE 1 TABLET BY MOUTH THREE TIMES DAILY AS NEEDED FOR ANXIETY AND  SPEECH 90 tablet 5   lisinopril (ZESTRIL) 20 MG tablet TAKE 1 TABLET BY MOUTH EVERY DAY 90 tablet 3   metoprolol succinate (TOPROL-XL) 50 MG 24 hr tablet TAKE 1 TABLET BY MOUTH DAILY. TAKE WITH OR IMMEDIATELY FOLLOWING A MEAL. 90 tablet 1   Multiple Vitamin (MULTIVITAMIN) tablet Take 1 tablet by mouth daily.     nitroGLYCERIN (NITROSTAT) 0.4 MG SL tablet Place 1 tablet (0.4 mg total) under the tongue every 5 (five) minutes as needed for chest pain. 25 tablet 3   Omega-3 Fatty Acids (FISH OIL) 1000 MG CAPS Take 1,000 mg by mouth daily.     Vitamin D, Ergocalciferol, (DRISDOL) 1.25 MG (50000 UNIT) CAPS capsule Take 50,000 Units by mouth every 7 (seven) days.     No current facility-administered medications for this visit.    Allergies:   Penicillins and Doxazosin    Social History:  The patient  reports that he has quit smoking. He has never used smokeless  tobacco. He reports that he does not drink alcohol.   Family History:  The patient's family history includes Arthritis in his father and sister; Breast cancer in his sister; Heart attack in his father; Hypertension in his father; Prostate cancer in his father.    ROS:  Please see the history of present illness.   Otherwise, review of systems are positive for sadness, kidney stones.   All other systems are reviewed and negative.    PHYSICAL EXAM: VS:  BP (!) 160/92   Pulse 75   Ht 5\' 9"  (1.753 m)   Wt 174 lb 6.4 oz (79.1 kg)   SpO2 93%   BMI 25.75 kg/m  , BMI Body mass index is 25.75 kg/m. GEN: Well nourished, well developed, in no acute distress HEENT: normal Neck: no JVD, carotid bruits, or masses Cardiac: RRR; no murmurs, rubs, or gallops,no edema  Respiratory:  clear to auscultation bilaterally, normal work of breathing GI: soft, nontender, nondistended, + BS MS: no deformity or atrophy Skin: warm and dry, no rash Neuro:  Strength and sensation are intact Psych: euthymic mood, full affect   EKG:   The ekg ordered today demonstrates NSR, RBBB   Recent Labs: 09/30/2020: ALT 17; Hemoglobin 14.1; Platelets 206.0; TSH 1.66 12/05/2020: BUN 9; Creatinine, Ser 0.91; Magnesium 2.0; Potassium 4.0; Sodium 139   Lipid Panel    Component Value Date/Time   CHOL 219 (H) 03/02/2020 1455   TRIG 238 (H) 03/02/2020 1455   HDL 52 03/02/2020 1455   CHOLHDL 4.2 03/02/2020 1455   VLDL 41.2 (H) 12/31/2018 1039   LDLCALC 129 (H) 03/02/2020 1455   LDLDIRECT 99.0 12/31/2018 1039     Other studies Reviewed: Additional studies/ records that were reviewed today with results demonstrating: labs reviewed.   ASSESSMENT AND PLAN:  CAD: No angina. Continue aggressive secondary prevention.  HTN: High today.  Low salt diet.  Increase lisinopril to 40 mg daily.  Will check electrolytes in about a week. Hyperlipidemia: LDL 129 in 2021. Needs repeat labs. Was prescribed atorvastatin 80 mg daily.   He states he was taken off of this for a month.  He is back on it.  We will see where his lipids are.  Healthy diet recommended as well.  High-fiber, whole food, plant-based diet recommended. Anxiety: Worsened with cold weather and the issues with his wife.    Current medicines are reviewed at length with the patient  today.  The patient concerns regarding his medicines were addressed.  The following changes have been made:    Labs/ tests ordered today include:  No orders of the defined types were placed in this encounter.   Recommend 150 minutes/week of aerobic exercise Low fat, low carb, high fiber diet recommended  Disposition:   FU in 1 year   Signed, Larae Grooms, MD  04/03/2021 11:34 AM    [facility] [place], [place], [place]  [address] Phone: [phone]; Fax: [phone]

## 2021-04-03 ENCOUNTER — Other Ambulatory Visit: Payer: Self-pay

## 2021-04-03 ENCOUNTER — Encounter: Payer: Self-pay | Admitting: Interventional Cardiology

## 2021-04-03 ENCOUNTER — Ambulatory Visit: Payer: Medicare HMO | Admitting: Interventional Cardiology

## 2021-04-03 VITALS — BP 160/92 | HR 75 | Ht 69.0 in | Wt 174.4 lb

## 2021-04-03 DIAGNOSIS — I10 Essential (primary) hypertension: Secondary | ICD-10-CM

## 2021-04-03 DIAGNOSIS — E782 Mixed hyperlipidemia: Secondary | ICD-10-CM | POA: Diagnosis not present

## 2021-04-03 DIAGNOSIS — I2510 Atherosclerotic heart disease of native coronary artery without angina pectoris: Secondary | ICD-10-CM | POA: Diagnosis not present

## 2021-04-03 DIAGNOSIS — F411 Generalized anxiety disorder: Secondary | ICD-10-CM

## 2021-04-03 DIAGNOSIS — R69 Illness, unspecified: Secondary | ICD-10-CM | POA: Diagnosis not present

## 2021-04-03 MED ORDER — LISINOPRIL 40 MG PO TABS: 40.0000 mg | ORAL_TABLET | Freq: Every day | ORAL | 3 refills | Status: DC

## 2021-04-03 NOTE — Patient Instructions (Signed)
Medication Instructions:  Your physician has recommended you make the following change in your medication: Increase lisinopril to 40 mg by mouth daily  *If you need a refill on your cardiac medications before your next appointment, please call your pharmacy*   Lab Work: Your physician recommends that you return for lab work in: one week.  CMET and Lipid profile. This will be fasting.  The lab opens at 7:30 AM  If you have labs (blood work) drawn today and your tests are completely normal, you will receive your results only by: [place] (if you have MyChart) OR A paper copy in the mail If you have any lab test that is abnormal or we need to change your treatment, we will call you to review the results.   Testing/Procedures: none   Follow-Up: At [facility], you and your health needs are our priority.  As part of our continuing mission to provide you with exceptional heart care, we have created designated Provider Care Teams.  These Care Teams include your primary Cardiologist (physician) and Advanced Practice Providers (APPs -  Physician Assistants and Nurse Practitioners) who all work together to provide you with the care you need, when you need it.  We recommend signing up for the patient portal called "MyChart".  Sign up information is provided on this After Visit Summary.  MyChart is used to connect with patients for Virtual Visits (Telemedicine).  Patients are able to view lab/test results, encounter notes, upcoming appointments, etc.  Non-urgent messages can be sent to your provider as well.   To learn more about what you can do with MyChart, go to [email or web address].    Your next appointment:   12 month(s)  The format for your next appointment:   In Person  Provider:   Larae Grooms, MD     Other Instructions You have been referred to hypertension clinic in our office.  Please schedule new patient appointment for 2-3 weeks from now.   Check blood pressure  at home and keep record of readings.  Bring readings and blood pressure cuff to appointment in hypertension clinic

## 2021-04-10 ENCOUNTER — Ambulatory Visit: Payer: Medicare HMO

## 2021-04-10 ENCOUNTER — Other Ambulatory Visit: Payer: Self-pay

## 2021-04-10 ENCOUNTER — Other Ambulatory Visit: Payer: Medicare HMO | Admitting: *Deleted

## 2021-04-10 ENCOUNTER — Ambulatory Visit (INDEPENDENT_AMBULATORY_CARE_PROVIDER_SITE_OTHER): Payer: Medicare HMO | Admitting: *Deleted

## 2021-04-10 VITALS — BP 148/68 | Ht 69.0 in

## 2021-04-10 DIAGNOSIS — I2510 Atherosclerotic heart disease of native coronary artery without angina pectoris: Secondary | ICD-10-CM | POA: Diagnosis not present

## 2021-04-10 DIAGNOSIS — I10 Essential (primary) hypertension: Secondary | ICD-10-CM

## 2021-04-10 DIAGNOSIS — E782 Mixed hyperlipidemia: Secondary | ICD-10-CM

## 2021-04-10 NOTE — Patient Instructions (Addendum)
Your blood pressure with your personal automatic cuff was 148/78   (The nurse got 148/68 taking it manually)  Keep your currently scheduled follow up appointment on 12/5

## 2021-04-10 NOTE — Progress Notes (Signed)
  1.  Reason for visit: Patient walked into the office today wanting his BP cuff checked, so scheduled nurse visit.  2.  Name of MD requesting visit:  none, this is patient request visit  3. H&P:  see epic  4.  ROS related to problem:    5.  Assessment and plan per MD:   Patient walked into the office wanting his blood pressure cuff checked.  Placed pt on nurse visit to accommodate this. Left arm      Manual (taken by RN) - 140/68      Pt automatic monitor -- 148/78  Pt will continue monitoring his BP at home. He will keep his follow up on 12/5

## 2021-04-11 LAB — COMPREHENSIVE METABOLIC PANEL
ALT: 15 IU/L (ref 0–44)
AST: 17 IU/L (ref 0–40)
Albumin/Globulin Ratio: 1.8 (ref 1.2–2.2)
Albumin: 4.4 g/dL (ref 3.7–4.7)
Alkaline Phosphatase: 91 IU/L (ref 44–121)
BUN/Creatinine Ratio: 9 — ABNORMAL LOW (ref 10–24)
BUN: 9 mg/dL (ref 8–27)
Bilirubin Total: 0.6 mg/dL (ref 0.0–1.2)
CO2: 24 mmol/L (ref 20–29)
Calcium: 9.9 mg/dL (ref 8.6–10.2)
Chloride: 103 mmol/L (ref 96–106)
Creatinine, Ser: 0.99 mg/dL (ref 0.76–1.27)
Globulin, Total: 2.4 g/dL (ref 1.5–4.5)
Glucose: 98 mg/dL (ref 70–99)
Potassium: 4.2 mmol/L (ref 3.5–5.2)
Sodium: 140 mmol/L (ref 134–144)
Total Protein: 6.8 g/dL (ref 6.0–8.5)
eGFR: 81 mL/min/{1.73_m2} (ref >59)

## 2021-04-11 LAB — LIPID PANEL
Chol/HDL Ratio: 4.5 ratio (ref 0.0–5.0)
Cholesterol, Total: 179 mg/dL (ref 100–199)
HDL: 40 mg/dL (ref >39)
LDL Chol Calc (NIH): 107 mg/dL — ABNORMAL HIGH (ref 0–99)
Triglycerides: 182 mg/dL — ABNORMAL HIGH (ref 0–149)
VLDL Cholesterol Cal: 32 mg/dL (ref 5–40)

## 2021-04-12 ENCOUNTER — Telehealth: Payer: Self-pay

## 2021-04-12 DIAGNOSIS — E782 Mixed hyperlipidemia: Secondary | ICD-10-CM

## 2021-04-12 DIAGNOSIS — I2510 Atherosclerotic heart disease of native coronary artery without angina pectoris: Secondary | ICD-10-CM

## 2021-04-12 NOTE — Telephone Encounter (Signed)
-----   Message from Precious Gilding, RN sent at 04/12/2021  8:27 AM EST -----  ----- Message ----- From: Jettie Booze, MD Sent: 04/11/2021   5:45 PM EST To: Thompson Grayer, RN, [facility] Triage  LDL better but still above target.  Refer to lipid clinic. Other labs are stable.

## 2021-04-12 NOTE — Telephone Encounter (Signed)
Called pt reviewed results and MD recommendations.  Pt expresses that PCP instructed not to take atorvastatin for 1 month and this may have impacted LDL.  Pt agreeable to OV with lipid clinic.  Pt scheduled to see HTN on 04/24/21 will add Lipid clinic to appointment.  All questions answered and pt verbalizes understanding.

## 2021-04-20 ENCOUNTER — Other Ambulatory Visit: Payer: Self-pay | Admitting: Family Medicine

## 2021-04-24 ENCOUNTER — Other Ambulatory Visit: Payer: Self-pay

## 2021-04-24 ENCOUNTER — Ambulatory Visit (INDEPENDENT_AMBULATORY_CARE_PROVIDER_SITE_OTHER): Payer: Medicare HMO | Admitting: Pharmacist

## 2021-04-24 VITALS — BP 182/84 | HR 60

## 2021-04-24 DIAGNOSIS — E782 Mixed hyperlipidemia: Secondary | ICD-10-CM | POA: Diagnosis not present

## 2021-04-24 DIAGNOSIS — I10 Essential (primary) hypertension: Secondary | ICD-10-CM

## 2021-04-24 NOTE — Progress Notes (Signed)
Patient ID: Anthony Benton.                 DOB: [birth year]                    MRN: [id number]     HPI: Jerzy Roepke. is a 70 y.o. male patient referred to PharmD clinic by Dr. Irish Lack. PMH is significant for CAD status post DES to the RCA times 06/2010, HTN and HLD. He saw Dr. Irish Lack on 11/14. Lisinopril was increased to 40mg  daily. Repeat BMP stable.  Patient presents to clinic today. He talks a lot about the nurse who did his blood pressure check the last time he was here. He was upset because he was charged for the visit and thought the nurse was rude and rushed. He wanted to say something to the administrator but after telling the story several times apologized for letting it upset him and he didn't want to say anything. His blood pressure was very elevated which surprised him. States his back hurts but he doesn't want pain meds. 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 Doesn't go to the gym anymore, doesn't socialized. Seems very sad over his separation from his wife and states he has a lot of family stress. He does bring his home cuff which was found to be pretty accurate. He has not been checking his BP at home because he says it would make him upset. Although he keeps saying its normally 130-140 but then says he hasn't been checking.  Current Lipid Medications: atorvastatin 80mg  daily Current HTN Medications: metoprolol succinate 50mg  daily, lisinopril 40mg  daily Risk Factors: HTN, >65, ASCVD LDL goal: <55  Diet: did not get to discuss  Exercise: none  Family History: The patient's family history includes Arthritis in his father and sister; Breast cancer in his sister; Heart attack in his father; Hypertension in his father; Prostate cancer in his father.   Social History: The patient  reports that he has quit smoking. He has never used smokeless tobacco. He reports that he does not drink alcohol.     Labs: 04/10/21  TC 179, TG 182, HDL 40, LDL-C 107  Past Medical History:  Diagnosis Date   Arthritis    neck, knees; seen Dr. Rhona Raider, Norco 3-4 per day, 14 yrs, stable   Coronary artery disease    Hyperlipidemia    Hypertension    Insomnia    OCD (obsessive compulsive disorder)    with anxiety   Stuttering    uses Diazepam   Tobacco abuse 1999, quit    Current Outpatient Medications on File Prior to Visit  Medication Sig Dispense Refill   Acetaminophen (TYLENOL EXTRA STRENGTH PO) Take 500 mg by mouth daily as needed (pain).      aspirin 81 MG tablet Take 81 mg by mouth daily.     atorvastatin (LIPITOR) 80 MG tablet TAKE 1 TABLET (80 MG TOTAL) BY MOUTH DAILY. *LABS REQUIRED FOR FUTURE REFILLS OF MEDICINE. 90 tablet 0   diazepam (VALIUM) 5 MG tablet TAKE 1 TABLET BY MOUTH THREE TIMES DAILY AS NEEDED FOR ANXIETY AND  SPEECH 90 tablet 5   lisinopril (ZESTRIL) 40 MG tablet Take 1 tablet (40 mg total) by mouth daily. 90 tablet 3   metoprolol succinate (TOPROL-XL) 50 MG 24 hr tablet TAKE 1 TABLET BY MOUTH DAILY. TAKE WITH OR IMMEDIATELY FOLLOWING A MEAL. 90 tablet  1   Multiple Vitamin (MULTIVITAMIN) tablet Take 1 tablet by mouth daily.     nitroGLYCERIN (NITROSTAT) 0.4 MG SL tablet Place 1 tablet (0.4 mg total) under the tongue every 5 (five) minutes as needed for chest pain. 25 tablet 3   Omega-3 Fatty Acids (FISH OIL) 1000 MG CAPS Take 1,000 mg by mouth daily.     Vitamin D, Ergocalciferol, (DRISDOL) 1.25 MG (50000 UNIT) CAPS capsule Take 50,000 Units by mouth every 7 (seven) days.     No current facility-administered medications on file prior to visit.    Allergies  Allergen Reactions   Penicillins Anaphylaxis    DID THE REACTION INVOLVE: Swelling of the face/tongue/throat, SOB, or low BP? Anaphylaxis Sudden or severe rash/hives, skin peeling, or the inside of the mouth or nose? Y Did it require medical treatment? Y When did it last happen?  Over 40 years ago. If all above answers are "NO", may  proceed with cephalosporin use.    Doxazosin     Nightmares     Assessment/Plan:  1. Hyperlipidemia - LDL is above goal of <55. He seems surprised that his goal is <55. Michela Pitcher he was told for 15 years that it should be less than 40. I explained to him the new guidelines. Discussed PCSK9i but patient states that he will not take it. He lives alone and he's heard about those new medications and wont take them. I did also recommend ezetimibe although we wont see the reduction we need. Patient will think about it. I gave him the names of the medications I recommended.  2.HTN- BP is significantly elevated. Patient asks if stress and pain can increase BP. I advised it can, but that I would still like to add another medications due to the elevation in his BP. Patient did not want to do that at this time. He was advised to increase his exercise to help with BP and mental health. I've asked him to check BP at home a few times a week. He says he has white coat syndrome. He is agreeable to check at home. Follow up in 3 weeks. He has my phone number if he changes his mind and would like to add amlodipine 5mg  daily.   Thank you,   Ramond Dial, Pharm.D, BCPS, CPP [facility]  [address], [place], [place] [address]  Phone: [phone]; Fax: [phone]

## 2021-04-24 NOTE — Patient Instructions (Addendum)
I would recommend adding amlodipine 5mg  daily for your blood pressure. Continue taking metoprolol succinate 50mg  daily, lisinopril 40mg  daily Your blood pressure goal is <130/80  For your cholesterol, I would recommend adding Repatha or Praluent. If you do not want to add those, then I would recommend adding ezetimibe 10mg  daily. Continue taking atorvastatin 80mg  daily Your LDL-C goal is <55  Please check your blood pressure at home a few times a week  Call me at [phone] if you change your mind

## 2021-05-01 ENCOUNTER — Telehealth: Payer: Self-pay | Admitting: Family Medicine

## 2021-05-01 NOTE — Telephone Encounter (Signed)
Last OV- 12/05/20 Medication has currently been d/ced of med list  No future office visit scheduled.  Can this patient receive a refill?

## 2021-05-01 NOTE — Telephone Encounter (Signed)
Patient is needing a refill on Hydrocodone 5/325.  Patient usually gets 90 tablets, however he only takes them as needed.  [facility] [place]

## 2021-05-02 NOTE — Telephone Encounter (Signed)
He will need a PMV for this

## 2021-05-02 NOTE — Telephone Encounter (Signed)
Left pt a message to call the office and schedule PMV per Dr Sarajane Jews

## 2021-05-08 ENCOUNTER — Telehealth: Payer: Self-pay | Admitting: Pharmacist

## 2021-05-08 NOTE — Telephone Encounter (Signed)
Pt called asking what cough medication he can take. Advised he can take Delsum. Patient had already gone to [facility] and bought Coricidum HBP cold and flu. Advised that was ok to take

## 2021-05-09 ENCOUNTER — Emergency Department (HOSPITAL_COMMUNITY): Payer: Medicare HMO

## 2021-05-09 ENCOUNTER — Encounter (HOSPITAL_COMMUNITY): Payer: Self-pay | Admitting: Emergency Medicine

## 2021-05-09 ENCOUNTER — Emergency Department (HOSPITAL_COMMUNITY)
Admission: EM | Admit: 2021-05-09 | Discharge: 2021-05-09 | Disposition: A | Discharge status: Home / Self Care | Payer: Medicare HMO | Attending: Emergency Medicine | Admitting: Emergency Medicine

## 2021-05-09 DIAGNOSIS — J101 Influenza due to other identified influenza virus with other respiratory manifestations: Secondary | ICD-10-CM | POA: Diagnosis not present

## 2021-05-09 DIAGNOSIS — E039 Hypothyroidism, unspecified: Secondary | ICD-10-CM | POA: Diagnosis not present

## 2021-05-09 DIAGNOSIS — I2510 Atherosclerotic heart disease of native coronary artery without angina pectoris: Secondary | ICD-10-CM | POA: Insufficient documentation

## 2021-05-09 DIAGNOSIS — I10 Essential (primary) hypertension: Secondary | ICD-10-CM | POA: Diagnosis not present

## 2021-05-09 DIAGNOSIS — Z79899 Other long term (current) drug therapy: Secondary | ICD-10-CM | POA: Diagnosis not present

## 2021-05-09 DIAGNOSIS — Z87891 Personal history of nicotine dependence: Secondary | ICD-10-CM | POA: Insufficient documentation

## 2021-05-09 DIAGNOSIS — Z20822 Contact with and (suspected) exposure to covid-19: Secondary | ICD-10-CM | POA: Insufficient documentation

## 2021-05-09 DIAGNOSIS — R059 Cough, unspecified: Secondary | ICD-10-CM | POA: Diagnosis not present

## 2021-05-09 DIAGNOSIS — Z7982 Long term (current) use of aspirin: Secondary | ICD-10-CM | POA: Diagnosis not present

## 2021-05-09 LAB — RESP PANEL BY RT-PCR (FLU A&B, COVID) ARPGX2
Influenza A by PCR: POSITIVE — AB
Influenza B by PCR: NEGATIVE
SARS Coronavirus 2 by RT PCR: NEGATIVE

## 2021-05-09 MED ORDER — BENZONATATE 100 MG PO CAPS: 100.0000 mg | ORAL_CAPSULE | Freq: Three times a day (TID) | ORAL | 0 refills | Status: DC | PRN

## 2021-05-09 MED ORDER — ACETAMINOPHEN 500 MG PO TABS
1000.0000 mg | ORAL_TABLET | Freq: Once | ORAL | Status: AC
  Administered 2021-05-09: 10:00:00 1000 mg via ORAL
  Filled 2021-05-09: qty 2

## 2021-05-09 NOTE — Discharge Instructions (Addendum)
If you develop high fever, coughing up blood, trouble breathing, intractable vomiting, or any other new/concerning symptoms then return to the ER for evaluation.

## 2021-05-09 NOTE — ED Provider Notes (Signed)
[facility] DEPT Provider Note   CSN: [id number] Arrival date & time: 05/09/21  9741     History Chief Complaint  Patient presents with   Sore Throat   Headache    Anthony Benton. is a 70 y.o. male.  HPI 70 year old male presents with cough.  He has been coughing for about 2 days.  He is also had headache, sore throat, vomiting phlegm after coughing, and generalized body aches.  He has been taking some Coricidin after talking to his doctor.  He has not had a fever that he knows of.  He does not really feel short of breath.  Has taken some Tylenol, last took yesterday.  Past Medical History:  Diagnosis Date   Arthritis    neck, knees; seen Dr. Rhona Raider, Norco 3-4 per day, 14 yrs, stable   Coronary artery disease    Hyperlipidemia    Hypertension    Insomnia    OCD (obsessive compulsive disorder)    with anxiety   Stuttering    uses Diazepam   Tobacco abuse 1999, quit    Patient Active Problem List   Diagnosis Date Noted   Kidney stones 12/08/2020   Osteoarthritis 08/05/2020   BPH with urinary obstruction 09/01/2019   B12 deficiency 05/02/2017   Chronic ischemic heart disease, unspecified 04/21/2013   Coronary atherosclerosis of native coronary artery 04/21/2013   Anxiety state 05/25/2008   Hypothyroidism 04/30/2007   Hyperlipidemia 04/30/2007   HYPERTENSION, BENIGN 04/30/2007   GERD 04/30/2007   DEGENERATIVE JOINT DISEASE, 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/2007   LOW BACK PAIN 01/24/2007    Past Surgical History:  Procedure Laterality Date   [place]  03/19/11   stent X 2, in [place]  10/12   VASECTOMY  1983   WISDOM TOOTH EXTRACTION         Family History  Problem Relation Age of Onset   Arthritis Father    Hypertension Father    Prostate cancer Father    Heart attack Father    Arthritis Sister    Breast cancer Sister     Social History   Tobacco Use    Smoking status: Former   Smokeless tobacco: Never  Scientific laboratory technician Use: Never used  Substance Use Topics   Alcohol use: No    Alcohol/week: 0.0 standard drinks    Home Medications Prior to Admission medications   Medication Sig Start Date End Date Taking? Authorizing Provider  benzonatate (TESSALON) 100 MG capsule Take 1 capsule (100 mg total) by mouth 3 (three) times daily as needed for cough. 05/09/21  Yes Sherwood Gambler, MD  Acetaminophen (TYLENOL EXTRA STRENGTH PO) Take 500 mg by mouth daily as needed (pain).     [provider]  aspirin 81 MG tablet Take 81 mg by mouth daily.    [provider]  atorvastatin (LIPITOR) 80 MG tablet TAKE 1 TABLET (80 MG TOTAL) BY MOUTH DAILY. *LABS REQUIRED FOR FUTURE REFILLS OF MEDICINE. 04/24/21   Laurey Morale, MD  diazepam (VALIUM) 5 MG tablet TAKE 1 TABLET BY MOUTH THREE TIMES DAILY AS NEEDED FOR ANXIETY AND  SPEECH 12/06/20   Laurey Morale, MD  lisinopril (ZESTRIL) 40 MG tablet Take 1 tablet (40 mg total) by mouth daily. 04/03/21   Jettie Booze, MD  metoprolol succinate (TOPROL-XL) 50 MG 24 hr tablet TAKE 1 TABLET BY MOUTH DAILY. TAKE WITH OR IMMEDIATELY FOLLOWING  A MEAL. 04/24/21   Laurey Morale, MD  Multiple Vitamin (MULTIVITAMIN) tablet Take 1 tablet by mouth daily.    [provider]  nitroGLYCERIN (NITROSTAT) 0.4 MG SL tablet Place 1 tablet (0.4 mg total) under the tongue every 5 (five) minutes as needed for chest pain. 03/01/20   Jettie Booze, MD  Omega-3 Fatty Acids (FISH OIL) 1000 MG CAPS Take 1,000 mg by mouth daily.    [provider]  Vitamin D, Ergocalciferol, (DRISDOL) 1.25 MG (50000 UNIT) CAPS capsule Take 50,000 Units by mouth every 7 (seven) days.    [provider]    Allergies    Penicillins and Doxazosin  Review of Systems   Review of Systems  Constitutional:  Negative for fever.  HENT:  Positive for congestion and sore throat.   Respiratory:  Positive for  cough. Negative for shortness of breath.   Gastrointestinal:  Positive for abdominal pain (feels sore with coughing) and vomiting (post-tussive).  Musculoskeletal:  Positive for myalgias.  Neurological:  Positive for headaches.  All other systems reviewed and are negative.  Physical Exam Updated Vital Signs BP (!) 182/87    Pulse 77    Temp 98.9 F (37.2 C) (Oral)    Resp 18    Ht 5\' 9"  (1.753 m)    Wt 78.9 kg    SpO2 100%    BMI 25.70 kg/m   Physical Exam Vitals and nursing note reviewed.  Constitutional:      General: He is not in acute distress.    Appearance: He is well-developed. He is not ill-appearing or diaphoretic.  HENT:     Head: Normocephalic and atraumatic.     Right Ear: External ear normal.     Left Ear: External ear normal.     Nose: Nose normal.     Mouth/Throat:     Tonsils: No tonsillar exudate or tonsillar abscesses.  Eyes:     General:        Right eye: No discharge.        Left eye: No discharge.  Cardiovascular:     Rate and Rhythm: Normal rate and regular rhythm.     Heart sounds: Normal heart sounds.  Pulmonary:     Effort: Pulmonary effort is normal.     Breath sounds: Normal breath sounds. No wheezing.  Abdominal:     Palpations: Abdomen is soft.     Tenderness: There is no abdominal tenderness.  Musculoskeletal:     Cervical back: Neck supple.  Skin:    General: Skin is warm and dry.  Neurological:     Mental Status: He is alert.  Psychiatric:        Mood and Affect: Mood is not anxious.    ED Results / Procedures / Treatments   Labs (all labs ordered are listed, but only abnormal results are displayed) Labs Reviewed  RESP PANEL BY RT-PCR (FLU A&B, COVID) ARPGX2 - Abnormal; Notable for the following components:      Result Value   Influenza A by PCR POSITIVE (*)    All other components within normal limits    EKG None  Radiology DG Chest 2 View  Result Date: 05/09/2021 CLINICAL DATA:  Cough, congestion EXAM: CHEST - 2 VIEW  COMPARISON:  05/14/2018 FINDINGS: The heart size and mediastinal contours are within normal limits. Both lungs are clear. Disc degenerative disease of the thoracic spine. IMPRESSION: No acute abnormality of the lungs. Electronically Signed   By: Delanna Ahmadi  M.D.   On: 05/09/2021 10:05    Procedures Procedures   Medications Ordered in ED Medications  acetaminophen (TYLENOL) tablet 1,000 mg (1,000 mg Oral Given 05/09/21 8338)    ED Course  I have reviewed the triage vital signs and the nursing notes.  Pertinent labs & imaging results that were available during my care of the patient were reviewed by me and considered in my medical decision making (see chart for details).    MDM Rules/Calculators/A&P                         Patient's flu test is positive which is likely explaining all of his symptoms.  He is well-appearing.  He does have hypertension but he states he has been battling poorly controlled hypertension for months.  I do not think he needs emergent treatment of his hypertension.  He is not really short of breath and his oxygen is normal.  Chest x-ray is clear.  He declines Tamiflu which I think is reasonable and we will give antitussives and discussed supportive care.  Follow-up with PCP.    Final Clinical Impression(s) / ED Diagnoses Final diagnoses:  Influenza A    Rx / DC Orders ED Discharge Orders          Ordered    benzonatate (TESSALON) 100 MG capsule  3 times daily PRN        05/09/21 1118             Sherwood Gambler, MD 05/09/21 1454

## 2021-05-09 NOTE — ED Triage Notes (Signed)
Patient c/o sore throat, nasal congestion and headache onset of Sunday afternoon. Spoke with PCP yesterday and was told to get OTC cough medicine.

## 2021-05-17 ENCOUNTER — Ambulatory Visit: Payer: Medicare HMO

## 2021-05-19 NOTE — Telephone Encounter (Signed)
Spoke with pt advised to schedule a PMV per Dr Sarajane Jews, pt state that he doesn't think he needs one but needs to see Dr Sarajane Jews for other issues, pt scheduled for office visit on1/09/2021

## 2021-05-25 ENCOUNTER — Ambulatory Visit (INDEPENDENT_AMBULATORY_CARE_PROVIDER_SITE_OTHER): Payer: Medicare HMO | Admitting: Family Medicine

## 2021-05-25 ENCOUNTER — Encounter: Payer: Self-pay | Admitting: Family Medicine

## 2021-05-25 VITALS — BP 164/94 | HR 71 | Temp 98.2°F | Wt 175.0 lb

## 2021-05-25 DIAGNOSIS — J3089 Other allergic rhinitis: Secondary | ICD-10-CM | POA: Diagnosis not present

## 2021-05-25 DIAGNOSIS — R0981 Nasal congestion: Secondary | ICD-10-CM | POA: Diagnosis not present

## 2021-05-25 DIAGNOSIS — I10 Essential (primary) hypertension: Secondary | ICD-10-CM | POA: Diagnosis not present

## 2021-05-25 DIAGNOSIS — J101 Influenza due to other identified influenza virus with other respiratory manifestations: Secondary | ICD-10-CM | POA: Diagnosis not present

## 2021-05-25 LAB — POC COVID19 BINAXNOW: SARS Coronavirus 2 Ag: NEGATIVE

## 2021-05-25 MED ORDER — METHYLPREDNISOLONE ACETATE 40 MG/ML IJ SUSP
40.0000 mg | Freq: Once | INTRAMUSCULAR | Status: AC
  Administered 2021-05-25: 40 mg via INTRAMUSCULAR

## 2021-05-25 MED ORDER — METHYLPREDNISOLONE ACETATE 80 MG/ML IJ SUSP
80.0000 mg | Freq: Once | INTRAMUSCULAR | Status: AC
  Administered 2021-05-25: 80 mg via INTRAMUSCULAR

## 2021-05-25 NOTE — Addendum Note (Signed)
Addended by: Wyvonne Lenz on: 05/25/2021 12:47 PM   Modules accepted: Orders

## 2021-05-25 NOTE — Progress Notes (Signed)
° °  Subjective:    Patient ID: Anthony Benton., male    DOB: 04/27/1951, 71 y.o.   MRN: [id number]  HPI Here to follow up an ED visit on 05-09-21, to discuss allergy issues, and to discuss his BP. At the ED visit he presented with fever, body aches, nausea, and a dry cough. His exam was unremarkable. His CXR was clear. He tested positive for influenza A and negative for Covid-19. He was offered Tamiflu but he declined it. Over the next week most of his symptoms resolved but he does complain ot sinus pressure, stuffy nose, and PND. No cough or SOB or fever. He says his apartment is very dusty, and his landlord refuses to clean out the HVAC vents. He has been taking Coricidin and Tylenol with no relief. Also his BP has been quite high for several months, often in the range of 150-180 over 80s to 90s. He saw Cardiology a few weeks ago and they increased his Lisinopril from 20 mg to 40 mg daily. He also takes Metoprolol succinate 50 mg daily. This has not affected the BP very much.    Review of Systems  Constitutional: Negative.   HENT:  Positive for congestion, postnasal drip, sinus pressure and sneezing. Negative for sore throat.   Eyes: Negative.   Respiratory: Negative.    Cardiovascular: Negative.   Neurological: Negative.       Objective:   Physical Exam Constitutional:      Appearance: Normal appearance.  HENT:     Right Ear: Tympanic membrane, ear canal and external ear normal.     Left Ear: Tympanic membrane, ear canal and external ear normal.     Nose: Nose normal.     Mouth/Throat:     Pharynx: Oropharynx is clear.  Eyes:     Conjunctiva/sclera: Conjunctivae normal.  Cardiovascular:     Rate and Rhythm: Normal rate and regular rhythm.     Pulses: Normal pulses.     Heart sounds: Normal heart sounds.  Pulmonary:     Effort: Pulmonary effort is normal.     Breath sounds: Normal breath sounds.  Lymphadenopathy:     Cervical: No cervical adenopathy.  Neurological:      Mental Status: He is alert and oriented to person, place, and time. Mental status is at baseline.          Assessment & Plan:  He recently had an influenza infection, and this seems to have resolved. He has a lot of allergy issues now so we will give him a shot of DepoMedrol. His HTN is poorly controlled, so we will increase the Metoprolol succinate to 100 mg daily. He will follow up with Cardiology in a few weeks. We spent a total of ( 32  ) minutes reviewing records and discussing these issues.  Alysia Penna, MD

## 2021-05-26 DIAGNOSIS — D2262 Melanocytic nevi of left upper limb, including shoulder: Secondary | ICD-10-CM | POA: Diagnosis not present

## 2021-05-26 DIAGNOSIS — L814 Other melanin hyperpigmentation: Secondary | ICD-10-CM | POA: Diagnosis not present

## 2021-05-26 DIAGNOSIS — D485 Neoplasm of uncertain behavior of skin: Secondary | ICD-10-CM | POA: Diagnosis not present

## 2021-05-26 DIAGNOSIS — D225 Melanocytic nevi of trunk: Secondary | ICD-10-CM | POA: Diagnosis not present

## 2021-05-26 DIAGNOSIS — B081 Molluscum contagiosum: Secondary | ICD-10-CM | POA: Diagnosis not present

## 2021-05-26 DIAGNOSIS — Z85828 Personal history of other malignant neoplasm of skin: Secondary | ICD-10-CM | POA: Diagnosis not present

## 2021-05-26 DIAGNOSIS — D0361 Melanoma in situ of right upper limb, including shoulder: Secondary | ICD-10-CM | POA: Diagnosis not present

## 2021-05-26 DIAGNOSIS — L820 Inflamed seborrheic keratosis: Secondary | ICD-10-CM | POA: Diagnosis not present

## 2021-05-26 DIAGNOSIS — L853 Xerosis cutis: Secondary | ICD-10-CM | POA: Diagnosis not present

## 2021-05-26 DIAGNOSIS — L821 Other seborrheic keratosis: Secondary | ICD-10-CM | POA: Diagnosis not present

## 2021-05-26 DIAGNOSIS — D224 Melanocytic nevi of scalp and neck: Secondary | ICD-10-CM | POA: Diagnosis not present

## 2021-05-30 ENCOUNTER — Telehealth: Payer: Self-pay | Admitting: Pharmacist

## 2021-05-30 MED ORDER — AMLODIPINE BESYLATE 2.5 MG PO TABS: ORAL_TABLET | ORAL | 3 refills | Status: DC

## 2021-05-30 NOTE — Telephone Encounter (Signed)
Patient called stating he went to see his PCP today. BP was high. PCP said he could increase metoprolol to 100mg . Patient called to see if this was ok. I advised that it would be ok, but would not be as effective as adding amlodipine as I had recommended last visit. Patient agreeable to adding amlodipine 2.5mg  daily. F/u in clinic 1/17.

## 2021-06-06 ENCOUNTER — Ambulatory Visit: Payer: Medicare HMO | Admitting: Pharmacist

## 2021-06-06 ENCOUNTER — Other Ambulatory Visit: Payer: Self-pay

## 2021-06-06 VITALS — BP 130/78 | HR 67

## 2021-06-06 DIAGNOSIS — I10 Essential (primary) hypertension: Secondary | ICD-10-CM | POA: Diagnosis not present

## 2021-06-06 NOTE — Progress Notes (Signed)
Patient ID: Anthony Benton.                 DOB: 05-12-1951                    MRN: [id number]     HPI: Anthony Benton. is a 71 y.o. male patient referred to PharmD clinic by Dr. Irish Lack. PMH is significant for CAD status post DES to the RCA times 06/2010, HTN and HLD. He saw Dr. Irish Lack on 11/14. Lisinopril was increased to 40mg  daily. Repeat BMP stable.  At last visit with PharmD, patient was very resistant to any changes. He seems to be having a very hard time with the separation from his wife and he reports a lot of family stress. He appears lonely, but doesn't like to go in public due to fear of germs. No changes were made to medications at his last visit as patient refused. I had several phone conversations with patient after his visit. He eventually agreed to start amlodipine 2.5mg  daily on 1/10.  Patient presents to clinic today. He is happy with the improvements in his blood pressure after starting amlodipine. He as been on it for 7 days. Be brings in his home blood pressure cuff. He says he has been checking with his right arm.   140/79 Home BP cuff 149/84 HR 65 Home Bp cuff 140/80 clinic reading  138/81 home BP cuff 130/78 clinic reading   Current Lipid Medications: atorvastatin 80mg  daily Current HTN Medications: metoprolol succinate 50mg  daily, lisinopril 40mg  daily, amlodipine 2.5mg  daily Risk Factors: HTN, >65, ASCVD LDL goal: <55  Diet: did not get to discuss  Exercise: none  Family History: The patient's family history includes Arthritis in his father and sister; Breast cancer in his sister; Heart attack in his father; Hypertension in his father; Prostate cancer in his father.   Social History: The patient  reports that he has quit smoking. He has never used smokeless tobacco. He reports that he does not drink alcohol.     Labs: 04/10/21 TC 179, TG 182, HDL 40, LDL-C 107 Blood Pressures: 164/78, 126/77, 133/82, 142/69, 138/71, 127/70, 128/62 HR  59-78  Past Medical History:  Diagnosis Date   Arthritis    neck, knees; seen Dr. Rhona Raider, Norco 3-4 per day, 14 yrs, stable   Coronary artery disease    Hyperlipidemia    Hypertension    Insomnia    OCD (obsessive compulsive disorder)    with anxiety   Stuttering    uses Diazepam   Tobacco abuse 1999, quit    Current Outpatient Medications on File Prior to Visit  Medication Sig Dispense Refill   Acetaminophen (TYLENOL EXTRA STRENGTH PO) Take 500 mg by mouth daily as needed (pain).      amLODipine (NORVASC) 2.5 MG tablet Take one tablet by mouth daily for high blood pressure 90 tablet 3   aspirin 81 MG tablet Take 81 mg by mouth daily.     atorvastatin (LIPITOR) 80 MG tablet TAKE 1 TABLET (80 MG TOTAL) BY MOUTH DAILY. *LABS REQUIRED FOR FUTURE REFILLS OF MEDICINE. 90 tablet 0   diazepam (VALIUM) 5 MG tablet TAKE 1 TABLET BY MOUTH THREE TIMES DAILY AS NEEDED FOR ANXIETY AND  SPEECH 90 tablet 5   lisinopril (ZESTRIL) 40 MG tablet Take 1 tablet (40 mg total) by mouth daily. 90 tablet 3   metoprolol succinate (TOPROL-XL) 50 MG 24 hr tablet TAKE 1 TABLET BY MOUTH DAILY. TAKE WITH  OR IMMEDIATELY FOLLOWING A MEAL. 90 tablet 1   Multiple Vitamin (MULTIVITAMIN) tablet Take 1 tablet by mouth daily.     nitroGLYCERIN (NITROSTAT) 0.4 MG SL tablet Place 1 tablet (0.4 mg total) under the tongue every 5 (five) minutes as needed for chest pain. 25 tablet 3   Omega-3 Fatty Acids (FISH OIL) 1000 MG CAPS Take 1,000 mg by mouth daily.     Vitamin D, Ergocalciferol, (DRISDOL) 1.25 MG (50000 UNIT) CAPS capsule Take 50,000 Units by mouth every 7 (seven) days.     No current facility-administered medications on file prior to visit.    Allergies  Allergen Reactions   Penicillins Anaphylaxis    DID THE REACTION INVOLVE: Swelling of the face/tongue/throat, SOB, or low BP? Anaphylaxis Sudden or severe rash/hives, skin peeling, or the inside of the mouth or nose? Y Did it require medical treatment?  Y When did it last happen?  Over 40 years ago. If all above answers are NO, may proceed with cephalosporin use.    Doxazosin     Nightmares     Assessment/Plan:  1. Hyperlipidemia - LDL is above goal of <55. At last visit patient was very resistant to adding an additional medication. Will discuss again at next visit.  2.HTN- BP is significantly improved. He has only been on amlodipine for 7 days. Continue metoprolol succinate 50mg  daily, lisinopril 40mg  daily and amlodipine 2.5mg  daily. Continue checking blood pressure at home. Home cuff found to be accurate. Follow up in clinic in 1 month.  Thank you,   Ramond Dial, Pharm.D, BCPS, CPP [facility]  [address], [place], [place] [address]  Phone: [phone]; Fax: [phone]

## 2021-06-06 NOTE — Patient Instructions (Addendum)
Please continue metoprolol succinate 50mg  daily, lisinopril 40mg  daily and amlodipine 2.5mg  daily  Continue checking blood pressure at home

## 2021-06-12 DIAGNOSIS — D0361 Melanoma in situ of right upper limb, including shoulder: Secondary | ICD-10-CM | POA: Diagnosis not present

## 2021-06-28 DIAGNOSIS — Z87442 Personal history of urinary calculi: Secondary | ICD-10-CM | POA: Diagnosis not present

## 2021-06-28 DIAGNOSIS — R972 Elevated prostate specific antigen [PSA]: Secondary | ICD-10-CM | POA: Diagnosis not present

## 2021-06-28 DIAGNOSIS — R351 Nocturia: Secondary | ICD-10-CM | POA: Diagnosis not present

## 2021-06-28 DIAGNOSIS — N401 Enlarged prostate with lower urinary tract symptoms: Secondary | ICD-10-CM | POA: Diagnosis not present

## 2021-06-29 ENCOUNTER — Telehealth: Payer: Self-pay | Admitting: Family Medicine

## 2021-06-29 NOTE — Telephone Encounter (Signed)
Spoke with patient.  Patient forgot to take B12 for the last weeks.  Informed patient to restart taking again, with history of B12 deficiency.

## 2021-06-29 NOTE — Telephone Encounter (Signed)
Patient called in asking if he should continue to take the vitamin D and B12 tablets he's taking.  Patient is requesting a phone call back from [phone].  Please advise.

## 2021-07-11 ENCOUNTER — Telehealth: Payer: Self-pay

## 2021-07-11 ENCOUNTER — Ambulatory Visit: Payer: Medicare HMO

## 2021-07-11 NOTE — Telephone Encounter (Signed)
Lmom to r/s

## 2021-07-12 ENCOUNTER — Telehealth: Payer: Self-pay

## 2021-07-12 ENCOUNTER — Ambulatory Visit: Payer: Medicare HMO

## 2021-07-12 NOTE — Telephone Encounter (Signed)
Lmom to r/s

## 2021-07-12 NOTE — Telephone Encounter (Signed)
Called and spoke w/pt and they stated that they  would call back to r/s appt with maccia rph.

## 2021-07-19 ENCOUNTER — Other Ambulatory Visit: Payer: Self-pay | Admitting: Family Medicine

## 2021-07-19 NOTE — Telephone Encounter (Signed)
Pt LOV was on 05/25/2021 ?Last refill done on 12/06/2020 ?Please advise ?

## 2021-08-10 ENCOUNTER — Telehealth: Payer: Self-pay

## 2021-08-10 NOTE — Telephone Encounter (Signed)
Called and lmom pt that Anthony Benton stated: ? can you let him know that I did get his VM. Im very happy that his blood pressure is doing well. He does not need to see me in clinic unless he is any issues or his blood pressure increases again ?

## 2021-08-21 ENCOUNTER — Other Ambulatory Visit: Payer: Self-pay | Admitting: Family Medicine

## 2021-08-29 ENCOUNTER — Telehealth (INDEPENDENT_AMBULATORY_CARE_PROVIDER_SITE_OTHER): Payer: Medicare HMO | Admitting: Family Medicine

## 2021-08-29 ENCOUNTER — Encounter: Payer: Self-pay | Admitting: Family Medicine

## 2021-08-29 VITALS — Temp 97.5°F

## 2021-08-29 DIAGNOSIS — N200 Calculus of kidney: Secondary | ICD-10-CM | POA: Diagnosis not present

## 2021-08-29 DIAGNOSIS — R059 Cough, unspecified: Secondary | ICD-10-CM | POA: Diagnosis not present

## 2021-08-29 DIAGNOSIS — R52 Pain, unspecified: Secondary | ICD-10-CM

## 2021-08-29 DIAGNOSIS — U071 COVID-19: Secondary | ICD-10-CM

## 2021-08-29 LAB — POCT INFLUENZA A/B
Influenza A, POC: NEGATIVE
Influenza B, POC: NEGATIVE

## 2021-08-29 LAB — POC COVID19 BINAXNOW: SARS Coronavirus 2 Ag: POSITIVE — AB

## 2021-08-29 MED ORDER — HYDROCODONE-ACETAMINOPHEN 5-325 MG PO TABS: 1.0000 | ORAL_TABLET | Freq: Four times a day (QID) | ORAL | 0 refills | Status: AC | PRN

## 2021-08-29 MED ORDER — HYDROCODONE BIT-HOMATROP MBR 5-1.5 MG/5ML PO SOLN: 5.0000 mL | ORAL | 0 refills | Status: DC | PRN

## 2021-08-29 NOTE — Progress Notes (Signed)
? ?Subjective:  ? ? Patient ID: Anthony Benton., male    DOB: Apr 11, 1951, 71 y.o.   MRN: [id number] ? ?HPI ?Virtual Visit via Video Note ? ?I connected with the patient on 08/29/21 at  2:30 PM EDT by a video enabled telemedicine application and verified that I am speaking with the correct person using two identifiers. ? Location patient: home ?Location provider:work or home office ?Persons participating in the virtual visit: patient, provider ? ?I discussed the limitations of evaluation and management by telemedicine and the availability of in person appointments. The patient expressed understanding and agreed to proceed. ? ? ?HPI: ?Here for 5 days of a ST, body aches, fever, and a dry cough. No NVD. No chest pain or SOB. He tested positive for the Covid-19 virus earlier today. He says he has been passing numerous small kidney stones this week,and he asks for a pain medication to help with this. He is drinking lots of water.  ? ? ?ROS: See pertinent positives and negatives per HPI. ? ?Past Medical History:  ?Diagnosis Date  ? Arthritis   ? neck, knees; seen Dr. Rhona Raider, Norco 3-4 per day, 14 yrs, stable  ? Coronary artery disease   ? Hyperlipidemia   ? Hypertension   ? Insomnia   ? OCD (obsessive compulsive disorder)   ? with anxiety  ? Stuttering   ? uses Diazepam  ? Tobacco abuse 1999, quit  ? ? ?Past Surgical History:  ?Procedure Laterality Date  ? [place]  ? CARDIAC CATHETERIZATION  03/19/11  ? stent X 2, in [place]  ? CORONARY ANGIOPLASTY  10/12  ? VASECTOMY  1983  ? WISDOM TOOTH EXTRACTION    ? ? ?Family History  ?Problem Relation Age of Onset  ? Arthritis Father   ? Hypertension Father   ? Prostate cancer Father   ? Heart attack Father   ? Arthritis Sister   ? Breast cancer Sister   ? ? ? ?Current Outpatient Medications:  ?  Acetaminophen (TYLENOL EXTRA STRENGTH PO), Take 500 mg by mouth daily as needed (pain). , Disp: , Rfl:  ?  amLODipine (NORVASC) 2.5 MG tablet, Take one tablet by  mouth daily for high blood pressure, Disp: 90 tablet, Rfl: 3 ?  aspirin 81 MG tablet, Take 81 mg by mouth daily., Disp: , Rfl:  ?  atorvastatin (LIPITOR) 80 MG tablet, TAKE 1 TABLET (80 MG TOTAL) BY MOUTH DAILY. *LABS REQUIRED FOR FUTURE REFILLS OF MEDICINE., Disp: 90 tablet, Rfl: 0 ?  diazepam (VALIUM) 5 MG tablet, TAKE 1 TABLET BY MOUTH THREE TIMES A DAY AS NEEDED FOR ANXIETY AND SPEECH, Disp: 90 tablet, Rfl: 5 ?  lisinopril (ZESTRIL) 40 MG tablet, Take 1 tablet (40 mg total) by mouth daily., Disp: 90 tablet, Rfl: 3 ?  metoprolol succinate (TOPROL-XL) 50 MG 24 hr tablet, TAKE 1 TABLET BY MOUTH DAILY. TAKE WITH OR IMMEDIATELY FOLLOWING A MEAL., Disp: 90 tablet, Rfl: 1 ?  Multiple Vitamin (MULTIVITAMIN) tablet, Take 1 tablet by mouth daily., Disp: , Rfl:  ?  Omega-3 Fatty Acids (FISH OIL) 1000 MG CAPS, Take 1,000 mg by mouth daily., Disp: , Rfl:  ?  Vitamin D, Ergocalciferol, (DRISDOL) 1.25 MG (50000 UNIT) CAPS capsule, Take 50,000 Units by mouth every 7 (seven) days., Disp: , Rfl:  ? ?EXAM: ? ?VITALS per patient if applicable: ? ?GENERAL: alert, oriented, appears well and in no acute distress ? ?HEENT: atraumatic, conjunttiva clear, no obvious abnormalities on inspection of  external nose and ears ? ?NECK: normal movements of the head and neck ? ?LUNGS: on inspection no signs of respiratory distress, breathing rate appears normal, no obvious gross SOB, gasping or wheezing ? ?CV: no obvious cyanosis ? ?MS: moves all visible extremities without noticeable abnormality ? ?PSYCH/NEURO: pleasant and cooperative, no obvious depression or anxiety, speech and thought processing grossly intact ? ?ASSESSMENT AND PLAN: ?He has a Covid-19 infection. He is past the 5 day window, some will not be able to prescribe him an antiviral medication. He will rest and drink fluids. He can use Hycodan syrup as needed for the cough. He is also passing kidney stones, so we will send him a small supply of Norco to use as needed. He will follow  up with Urology in a few weeks.  ?Alysia Penna, MD ? ?Discussed the following assessment and plan: ? ?Cough, unspecified type - Plan: POC COVID-19, POC Influenza A/B ? ?Generalized body aches - Plan: POC COVID-19, POC Influenza A/B ? ? ?  ?I discussed the assessment and treatment plan with the patient. The patient was provided an opportunity to ask questions and all were answered. The patient agreed with the plan and demonstrated an understanding of the instructions. ?  ?The patient was advised to call back or seek an in-person evaluation if the symptoms worsen or if the condition fails to improve as anticipated. ? ?  ? ? ?Review of Systems ? ?   ?Objective:  ? Physical Exam ? ? ? ? ?   ?Assessment & Plan:  ? ? ?

## 2021-09-05 ENCOUNTER — Ambulatory Visit (INDEPENDENT_AMBULATORY_CARE_PROVIDER_SITE_OTHER): Payer: Medicare HMO

## 2021-09-05 ENCOUNTER — Ambulatory Visit (INDEPENDENT_AMBULATORY_CARE_PROVIDER_SITE_OTHER): Payer: Medicare HMO | Admitting: Family Medicine

## 2021-09-05 ENCOUNTER — Encounter: Payer: Self-pay | Admitting: Family Medicine

## 2021-09-05 VITALS — BP 128/76 | HR 73 | Temp 98.4°F | Wt 172.1 lb

## 2021-09-05 DIAGNOSIS — J40 Bronchitis, not specified as acute or chronic: Secondary | ICD-10-CM

## 2021-09-05 DIAGNOSIS — E785 Hyperlipidemia, unspecified: Secondary | ICD-10-CM

## 2021-09-05 DIAGNOSIS — R059 Cough, unspecified: Secondary | ICD-10-CM | POA: Diagnosis not present

## 2021-09-05 DIAGNOSIS — R739 Hyperglycemia, unspecified: Secondary | ICD-10-CM | POA: Diagnosis not present

## 2021-09-05 LAB — CBC WITH DIFFERENTIAL/PLATELET
Basophils Absolute: 0 10*3/uL (ref 0.0–0.1)
Basophils Relative: 0.1 % (ref 0.0–3.0)
Eosinophils Absolute: 0.1 10*3/uL (ref 0.0–0.7)
Eosinophils Relative: 0.8 % (ref 0.0–5.0)
HCT: 40.1 % (ref 39.0–52.0)
Hemoglobin: 13.9 g/dL (ref 13.0–17.0)
Lymphocytes Relative: 22.4 % (ref 12.0–46.0)
Lymphs Abs: 1.8 10*3/uL (ref 0.7–4.0)
MCHC: 34.5 g/dL (ref 30.0–36.0)
MCV: 92.6 fl (ref 78.0–100.0)
Monocytes Absolute: 0.7 10*3/uL (ref 0.1–1.0)
Monocytes Relative: 8.5 % (ref 3.0–12.0)
Neutro Abs: 5.6 10*3/uL (ref 1.4–7.7)
Neutrophils Relative %: 68.2 % (ref 43.0–77.0)
Platelets: 305 10*3/uL (ref 150.0–400.0)
RBC: 4.33 Mil/uL (ref 4.22–5.81)
RDW: 13.6 % (ref 11.5–15.5)
WBC: 8.2 10*3/uL (ref 4.0–10.5)

## 2021-09-05 LAB — LIPID PANEL
Cholesterol: 176 mg/dL (ref 0–200)
HDL: 38.7 mg/dL — ABNORMAL LOW (ref >39.00)
NonHDL: 137.3
Total CHOL/HDL Ratio: 5
Triglycerides: 245 mg/dL — ABNORMAL HIGH (ref 0.0–149.0)
VLDL: 49 mg/dL — ABNORMAL HIGH (ref 0.0–40.0)

## 2021-09-05 LAB — HEMOGLOBIN A1C: Hgb A1c MFr Bld: 6.3 % (ref 4.6–6.5)

## 2021-09-05 LAB — BASIC METABOLIC PANEL
BUN: 8 mg/dL (ref 6–23)
CO2: 28 mEq/L (ref 19–32)
Calcium: 10 mg/dL (ref 8.4–10.5)
Chloride: 102 mEq/L (ref 96–112)
Creatinine, Ser: 0.86 mg/dL (ref 0.40–1.50)
GFR: 87.1 mL/min (ref >60.00)
Glucose, Bld: 96 mg/dL (ref 70–99)
Potassium: 4 mEq/L (ref 3.5–5.1)
Sodium: 138 mEq/L (ref 135–145)

## 2021-09-05 LAB — LDL CHOLESTEROL, DIRECT: Direct LDL: 99 mg/dL

## 2021-09-05 LAB — HEPATIC FUNCTION PANEL
ALT: 22 U/L (ref 0–53)
AST: 21 U/L (ref 0–37)
Albumin: 4.7 g/dL (ref 3.5–5.2)
Alkaline Phosphatase: 79 U/L (ref 39–117)
Bilirubin, Direct: 0.1 mg/dL (ref 0.0–0.3)
Total Bilirubin: 0.8 mg/dL (ref 0.2–1.2)
Total Protein: 7.9 g/dL (ref 6.0–8.3)

## 2021-09-05 LAB — TSH: TSH: 1.16 u[IU]/mL (ref 0.35–5.50)

## 2021-09-05 MED ORDER — DOXYCYCLINE HYCLATE 100 MG PO CAPS: 100.0000 mg | ORAL_CAPSULE | Freq: Two times a day (BID) | ORAL | 0 refills | Status: AC

## 2021-09-05 NOTE — Progress Notes (Signed)
? ?  Subjective:  ? ? Patient ID: Anthony Benton., male    DOB: Feb 12, 1951, 71 y.o.   MRN: [id number] ? ?HPI ?Here to follow up on a Covid-19 infection. We had a virtual visit with him on 08-29-21 and gave him cough syrup. He was also passing some kidney stones so we gave him a small supply of Norco. Since then he has stopped passing stones. However he continues to cough up some yellow sputum after these 3 weeks. No fever or SOB.  ? ? ?Review of Systems  ?Constitutional: Negative.   ?HENT:  Positive for congestion and postnasal drip. Negative for sore throat.   ?Eyes: Negative.   ?Respiratory:  Positive for cough and choking. Negative for shortness of breath.   ?Cardiovascular: Negative.   ? ?   ?Objective:  ? Physical Exam ?Constitutional:   ?   Appearance: Normal appearance. He is not ill-appearing.  ?HENT:  ?   Right Ear: Tympanic membrane, ear canal and external ear normal.  ?   Left Ear: Tympanic membrane, ear canal and external ear normal.  ?   Nose: Nose normal.  ?   Mouth/Throat:  ?   Pharynx: Oropharynx is clear.  ?Eyes:  ?   Conjunctiva/sclera: Conjunctivae normal.  ?Pulmonary:  ?   Effort: Pulmonary effort is normal.  ?   Breath sounds: Rhonchi present. No wheezing or rales.  ?Lymphadenopathy:  ?   Cervical: No cervical adenopathy.  ?Neurological:  ?   Mental Status: He is alert.  ? ? ? ? ? ?   ?Assessment & Plan:  ?Post-Covid bronchitis. Treat with 10 days of Doxycycline. Get labs and a CXR today. ?Alysia Penna, MD ? ? ?

## 2021-09-08 ENCOUNTER — Telehealth: Payer: Self-pay | Admitting: Family Medicine

## 2021-09-08 NOTE — Telephone Encounter (Addendum)
Pt is calling and would like blood work results and xray result .Pt is aware md out of office until tuesday ?

## 2021-09-08 NOTE — Telephone Encounter (Signed)
Waiting for Dr. Sarajane Jews result message for patient.  ?

## 2021-09-13 NOTE — Telephone Encounter (Signed)
Pt would like nancy to go over his blood work again and also mail him a copy to home address. Pt has ? ?

## 2021-09-14 ENCOUNTER — Telehealth: Payer: Self-pay

## 2021-09-14 NOTE — Telephone Encounter (Signed)
Reviewed lab / imaging results with pt verbalized understanding ?

## 2021-09-14 NOTE — Telephone Encounter (Signed)
Reviewed lab results with pt, picked up a copy from the office and the letter requested for his rental company ?

## 2021-09-15 ENCOUNTER — Telehealth: Payer: Self-pay | Admitting: Pharmacist

## 2021-09-15 NOTE — Telephone Encounter (Signed)
Patient called asking about his HDL. Advised that its just a little low, but other than increasing his exercise there isn't anything beneficial to raise it. ?

## 2021-09-18 IMAGING — MR MR PROSTATE WO/W CM
56 series · 56 of 56 positions shown · IV contrast (16ml Multihance)
Comparison: None.
COMPARISON: None.
COMPARISON: None.

Addendum:
CLINICAL DATA: Elevated PSA.  No biopsy.

EXAM:
MR PROSTATE WITHOUT AND WITH CONTRAST
TECHNIQUE: Multiplanar multisequence MRI images were obtained of the pelvis
centered about the prostate. Pre and post contrast images were
obtained.
CONTRAST:  16mL MULTIHANCE GADOBENATE DIMEGLUMINE 529 MG/ML IV SOLN

[Series 3: bSSFP fat-sat · axial · 8.0mm · 0.74mm/px · 1 of 28 slices shown]
[im 1/28]
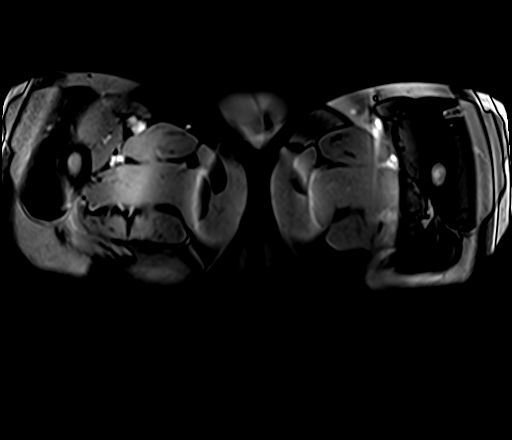

[Series 4: T1 · axial · 5.0mm · 1.25mm/px · 1 of 80 slices shown]
[im 1/80]
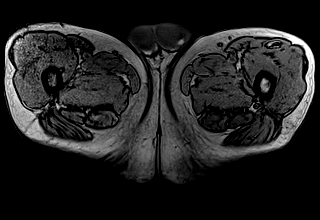

[Series 5: T2 · coronal · 3.5mm · 0.56mm/px · 1 of 23 slices shown (1 of 3)]
[im 1/23]
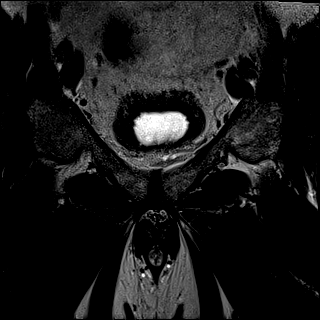

[Series 6: DWI · axial · 3.5mm · 1.75mm/px · 1 of 81 slices shown (1 of 3)]
[im 1/81]
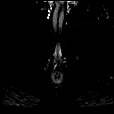

[Series 7: DWI · axial · 3.5mm · 1.75mm/px · 1 of 27 slices shown (2 of 3)]
[im 1/27]
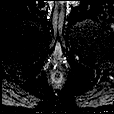

[Series 8: DWI · axial · 3.5mm · 1.56mm/px · 1 of 26 slices shown (3 of 3)]
[im 1/26]
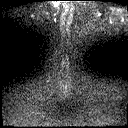

[Series 9: T2 · axial · 3.5mm · 0.56mm/px · 1 of 26 slices shown (2 of 3)]
[im 1/26]
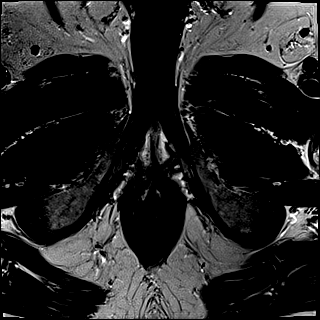

[Series 10: T2 · axial · 1.0mm · 1.04mm/px · 1 of 80 slices shown (3 of 3)]
[im 1/80]
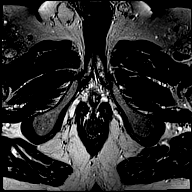

[Series 11: pre t1_twist_tra_dyn_ttc=6.4s · axial · non-contrast · 3.5mm · 0.83mm/px · 1 of 24 slices shown]
[im 1/24]
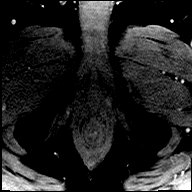

[Series 12: post t1_twist_tra_dyn-copy center · axial · 3.5mm · 0.83mm/px · 1 of 24 slices shown (1 of 24)]
[im 1/24]
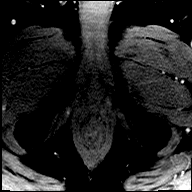

[Series 13: post t1_twist_tra_dyn-copy center · axial · 3.5mm · 0.83mm/px · 1 of 24 slices shown (2 of 24)]
[im 1/24]
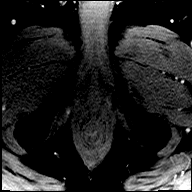

[Series 14: post t1_twist_tra_dyn-copy cent_sub_ttc=(id) · axial · 3.5mm · 0.83mm/px · 1 of 17 slices shown (1 of 23)]
[im 1/17]
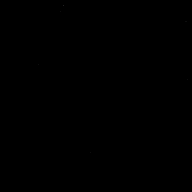

[Series 15: post t1_twist_tra_dyn-copy center · axial · 3.5mm · 0.83mm/px · 1 of 24 slices shown (3 of 24)]
[im 1/24]
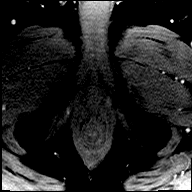

[Series 16: post t1_twist_tra_dyn-copy cent_sub_ttc=(id) · axial · 3.5mm · 0.83mm/px · 1 of 17 slices shown (2 of 23)]
[im 1/17]
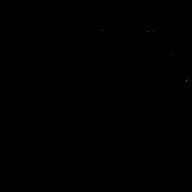

[Series 17: post t1_twist_tra_dyn-copy center · axial · 3.5mm · 0.83mm/px · 1 of 24 slices shown (4 of 24)]
[im 1/24]
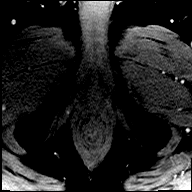

[Series 18: post t1_twist_tra_dyn-copy cent_sub_ttc=(id) · axial · 3.5mm · 0.83mm/px · 1 of 22 slices shown (3 of 23)]
[im 1/22]
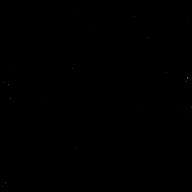

[Series 19: post t1_twist_tra_dyn-copy center · axial · 3.5mm · 0.83mm/px · 1 of 24 slices shown (5 of 24)]
[im 1/24]
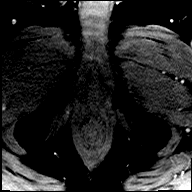

[Series 20: post t1_twist_tra_dyn-copy cent_sub_ttc=(id) · axial · 3.5mm · 0.83mm/px · 1 of 24 slices shown (4 of 23)]
[im 1/24]
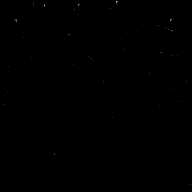

[Series 21: post t1_twist_tra_dyn-copy center · axial · 3.5mm · 0.83mm/px · 1 of 24 slices shown (6 of 24)]
[im 1/24]
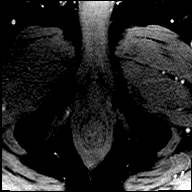

[Series 22: post t1_twist_tra_dyn-copy cent_sub_ttc=(id) · axial · 3.5mm · 0.83mm/px · 1 of 23 slices shown (5 of 23)]
[im 1/23]
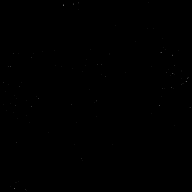

[Series 23: post t1_twist_tra_dyn-copy center · axial · 3.5mm · 0.83mm/px · 1 of 24 slices shown (7 of 24)]
[im 1/24]
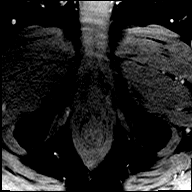

[Series 24: post t1_twist_tra_dyn-copy cent_sub_ttc=(id) · axial · 3.5mm · 0.83mm/px · 1 of 24 slices shown (6 of 23)]
[im 1/24]
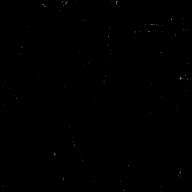

[Series 25: post t1_twist_tra_dyn-copy center · axial · 3.5mm · 0.83mm/px · 1 of 24 slices shown (8 of 24)]
[im 1/24]
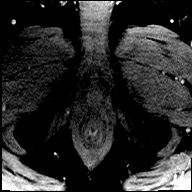

[Series 26: post t1_twist_tra_dyn-copy cent_sub_ttc=(id) · axial · 3.5mm · 0.83mm/px · 1 of 24 slices shown (7 of 23)]
[im 1/24]
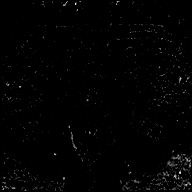

[Series 27: post t1_twist_tra_dyn-copy center · axial · 3.5mm · 0.83mm/px · 1 of 24 slices shown (9 of 24)]
[im 1/24]
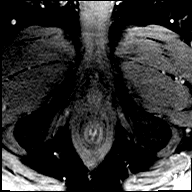

[Series 28: post t1_twist_tra_dyn-copy cent_sub_ttc=(id) · axial · 3.5mm · 0.83mm/px · 1 of 24 slices shown (8 of 23)]
[im 1/24]
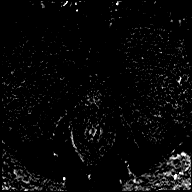

[Series 29: post t1_twist_tra_dyn-copy center · axial · 3.5mm · 0.83mm/px · 1 of 24 slices shown (10 of 24)]
[im 1/24]
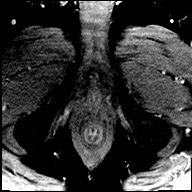

[Series 30: post t1_twist_tra_dyn-copy cent_sub_ttc=(id) · axial · 3.5mm · 0.83mm/px · 1 of 24 slices shown (9 of 23)]
[im 1/24]
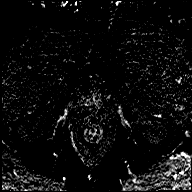

[Series 31: post t1_twist_tra_dyn-copy center · axial · 3.5mm · 0.83mm/px · 1 of 24 slices shown (11 of 24)]
[im 1/24]
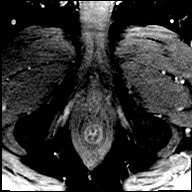

[Series 32: post t1_twist_tra_dyn-copy cent_sub_ttc=(id) · axial · 3.5mm · 0.83mm/px · 1 of 24 slices shown (10 of 23)]
[im 1/24]
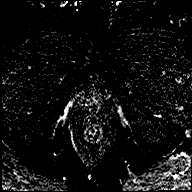

[Series 33: post t1_twist_tra_dyn-copy center · axial · 3.5mm · 0.83mm/px · 1 of 24 slices shown (12 of 24)]
[im 1/24]
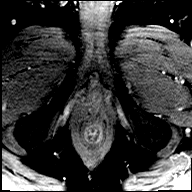

[Series 34: post t1_twist_tra_dyn-copy cent_sub_ttc=(id) · axial · 3.5mm · 0.83mm/px · 1 of 24 slices shown (11 of 23)]
[im 1/24]
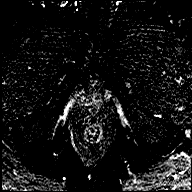

[Series 35: post t1_twist_tra_dyn-copy center · axial · 3.5mm · 0.83mm/px · 1 of 24 slices shown (13 of 24)]
[im 1/24]
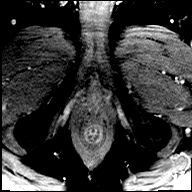

[Series 36: post t1_twist_tra_dyn-copy cent_sub_ttc=(id) · axial · 3.5mm · 0.83mm/px · 1 of 24 slices shown (12 of 23)]
[im 1/24]
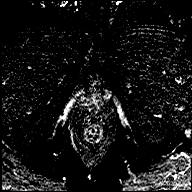

[Series 37: post t1_twist_tra_dyn-copy center · axial · 3.5mm · 0.83mm/px · 1 of 24 slices shown (14 of 24)]
[im 1/24]
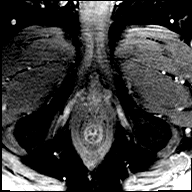

[Series 38: post t1_twist_tra_dyn-copy cent_sub_ttc=(id) · axial · 3.5mm · 0.83mm/px · 1 of 24 slices shown (13 of 23)]
[im 1/24]
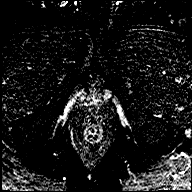

[Series 39: post t1_twist_tra_dyn-copy center · axial · 3.5mm · 0.83mm/px · 1 of 24 slices shown (15 of 24)]
[im 1/24]
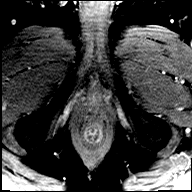

[Series 40: post t1_twist_tra_dyn-copy cent_sub_ttc=(id) · axial · 3.5mm · 0.83mm/px · 1 of 24 slices shown (14 of 23)]
[im 1/24]
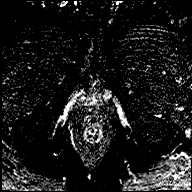

[Series 41: post t1_twist_tra_dyn-copy center · axial · 3.5mm · 0.83mm/px · 1 of 24 slices shown (16 of 24)]
[im 1/24]
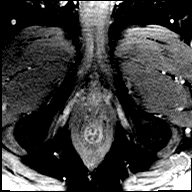

[Series 42: post t1_twist_tra_dyn-copy cent_sub_ttc=(id) · axial · 3.5mm · 0.83mm/px · 1 of 24 slices shown (15 of 23)]
[im 1/24]
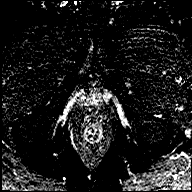

[Series 43: post t1_twist_tra_dyn-copy center · axial · 3.5mm · 0.83mm/px · 1 of 24 slices shown (17 of 24)]
[im 1/24]
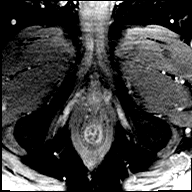

[Series 44: post t1_twist_tra_dyn-copy cent_sub_ttc=(id) · axial · 3.5mm · 0.83mm/px · 1 of 24 slices shown (16 of 23)]
[im 1/24]
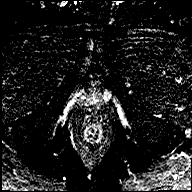

[Series 45: post t1_twist_tra_dyn-copy center · axial · 3.5mm · 0.83mm/px · 1 of 24 slices shown (18 of 24)]
[im 1/24]
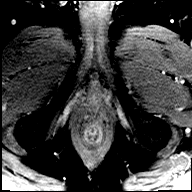

[Series 46: post t1_twist_tra_dyn-copy cent_sub_ttc=(id) · axial · 3.5mm · 0.83mm/px · 1 of 24 slices shown (17 of 23)]
[im 1/24]
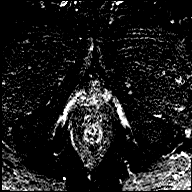

[Series 47: post t1_twist_tra_dyn-copy center · axial · 3.5mm · 0.83mm/px · 1 of 24 slices shown (19 of 24)]
[im 1/24]
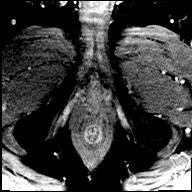

[Series 48: post t1_twist_tra_dyn-copy cent_sub_ttc=(id) · axial · 3.5mm · 0.83mm/px · 1 of 24 slices shown (18 of 23)]
[im 1/24]
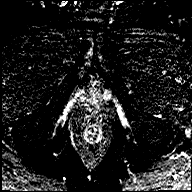

[Series 49: post t1_twist_tra_dyn-copy center · axial · 3.5mm · 0.83mm/px · 1 of 24 slices shown (20 of 24)]
[im 1/24]
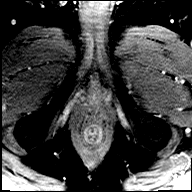

[Series 50: post t1_twist_tra_dyn-copy cent_sub_ttc=(id) · axial · 3.5mm · 0.83mm/px · 1 of 24 slices shown (19 of 23)]
[im 1/24]
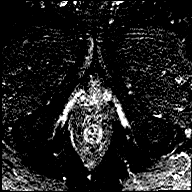

[Series 51: post t1_twist_tra_dyn-copy center · axial · 3.5mm · 0.83mm/px · 1 of 24 slices shown (21 of 24)]
[im 1/24]
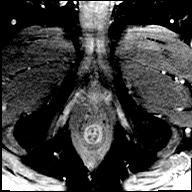

[Series 52: post t1_twist_tra_dyn-copy cent_sub_ttc=(id) · axial · 3.5mm · 0.83mm/px · 1 of 24 slices shown (20 of 23)]
[im 1/24]
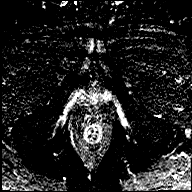

[Series 53: post t1_twist_tra_dyn-copy center · axial · 3.5mm · 0.83mm/px · 1 of 24 slices shown (22 of 24)]
[im 1/24]
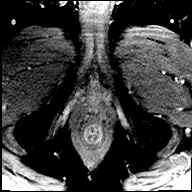

[Series 54: post t1_twist_tra_dyn-copy cent_sub_ttc=(id) · axial · 3.5mm · 0.83mm/px · 1 of 24 slices shown (21 of 23)]
[im 1/24]
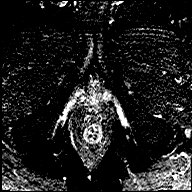

[Series 55: post t1_twist_tra_dyn-copy center · axial · 3.5mm · 0.83mm/px · 1 of 24 slices shown (23 of 24)]
[im 1/24]
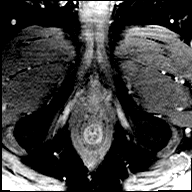

[Series 56: post t1_twist_tra_dyn-copy cent_sub_ttc=(id) · axial · 3.5mm · 0.83mm/px · 1 of 24 slices shown (22 of 23)]
[im 1/24]
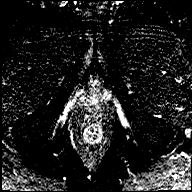

[Series 57: post t1_twist_tra_dyn-copy center · axial · 3.5mm · 0.83mm/px · 1 of 24 slices shown (24 of 24)]
[im 1/24]
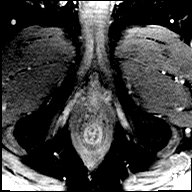

[Series 58: post t1_twist_tra_dyn-copy cent_sub_ttc=(id) · axial · 3.5mm · 0.83mm/px · 1 of 24 slices shown (23 of 23)]
[im 1/24]
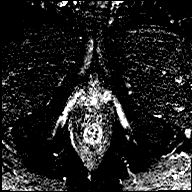

[56 of 56 positions shown; findings below may reference images not displayed]

FINDINGS: Prostate: The peripheral zone is markedly thinned by the enlarged
transitional zone. The thin transitional zone has normal high signal
intensity on T2 weighted imaging without focal lesion. No foci of
restricted diffusion within the peripheral zone.

The transitional zone is enlarged by multiple capsulated nodules
which have no suspicious imaging features on T2 weighted imaging

Prostatic capsule is intact.  Seminal vesicles are normal.

Volume: 5.7 x 4.7 x 6.0 cm (volume = 84 cm^3)

Transcapsular spread:  Absent

Seminal vesicle involvement: Absent

Neurovascular bundle involvement: Absent

Pelvic adenopathy: Small external and internal iliac lymph nodes
measuring 3 mm and 5 mm respectively on image 10/series 4.

Bone metastasis: Absent

Other findings: None
IMPRESSION: 1. No high-grade carcinoma in peripheral zone.  PI-RADS: 1.
2. Enlarged nodular transitional zone consistent benign prostate
hypertrophy. PI-RADS: 2
3. Prostatomegaly.
4. Two small RIGHT iliac lymph nodes are 5 mm or less.

:
The correct referring physician is Dr. Radhika Vilorio.

*** End of Addendum ***
Addendum:
FINDINGS: Prostate: The peripheral zone is markedly thinned by the enlarged
transitional zone. The thin transitional zone has normal high signal
intensity on T2 weighted imaging without focal lesion. No foci of
restricted diffusion within the peripheral zone.

The transitional zone is enlarged by multiple capsulated nodules
which have no suspicious imaging features on T2 weighted imaging

Prostatic capsule is intact.  Seminal vesicles are normal.

Volume: 5.7 x 4.7 x 6.0 cm (volume = 84 cm^3)

Transcapsular spread:  Absent

Seminal vesicle involvement: Absent

Neurovascular bundle involvement: Absent

Pelvic adenopathy: Small external and internal iliac lymph nodes
measuring 3 mm and 5 mm respectively on image 10/series 4.

Bone metastasis: Absent

Other findings: None
IMPRESSION: 1. No high-grade carcinoma in peripheral zone.  PI-RADS: 1.
2. Enlarged nodular transitional zone consistent benign prostate
hypertrophy. PI-RADS: 2
3. Prostatomegaly.
4. Two small RIGHT iliac lymph nodes are 5 mm or less.

*** End of Addendum ***
FINDINGS: Prostate: The peripheral zone is markedly thinned by the enlarged
transitional zone. The thin transitional zone has normal high signal
intensity on T2 weighted imaging without focal lesion. No foci of
restricted diffusion within the peripheral zone.

The transitional zone is enlarged by multiple capsulated nodules
which have no suspicious imaging features on T2 weighted imaging

Prostatic capsule is intact.  Seminal vesicles are normal.

Volume: 5.7 x 4.7 x 6.0 cm (volume = 84 cm^3)

Transcapsular spread:  Absent

Seminal vesicle involvement: Absent

Neurovascular bundle involvement: Absent

Pelvic adenopathy: Small external and internal iliac lymph nodes
measuring 3 mm and 5 mm respectively on image 10/series 4.

Bone metastasis: Absent

Other findings: None
IMPRESSION: 1. No high-grade carcinoma in peripheral zone.  PI-RADS: 1.
2. Enlarged nodular transitional zone consistent benign prostate
hypertrophy. PI-RADS: 2
3. Prostatomegaly.
4. Two small RIGHT iliac lymph nodes are 5 mm or less.

## 2021-09-27 DIAGNOSIS — R310 Gross hematuria: Secondary | ICD-10-CM | POA: Diagnosis not present

## 2021-10-02 DIAGNOSIS — N209 Urinary calculus, unspecified: Secondary | ICD-10-CM | POA: Diagnosis not present

## 2021-10-02 DIAGNOSIS — E212 Other hyperparathyroidism: Secondary | ICD-10-CM | POA: Diagnosis not present

## 2021-10-02 DIAGNOSIS — R310 Gross hematuria: Secondary | ICD-10-CM | POA: Diagnosis not present

## 2021-10-04 DIAGNOSIS — N200 Calculus of kidney: Secondary | ICD-10-CM | POA: Diagnosis not present

## 2021-10-04 DIAGNOSIS — N400 Enlarged prostate without lower urinary tract symptoms: Secondary | ICD-10-CM | POA: Diagnosis not present

## 2021-10-04 DIAGNOSIS — I700 Atherosclerosis of aorta: Secondary | ICD-10-CM | POA: Diagnosis not present

## 2021-10-04 DIAGNOSIS — R310 Gross hematuria: Secondary | ICD-10-CM | POA: Diagnosis not present

## 2021-10-04 DIAGNOSIS — N281 Cyst of kidney, acquired: Secondary | ICD-10-CM | POA: Diagnosis not present

## 2021-10-25 DIAGNOSIS — M5451 Vertebrogenic low back pain: Secondary | ICD-10-CM | POA: Diagnosis not present

## 2021-11-04 ENCOUNTER — Other Ambulatory Visit: Payer: Self-pay | Admitting: Family Medicine

## 2021-12-20 DIAGNOSIS — D225 Melanocytic nevi of trunk: Secondary | ICD-10-CM | POA: Diagnosis not present

## 2021-12-20 DIAGNOSIS — Z85820 Personal history of malignant melanoma of skin: Secondary | ICD-10-CM | POA: Diagnosis not present

## 2021-12-20 DIAGNOSIS — L570 Actinic keratosis: Secondary | ICD-10-CM | POA: Diagnosis not present

## 2021-12-20 DIAGNOSIS — D485 Neoplasm of uncertain behavior of skin: Secondary | ICD-10-CM | POA: Diagnosis not present

## 2021-12-20 DIAGNOSIS — Z85828 Personal history of other malignant neoplasm of skin: Secondary | ICD-10-CM | POA: Diagnosis not present

## 2021-12-20 DIAGNOSIS — L853 Xerosis cutis: Secondary | ICD-10-CM | POA: Diagnosis not present

## 2022-01-01 ENCOUNTER — Encounter: Payer: Self-pay | Admitting: Family Medicine

## 2022-01-01 ENCOUNTER — Ambulatory Visit (INDEPENDENT_AMBULATORY_CARE_PROVIDER_SITE_OTHER): Payer: Medicare HMO | Admitting: Family Medicine

## 2022-01-01 VITALS — BP 140/76 | HR 76 | Temp 98.0°F | Wt 174.0 lb

## 2022-01-01 DIAGNOSIS — R69 Illness, unspecified: Secondary | ICD-10-CM | POA: Diagnosis not present

## 2022-01-01 DIAGNOSIS — I10 Essential (primary) hypertension: Secondary | ICD-10-CM

## 2022-01-01 DIAGNOSIS — E559 Vitamin D deficiency, unspecified: Secondary | ICD-10-CM

## 2022-01-01 DIAGNOSIS — F411 Generalized anxiety disorder: Secondary | ICD-10-CM

## 2022-01-01 DIAGNOSIS — E538 Deficiency of other specified B group vitamins: Secondary | ICD-10-CM | POA: Diagnosis not present

## 2022-01-01 NOTE — Progress Notes (Signed)
   Subjective:    Patient ID: Anthony Benton., male    DOB: April 21, 1951, 71 y.o.   MRN: [id number]  HPI Here to discuss his anxiety. He has felt more anxious than usual this past week and he thinks it is because he has some upcoming procedures. He is scheduled to have a steroid injection to the left shoulder per Dr. Novella Olive later this week, and he will have a skin lesion excised from his neck by his Dermatologist next week. He is worried that something could "go wrong" during these procedures. He takes Valium occasionally for the anxiety. He also asks that we get lab work today to check his "vitamin levels" and other other items. He feels tired all the time and he thinks he is "low on something".    Review of Systems  Constitutional:  Positive for fatigue.  Respiratory: Negative.    Cardiovascular: Negative.   Gastrointestinal: Negative.   Genitourinary: Negative.   Musculoskeletal:  Positive for arthralgias.  Neurological: Negative.   Psychiatric/Behavioral:  Positive for decreased concentration and sleep disturbance. Negative for agitation, confusion, dysphoric mood and hallucinations. The patient is nervous/anxious.        Objective:   Physical Exam Constitutional:      General: He is not in acute distress. Cardiovascular:     Rate and Rhythm: Normal rate and regular rhythm.     Pulses: Normal pulses.     Heart sounds: Normal heart sounds.  Pulmonary:     Effort: Pulmonary effort is normal.     Breath sounds: Normal breath sounds.  Neurological:     General: No focal deficit present.     Mental Status: He is alert and oriented to person, place, and time.  Psychiatric:        Behavior: Behavior normal.        Thought Content: Thought content normal.     Comments: He is very anxious            Assessment & Plan:  He has chronic anxiety, and he is worried about these upcoming procedures. I encouraged him to take the Valium as needed, and I reminded him, that he can  take this up to 3 times a day if needed. For the fatigue, we will get labs today. We spent a total of ( 32  ) minutes reviewing records and discussing these issues.  Alysia Penna, MD

## 2022-01-02 ENCOUNTER — Other Ambulatory Visit (INDEPENDENT_AMBULATORY_CARE_PROVIDER_SITE_OTHER): Payer: Medicare HMO

## 2022-01-02 DIAGNOSIS — E559 Vitamin D deficiency, unspecified: Secondary | ICD-10-CM | POA: Diagnosis not present

## 2022-01-02 DIAGNOSIS — I10 Essential (primary) hypertension: Secondary | ICD-10-CM | POA: Diagnosis not present

## 2022-01-02 DIAGNOSIS — E538 Deficiency of other specified B group vitamins: Secondary | ICD-10-CM

## 2022-01-02 LAB — BASIC METABOLIC PANEL
BUN: 9 mg/dL (ref 6–23)
CO2: 24 mEq/L (ref 19–32)
Calcium: 9.5 mg/dL (ref 8.4–10.5)
Chloride: 104 mEq/L (ref 96–112)
Creatinine, Ser: 0.83 mg/dL (ref 0.40–1.50)
GFR: 87.83 mL/min (ref >60.00)
Glucose, Bld: 161 mg/dL — ABNORMAL HIGH (ref 70–99)
Potassium: 3.5 mEq/L (ref 3.5–5.1)
Sodium: 136 mEq/L (ref 135–145)

## 2022-01-02 LAB — VITAMIN B12: Vitamin B-12: 307 pg/mL (ref 211–911)

## 2022-01-02 LAB — HEPATIC FUNCTION PANEL
ALT: 19 U/L (ref 0–53)
AST: 17 U/L (ref 0–37)
Albumin: 4.1 g/dL (ref 3.5–5.2)
Alkaline Phosphatase: 66 U/L (ref 39–117)
Bilirubin, Direct: 0.1 mg/dL (ref 0.0–0.3)
Total Bilirubin: 0.6 mg/dL (ref 0.2–1.2)
Total Protein: 6.7 g/dL (ref 6.0–8.3)

## 2022-01-02 LAB — CBC WITH DIFFERENTIAL/PLATELET
Basophils Absolute: 0 10*3/uL (ref 0.0–0.1)
Basophils Relative: 0.5 % (ref 0.0–3.0)
Eosinophils Absolute: 0.1 10*3/uL (ref 0.0–0.7)
Eosinophils Relative: 1.1 % (ref 0.0–5.0)
HCT: 40.6 % (ref 39.0–52.0)
Hemoglobin: 13.6 g/dL (ref 13.0–17.0)
Lymphocytes Relative: 25.5 % (ref 12.0–46.0)
Lymphs Abs: 1.7 10*3/uL (ref 0.7–4.0)
MCHC: 33.3 g/dL (ref 30.0–36.0)
MCV: 93.8 fl (ref 78.0–100.0)
Monocytes Absolute: 0.5 10*3/uL (ref 0.1–1.0)
Monocytes Relative: 7.7 % (ref 3.0–12.0)
Neutro Abs: 4.4 10*3/uL (ref 1.4–7.7)
Neutrophils Relative %: 65.2 % (ref 43.0–77.0)
Platelets: 198 10*3/uL (ref 150.0–400.0)
RBC: 4.33 Mil/uL (ref 4.22–5.81)
RDW: 13.7 % (ref 11.5–15.5)
WBC: 6.8 10*3/uL (ref 4.0–10.5)

## 2022-01-02 LAB — TSH: TSH: 0.88 u[IU]/mL (ref 0.35–5.50)

## 2022-01-02 LAB — VITAMIN D 25 HYDROXY (VIT D DEFICIENCY, FRACTURES): VITD: 27.72 ng/mL — ABNORMAL LOW (ref 30.00–100.00)

## 2022-01-03 ENCOUNTER — Telehealth: Payer: Self-pay | Admitting: Family Medicine

## 2022-01-03 DIAGNOSIS — M25512 Pain in left shoulder: Secondary | ICD-10-CM | POA: Diagnosis not present

## 2022-01-03 MED ORDER — VITAMIN D (ERGOCALCIFEROL) 1.25 MG (50000 UNIT) PO CAPS: 50000.0000 [IU] | ORAL_CAPSULE | ORAL | 3 refills | Status: DC

## 2022-01-03 NOTE — Telephone Encounter (Signed)
Pt requesting muscle relaxants for his neck and back. Was seen earlier this week. States his back is tight and he is walking hunched over.  [id number]:  [phone] Fax:  [phone]

## 2022-01-03 NOTE — Telephone Encounter (Signed)
Pharmacy updated.

## 2022-01-04 MED ORDER — CYCLOBENZAPRINE HCL 10 MG PO TABS: 10.0000 mg | ORAL_TABLET | Freq: Three times a day (TID) | ORAL | 2 refills | Status: DC | PRN

## 2022-01-04 NOTE — Telephone Encounter (Signed)
Done

## 2022-01-04 NOTE — Addendum Note (Signed)
Addended by: Alysia Penna A on: 01/04/2022 10:39 AM   Modules accepted: Orders

## 2022-01-08 DIAGNOSIS — L988 Other specified disorders of the skin and subcutaneous tissue: Secondary | ICD-10-CM | POA: Diagnosis not present

## 2022-01-08 DIAGNOSIS — D485 Neoplasm of uncertain behavior of skin: Secondary | ICD-10-CM | POA: Diagnosis not present

## 2022-02-01 ENCOUNTER — Other Ambulatory Visit: Payer: Self-pay | Admitting: Family Medicine

## 2022-02-01 ENCOUNTER — Other Ambulatory Visit: Payer: Self-pay | Admitting: Interventional Cardiology

## 2022-02-01 NOTE — Telephone Encounter (Signed)
Last OV- 01/01/22 Last refill-07/19/2021-90 tabs, 5 refills  No future OV scheduled.

## 2022-02-12 ENCOUNTER — Ambulatory Visit (INDEPENDENT_AMBULATORY_CARE_PROVIDER_SITE_OTHER): Payer: Medicare HMO | Admitting: Family Medicine

## 2022-02-12 ENCOUNTER — Encounter: Payer: Self-pay | Admitting: Family Medicine

## 2022-02-12 ENCOUNTER — Other Ambulatory Visit: Payer: Self-pay | Admitting: Interventional Cardiology

## 2022-02-12 VITALS — BP 138/80 | HR 85 | Temp 98.9°F | Wt 171.2 lb

## 2022-02-12 DIAGNOSIS — R509 Fever, unspecified: Secondary | ICD-10-CM

## 2022-02-12 DIAGNOSIS — J40 Bronchitis, not specified as acute or chronic: Secondary | ICD-10-CM

## 2022-02-12 DIAGNOSIS — R0981 Nasal congestion: Secondary | ICD-10-CM

## 2022-02-12 LAB — POC COVID19 BINAXNOW: SARS Coronavirus 2 Ag: NEGATIVE

## 2022-02-12 LAB — POCT RAPID STREP A (OFFICE): Rapid Strep A Screen: NEGATIVE

## 2022-02-12 LAB — POCT INFLUENZA A/B
Influenza A, POC: NEGATIVE
Influenza B, POC: NEGATIVE

## 2022-02-12 MED ORDER — DOXYCYCLINE HYCLATE 100 MG PO CAPS: 100.0000 mg | ORAL_CAPSULE | Freq: Two times a day (BID) | ORAL | 0 refills | Status: AC

## 2022-02-12 MED ORDER — METHYLPREDNISOLONE ACETATE 80 MG/ML IJ SUSP
80.0000 mg | Freq: Once | INTRAMUSCULAR | Status: AC
  Administered 2022-02-12: 80 mg via INTRAMUSCULAR

## 2022-02-12 MED ORDER — METHYLPREDNISOLONE ACETATE 40 MG/ML IJ SUSP
40.0000 mg | Freq: Once | INTRAMUSCULAR | Status: AC
  Administered 2022-02-12: 40 mg via INTRAMUSCULAR

## 2022-02-12 NOTE — Progress Notes (Signed)
   Subjective:    Patient ID: Anthony Benton., male    DOB: 1950-07-27, 71 y.o.   MRN: [id number]  HPI Here for 4 days of stuffy head, PND, chest congestion, wheezing, and coughing up yellow sputum. No chest pain or SOB or fever. Using Nyquil.    Review of Systems  Constitutional: Negative.   HENT:  Positive for congestion, postnasal drip, sinus pressure and sore throat. Negative for ear pain.   Eyes: Negative.   Respiratory:  Positive for cough, chest tightness and wheezing. Negative for shortness of breath.        Objective:   Physical Exam Constitutional:      Comments: Coughing occasionally   HENT:     Right Ear: Tympanic membrane, ear canal and external ear normal.     Left Ear: Tympanic membrane, ear canal and external ear normal.     Nose: Nose normal.     Mouth/Throat:     Pharynx: Oropharynx is clear.  Eyes:     Conjunctiva/sclera: Conjunctivae normal.  Cardiovascular:     Rate and Rhythm: Normal rate and regular rhythm.     Pulses: Normal pulses.     Heart sounds: Normal heart sounds.  Pulmonary:     Effort: Pulmonary effort is normal.     Breath sounds: Wheezing present. No rhonchi or rales.  Neurological:     Mental Status: He is alert.           Assessment & Plan:  Bronchitis, treat with 10 days of Doxycycline and a shot of DepoMedrol.  Alysia Penna, MD

## 2022-02-22 ENCOUNTER — Other Ambulatory Visit: Payer: Self-pay | Admitting: Family Medicine

## 2022-02-22 NOTE — Telephone Encounter (Signed)
Patient requesting labs done for his lipitor usage, feels he needs his cholesterol check. Pls call patient to let him know

## 2022-02-27 ENCOUNTER — Encounter: Payer: Self-pay | Admitting: Family Medicine

## 2022-02-27 ENCOUNTER — Ambulatory Visit (INDEPENDENT_AMBULATORY_CARE_PROVIDER_SITE_OTHER): Payer: Medicare HMO

## 2022-02-27 ENCOUNTER — Ambulatory Visit (INDEPENDENT_AMBULATORY_CARE_PROVIDER_SITE_OTHER): Payer: Medicare HMO | Admitting: Family Medicine

## 2022-02-27 VITALS — BP 126/74 | HR 75 | Temp 98.1°F | Wt 169.0 lb

## 2022-02-27 DIAGNOSIS — R059 Cough, unspecified: Secondary | ICD-10-CM | POA: Diagnosis not present

## 2022-02-27 DIAGNOSIS — J189 Pneumonia, unspecified organism: Secondary | ICD-10-CM | POA: Diagnosis not present

## 2022-02-27 MED ORDER — AZITHROMYCIN 500 MG PO TABS: 500.0000 mg | ORAL_TABLET | Freq: Every day | ORAL | 0 refills | Status: DC

## 2022-02-27 NOTE — Progress Notes (Signed)
   Subjective:    Patient ID: Anthony Benton., male    DOB: 1950-06-03, 71 y.o.   MRN: [id number]  HPI Here for continuing cough and SOB. We saw him on 02-12-22 for this, and his lungs were clear on exam except for some wheezes. We gave him 10 days of Doxycycline and a DepoMedrol shot, but these have not helped at all. He still has the cough, and now it produces yellow sputum. He has night sweats with no measurable fever.    Review of Systems  Constitutional:  Positive for diaphoresis.  Respiratory:  Positive for cough, shortness of breath and wheezing.   Cardiovascular: Negative.        Objective:   Physical Exam Constitutional:      Appearance: Normal appearance.     Comments: He coughs occasionally   Cardiovascular:     Rate and Rhythm: Normal rate and regular rhythm.     Pulses: Normal pulses.     Heart sounds: Normal heart sounds.  Pulmonary:     Effort: Pulmonary effort is normal.     Breath sounds: Rales present. No wheezing or rhonchi.     Comments: There are rales in the left posterior base  Neurological:     Mental Status: He is alert.           Assessment & Plan:  LLL pneumonia. Get a CXR today. He will take 10 days of Azithromycin 500 mg daily.  Alysia Penna, MD

## 2022-03-02 ENCOUNTER — Telehealth: Payer: Self-pay | Admitting: Family Medicine

## 2022-03-02 NOTE — Telephone Encounter (Signed)
Spoke with patient about xray results.

## 2022-03-02 NOTE — Telephone Encounter (Signed)
Pt called to ask for a call back to go over his xray results.  [phone]  Pt reports still feeling unwell, especially at night.

## 2022-03-06 ENCOUNTER — Telehealth: Payer: Self-pay | Admitting: Family Medicine

## 2022-03-06 NOTE — Telephone Encounter (Signed)
Finish out the Zpack, and yes please take a shower daily

## 2022-03-06 NOTE — Telephone Encounter (Signed)
Pt called to say he has 3 pills left on the z pack and although he is feeling slightly better, he has been experiencing dizziness, diarrhea, and night sweats. Says he has not taken a bath in over a week and is afraid that if he does, he might get worse. Pt is asking if MD thinks it would be ok for him to take a shower? Please advise.

## 2022-03-07 NOTE — Telephone Encounter (Signed)
Spoke with pt scheduled for an office visit with Dr Sarajane Jews on 03/09/22

## 2022-03-09 ENCOUNTER — Ambulatory Visit: Payer: Medicare HMO | Admitting: Family Medicine

## 2022-03-12 ENCOUNTER — Encounter: Payer: Self-pay | Admitting: Family Medicine

## 2022-03-12 ENCOUNTER — Ambulatory Visit (INDEPENDENT_AMBULATORY_CARE_PROVIDER_SITE_OTHER): Payer: Medicare HMO | Admitting: Family Medicine

## 2022-03-12 VITALS — BP 138/80 | HR 73 | Temp 98.4°F | Wt 170.0 lb

## 2022-03-12 DIAGNOSIS — E538 Deficiency of other specified B group vitamins: Secondary | ICD-10-CM | POA: Diagnosis not present

## 2022-03-12 DIAGNOSIS — I10 Essential (primary) hypertension: Secondary | ICD-10-CM | POA: Diagnosis not present

## 2022-03-12 DIAGNOSIS — G8929 Other chronic pain: Secondary | ICD-10-CM

## 2022-03-12 DIAGNOSIS — R739 Hyperglycemia, unspecified: Secondary | ICD-10-CM

## 2022-03-12 DIAGNOSIS — J189 Pneumonia, unspecified organism: Secondary | ICD-10-CM | POA: Diagnosis not present

## 2022-03-12 DIAGNOSIS — E559 Vitamin D deficiency, unspecified: Secondary | ICD-10-CM | POA: Diagnosis not present

## 2022-03-12 DIAGNOSIS — M545 Low back pain, unspecified: Secondary | ICD-10-CM | POA: Diagnosis not present

## 2022-03-12 LAB — CBC WITH DIFFERENTIAL/PLATELET
Basophils Absolute: 0 10*3/uL (ref 0.0–0.1)
Basophils Relative: 0.2 % (ref 0.0–3.0)
Eosinophils Absolute: 0 10*3/uL (ref 0.0–0.7)
Eosinophils Relative: 0.5 % (ref 0.0–5.0)
HCT: 41.4 % (ref 39.0–52.0)
Hemoglobin: 13.9 g/dL (ref 13.0–17.0)
Lymphocytes Relative: 21.7 % (ref 12.0–46.0)
Lymphs Abs: 1.9 10*3/uL (ref 0.7–4.0)
MCHC: 33.6 g/dL (ref 30.0–36.0)
MCV: 93.6 fl (ref 78.0–100.0)
Monocytes Absolute: 0.6 10*3/uL (ref 0.1–1.0)
Monocytes Relative: 7.3 % (ref 3.0–12.0)
Neutro Abs: 6.1 10*3/uL (ref 1.4–7.7)
Neutrophils Relative %: 70.3 % (ref 43.0–77.0)
Platelets: 193 10*3/uL (ref 150.0–400.0)
RBC: 4.42 Mil/uL (ref 4.22–5.81)
RDW: 13 % (ref 11.5–15.5)
WBC: 8.6 10*3/uL (ref 4.0–10.5)

## 2022-03-12 LAB — BASIC METABOLIC PANEL
BUN: 12 mg/dL (ref 6–23)
CO2: 26 mEq/L (ref 19–32)
Calcium: 10.2 mg/dL (ref 8.4–10.5)
Chloride: 104 mEq/L (ref 96–112)
Creatinine, Ser: 0.83 mg/dL (ref 0.40–1.50)
GFR: 87.72 mL/min (ref >60.00)
Glucose, Bld: 100 mg/dL — ABNORMAL HIGH (ref 70–99)
Potassium: 3.8 mEq/L (ref 3.5–5.1)
Sodium: 139 mEq/L (ref 135–145)

## 2022-03-12 LAB — POC URINALSYSI DIPSTICK (AUTOMATED)
Bilirubin, UA: NEGATIVE
Blood, UA: NEGATIVE
Glucose, UA: NEGATIVE
Ketones, UA: NEGATIVE
Leukocytes, UA: NEGATIVE
Nitrite, UA: NEGATIVE
Protein, UA: NEGATIVE
Spec Grav, UA: 1.01 (ref 1.010–1.025)
Urobilinogen, UA: 0.2 E.U./dL
pH, UA: 6 (ref 5.0–8.0)

## 2022-03-12 LAB — LIPID PANEL
Cholesterol: 172 mg/dL (ref 0–200)
HDL: 54.9 mg/dL (ref >39.00)
LDL Cholesterol: 83 mg/dL (ref 0–99)
NonHDL: 116.91
Total CHOL/HDL Ratio: 3
Triglycerides: 169 mg/dL — ABNORMAL HIGH (ref 0.0–149.0)
VLDL: 33.8 mg/dL (ref 0.0–40.0)

## 2022-03-12 LAB — HEPATIC FUNCTION PANEL
ALT: 16 U/L (ref 0–53)
AST: 15 U/L (ref 0–37)
Albumin: 4.5 g/dL (ref 3.5–5.2)
Alkaline Phosphatase: 64 U/L (ref 39–117)
Bilirubin, Direct: 0.2 mg/dL (ref 0.0–0.3)
Total Bilirubin: 0.7 mg/dL (ref 0.2–1.2)
Total Protein: 6.9 g/dL (ref 6.0–8.3)

## 2022-03-12 LAB — VITAMIN D 25 HYDROXY (VIT D DEFICIENCY, FRACTURES): VITD: 50.93 ng/mL (ref 30.00–100.00)

## 2022-03-12 LAB — TSH: TSH: 0.82 u[IU]/mL (ref 0.35–5.50)

## 2022-03-12 LAB — HEMOGLOBIN A1C: Hgb A1c MFr Bld: 6 % (ref 4.6–6.5)

## 2022-03-12 LAB — VITAMIN B12: Vitamin B-12: 240 pg/mL (ref 211–911)

## 2022-03-12 NOTE — Addendum Note (Signed)
Addended by: Wyvonne Lenz on: 03/12/2022 04:30 PM   Modules accepted: Orders

## 2022-03-12 NOTE — Progress Notes (Signed)
   Subjective:    Patient ID: Anthony Benton., male    DOB: [birth year], 71 y.o.   MRN: [id number]  HPI Here to follow up on a LLL pneumonia. This was diagnosed on exam when he was here on 02-27-22, even though his CXR that day was clear. He was treated with 10 days of Azithromycin, and now he feels better. He still has an occasional dry cough, but there is no SOB. No fever. He also complains of one week of bbilateral low back pain which he thinks my be a "kidney infection". No urinary urgency or burning. He says the pain is worst when he sits ofr long periods and better when he is up moving around.    Review of Systems  Constitutional: Negative.   Respiratory:  Positive for cough. Negative for shortness of breath and wheezing.   Cardiovascular: Negative.   Gastrointestinal: Negative.   Genitourinary: Negative.   Musculoskeletal:  Positive for back pain.       Objective:   Physical Exam Constitutional:      Appearance: Normal appearance. He is not ill-appearing.  Cardiovascular:     Rate and Rhythm: Normal rate and regular rhythm.     Pulses: Normal pulses.     Heart sounds: Normal heart sounds.  Pulmonary:     Effort: Pulmonary effort is normal.     Breath sounds: Normal breath sounds. No wheezing, rhonchi or rales.  Abdominal:     Tenderness: There is no right CVA tenderness or left CVA tenderness.  Musculoskeletal:     Comments: He is tender on both sides of the lower back. There is a fair amount of spasm in these muscles. ROM is full.   Neurological:     Mental Status: He is alert.           Assessment & Plan:  He has recovered from a LLL pneumonia. His urine is clear. His back pain is due to muscle spasms, so he can use heat and Flexeril as needed.  Alysia Penna, MD

## 2022-03-14 ENCOUNTER — Telehealth: Payer: Self-pay | Admitting: Family Medicine

## 2022-03-14 NOTE — Telephone Encounter (Signed)
Pt is calling and would like blood work results his nervous. Pt was seen monday

## 2022-03-16 NOTE — Telephone Encounter (Signed)
Reviewed lab results with pt verbalized understanding 

## 2022-03-19 ENCOUNTER — Telehealth: Payer: Self-pay | Admitting: Family Medicine

## 2022-03-19 NOTE — Telephone Encounter (Signed)
Pt is calling and would like to go back on atorvastatin 40 mg instead of taking 80 mg due to  having hot flashes hot then cold and cramps under his rib cage along with night sweats. Please advise

## 2022-03-20 ENCOUNTER — Encounter: Payer: Self-pay | Admitting: Family Medicine

## 2022-03-20 ENCOUNTER — Ambulatory Visit (INDEPENDENT_AMBULATORY_CARE_PROVIDER_SITE_OTHER): Payer: Medicare HMO | Admitting: Family Medicine

## 2022-03-20 VITALS — BP 146/76 | HR 80 | Temp 97.8°F | Ht 69.0 in | Wt 170.3 lb

## 2022-03-20 DIAGNOSIS — R61 Generalized hyperhidrosis: Secondary | ICD-10-CM | POA: Diagnosis not present

## 2022-03-20 NOTE — Progress Notes (Signed)
Established Patient Office Visit  Subjective   Patient ID: Anthony Benton., male    DOB: 1951-01-02  Age: 71 y.o. MRN: [id number]  Chief Complaint  Patient presents with   Cough    Patient complains of cough, x1 month,    Night Sweats    HPI   Seen with a chief complaint of some intermittent cough past month and some recent night sweats.  He actually was seen about a week ago and had x-ray which was read as negative.  He was treated with doxycycline and Zithromax.  Cough is actually somewhat improved.  He had a couple nights of sweats but does relate that his apartment thermostat was not working well and that temperature was set at 79.  He thinks this may have had something to do with the night sweats.  He denies any documented fever.  He states that he has lost about 15 pounds with decreased appetite past few weeks but documented weight back in September was only 1 pound different from today.  Chest x-ray on 10 October showed no active cardiopulmonary disease.  He denies any hemoptysis.  No recent chest pain.  And multiple recent labs done which were basically mostly unremarkable.  Past Medical History:  Diagnosis Date   Arthritis    neck, knees; seen Dr. Rhona Raider, Norco 3-4 per day, 14 yrs, stable   Coronary artery disease    Hyperlipidemia    Hypertension    Insomnia    OCD (obsessive compulsive disorder)    with anxiety   Stuttering    uses Diazepam   Tobacco abuse 1999, quit   Past Surgical History:  Procedure Laterality Date   [place]  03/19/11   stent X 2, in [place]  10/12   [place]      reports that he has quit smoking. He has never used smokeless tobacco. He reports that he does not drink alcohol. No history on file for drug use. family history includes Arthritis in his father and sister; Breast cancer in his sister; Heart attack in his father; Hypertension in  his father; Prostate cancer in his father. Allergies  Allergen Reactions   Penicillins Anaphylaxis    DID THE REACTION INVOLVE: Swelling of the face/tongue/throat, SOB, or low BP? Anaphylaxis Sudden or severe rash/hives, skin peeling, or the inside of the mouth or nose? Y Did it require medical treatment? Y When did it last happen?  Over 40 years ago. If all above answers are "NO", may proceed with cephalosporin use.    Doxazosin     Review of Systems  Constitutional:  Negative for chills and fever.  Respiratory:  Positive for cough.   Cardiovascular:  Negative for chest pain.  Gastrointestinal:  Negative for abdominal pain, blood in stool, diarrhea, nausea and vomiting.  Genitourinary:  Negative for dysuria.      Objective:     BP (!) 146/76 (BP Location: Left Arm, Patient Position: Sitting, Cuff Size: Normal)   Pulse 80   Temp 97.8 F (36.6 C) (Oral)   Ht '5\' 9"'$  (1.753 m)   Wt 170 lb 4.8 oz (77.2 kg)   SpO2 96%   BMI 25.15 kg/m    Physical Exam Vitals reviewed.  Constitutional:      Appearance: Normal appearance.  HENT:     Right Ear: Tympanic membrane normal.     Left Ear: Tympanic membrane  normal.     Mouth/Throat:     Mouth: Mucous membranes are moist.     Pharynx: Oropharynx is clear.  Cardiovascular:     Rate and Rhythm: Normal rate and regular rhythm.  Pulmonary:     Effort: Pulmonary effort is normal.     Breath sounds: Normal breath sounds. No wheezing or rales.  Abdominal:     Palpations: Abdomen is soft. There is no mass.     Tenderness: There is no abdominal tenderness. There is no guarding or rebound.  Musculoskeletal:     Cervical back: Neck supple.  Lymphadenopathy:     Cervical: No cervical adenopathy.  Neurological:     Mental Status: He is alert.      No results found for any visits on 03/20/22.    The 10-year ASCVD risk score (Arnett DK, et al., 2019) is: 26.5%    Assessment & Plan:   Patient presents with some recent night  sweats.  He had multiple recent labs which were unremarkable.  Recent chest x-ray showed no infiltrate or mass.  He states his thermostat has been messed up in his apartment and had been set at 34 and he thinks this may have something to do with the night sweats.  This was reportedly fixed earlier today.  Nonfocal exam.  Cough gradually improving  -Observe for now.  Follow-up promptly for any chills, documented fever, or any other new symptoms  No follow-ups on file.    Carolann Littler, MD

## 2022-03-20 NOTE — Telephone Encounter (Signed)
That's fine. He can take 1/2 tablet a day (or 40 mg)

## 2022-03-20 NOTE — Telephone Encounter (Signed)
Spoke with patient about message, voiced understanding.   Pt has appointment today 03/20/22 with Dr. Elease Hashimoto at [facility]

## 2022-05-01 ENCOUNTER — Other Ambulatory Visit: Payer: Self-pay | Admitting: Interventional Cardiology

## 2022-05-16 ENCOUNTER — Ambulatory Visit: Payer: Medicare HMO | Admitting: Family Medicine

## 2022-05-16 DIAGNOSIS — N3289 Other specified disorders of bladder: Secondary | ICD-10-CM | POA: Diagnosis not present

## 2022-05-16 DIAGNOSIS — R1084 Generalized abdominal pain: Secondary | ICD-10-CM | POA: Diagnosis not present

## 2022-05-16 DIAGNOSIS — Z85820 Personal history of malignant melanoma of skin: Secondary | ICD-10-CM | POA: Diagnosis not present

## 2022-05-16 DIAGNOSIS — N200 Calculus of kidney: Secondary | ICD-10-CM | POA: Diagnosis not present

## 2022-05-16 DIAGNOSIS — Z87442 Personal history of urinary calculi: Secondary | ICD-10-CM | POA: Diagnosis not present

## 2022-05-16 DIAGNOSIS — N289 Disorder of kidney and ureter, unspecified: Secondary | ICD-10-CM | POA: Diagnosis not present

## 2022-05-25 ENCOUNTER — Ambulatory Visit: Payer: Medicare HMO | Attending: Interventional Cardiology | Admitting: Interventional Cardiology

## 2022-05-25 ENCOUNTER — Encounter: Payer: Self-pay | Admitting: Interventional Cardiology

## 2022-05-25 ENCOUNTER — Telehealth: Payer: Self-pay | Admitting: Family Medicine

## 2022-05-25 VITALS — BP 136/70 | HR 60 | Ht 69.0 in | Wt 171.6 lb

## 2022-05-25 DIAGNOSIS — I10 Essential (primary) hypertension: Secondary | ICD-10-CM | POA: Diagnosis not present

## 2022-05-25 DIAGNOSIS — I2510 Atherosclerotic heart disease of native coronary artery without angina pectoris: Secondary | ICD-10-CM

## 2022-05-25 DIAGNOSIS — E782 Mixed hyperlipidemia: Secondary | ICD-10-CM | POA: Diagnosis not present

## 2022-05-25 DIAGNOSIS — M545 Low back pain, unspecified: Secondary | ICD-10-CM | POA: Diagnosis not present

## 2022-05-25 MED ORDER — NITROGLYCERIN 0.4 MG SL SUBL: 0.4000 mg | SUBLINGUAL_TABLET | SUBLINGUAL | 6 refills | Status: AC | PRN

## 2022-05-25 NOTE — Telephone Encounter (Signed)
FYI Pt is calling to let dr fry know he will took a half or whole pill  for his back hydrocodone 5-325 mg.Pt did call  EMS around 11 pm last night and had the option to go to er he decline. Pt will see dr Rhona Raider today

## 2022-05-25 NOTE — Patient Instructions (Signed)
Medication Instructions:  Your physician recommends that you continue on your current medications as directed. Please refer to the Current Medication list given to you today.  *If you need a refill on your cardiac medications before your next appointment, please call your pharmacy*   Lab Work: none If you have labs (blood work) drawn today and your tests are completely normal, you will receive your results only by: MyChart Message (if you have MyChart) OR A paper copy in the mail If you have any lab test that is abnormal or we need to change your treatment, we will call you to review the results.   Testing/Procedures: none   Follow-Up: At [facility], you and your health needs are our priority.  As part of our continuing mission to provide you with exceptional heart care, we have created designated Provider Care Teams.  These Care Teams include your primary Cardiologist (physician) and Advanced Practice Providers (APPs -  Physician Assistants and Nurse Practitioners) who all work together to provide you with the care you need, when you need it.  We recommend signing up for the patient portal called "MyChart".  Sign up information is provided on this After Visit Summary.  MyChart is used to connect with patients for Virtual Visits (Telemedicine).  Patients are able to view lab/test results, encounter notes, upcoming appointments, etc.  Non-urgent messages can be sent to your provider as well.   To learn more about what you can do with MyChart, go to [email or web address].    Your next appointment:   12 month(s)  The format for your next appointment:   In Person  Provider:   Jayadeep Varanasi, MD     Other Instructions    Important Information About Sugar       

## 2022-05-25 NOTE — Progress Notes (Signed)
Cardiology Office Note   Date:  05/25/2022   ID:  Anthony Benton., DOB 07-10-1950, MRN [id number]  PCP:  Laurey Morale, MD    No chief complaint on file.  CAD  Wt Readings from Last 3 Encounters:  05/25/22 171 lb 9.6 oz (77.8 kg)  03/20/22 170 lb 4.8 oz (77.2 kg)  03/12/22 170 lb (77.1 kg)       History of Present Illness: Anthony Benton. is a 72 y.o. male    with history of CAD status post DES to the RCA times 06/2010 at outside hospital.  Also has HLD, hypertension.   He had 2 stents to the RCA, 3.5x 15 Xience and 3.5 x 8 Xience in 02/2011 at an outside hospital.  He was flown by helicopter but he did not have a heart attack.  His initial sx were associated with walking on his long  Driveway, which he would do for exercise.   He has high cholesterol.  It runs in the family.  Mother's cholesterol is over 300.    His sister passed away in 2022/11/07 from bone cancer.  His mother was in a nursing home in 2018.   He remains actively involved in his church and is now a born again [place].   In 2019, it was noted: he was " Separated from wife, under a lot of stress, says he's homeless. Not eating properly, very depressed. Several friends have died. Staying with different people from church. 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 "   He found a place in Feb 2020.  He feels the stress continues.    Noted in 03/2021: "He was started on lisinopril, but feels that he was having side effects. He thinks he has bone pain.     He does not check his BP at home.  He has not been exercise. He had issues with his ex-wife.  He reports being lonely.   Bothered most by emotional stress and sadness. Cold weather makes it worse as well.  He is considering joining a gym.   Has an enlarged prostate followed by Dr. Jeffie Pollock."  Still has had some stress from his separation from his wife.   Denies :  Chest pain. Dizziness. Leg edema. Nitroglycerin use. Orthopnea. Palpitations. Paroxysmal nocturnal dyspnea. Shortness of breath. Syncope.    Past Medical History:  Diagnosis Date   Arthritis    neck, knees; seen Dr. Rhona Raider, Norco 3-4 per day, 14 yrs, stable   Coronary artery disease    Hyperlipidemia    Hypertension    Insomnia    OCD (obsessive compulsive disorder)    with anxiety   Stuttering    uses Diazepam   Tobacco abuse 1999, quit    Past Surgical History:  Procedure Laterality Date   [place]  03/19/11   stent X 2, in [place]  10/12   [place] EXTRACTION       Current Outpatient Medications  Medication Sig Dispense Refill   Acetaminophen (TYLENOL EXTRA STRENGTH PO) Take 500 mg by mouth daily as needed (pain).      amLODipine (NORVASC) 2.5 MG tablet TAKE ONE TABLET BY MOUTH DAILY FOR HIGH BLOOD PRESSURE 90 tablet 0   aspirin 81 MG tablet Take 81 mg by mouth daily.  atorvastatin (LIPITOR) 80 MG tablet Take 1 tablet (80 mg total) by mouth daily. 90 tablet 1   diazepam (VALIUM) 5 MG tablet TAKE 1 TABLET BY MOUTH THREE TIMES A DAY AS NEEDED FOR ANXIETY AND SPEECH 90 tablet 5   HYDROcodone bit-homatropine (HYCODAN) 5-1.5 MG/5ML syrup Take 5 mLs by mouth every 4 (four) hours as needed for cough. 240 mL 0   lisinopril (ZESTRIL) 40 MG tablet TAKE 1 TABLET BY MOUTH EVERY DAY 90 tablet 0   metoprolol succinate (TOPROL-XL) 50 MG 24 hr tablet TAKE 1 TABLET BY MOUTH EVERY DAY WITH OR IMMEDIATELY FOLLOWING A MEAL 90 tablet 1   Multiple Vitamin (MULTIVITAMIN) tablet Take 1 tablet by mouth daily.     Omega-3 Fatty Acids (FISH OIL) 1000 MG CAPS Take 1,000 mg by mouth daily.     Vitamin D, Ergocalciferol, (DRISDOL) 1.25 MG (50000 UNIT) CAPS capsule Take 1 capsule (50,000 Units total) by mouth every 7 (seven) days. 12 capsule 3   cyclobenzaprine (FLEXERIL) 10 MG tablet Take 1 tablet (10 mg total)  by mouth 3 (three) times daily as needed for muscle spasms. (Patient not taking: Reported on 05/25/2022) 60 tablet 2   Vitamin D, Ergocalciferol, (DRISDOL) 1.25 MG (50000 UNIT) CAPS capsule Take 50,000 Units by mouth every 7 (seven) days. (Patient not taking: Reported on 05/25/2022)     No current facility-administered medications for this visit.    Allergies:   Penicillins and Doxazosin    Social History:  The patient  reports that he has quit smoking. He has never used smokeless tobacco. He reports that he does not drink alcohol.   Family History:  The patient's family history includes Arthritis in his father and sister; Breast cancer in his sister; Heart attack in his father; Hypertension in his father; Prostate cancer in his father.    ROS:  Please see the history of present illness.   Otherwise, review of systems are positive for feels lonely.   All other systems are reviewed and negative.    PHYSICAL EXAM: VS:  BP 136/70   Pulse 60   Ht '5\' 9"'$  (1.753 m)   Wt 171 lb 9.6 oz (77.8 kg)   SpO2 95%   BMI 25.34 kg/m  , BMI Body mass index is 25.34 kg/m. GEN: Well nourished, well developed, in no acute distress HEENT: normal Neck: no JVD, carotid bruits, or masses Cardiac: RRR; no murmurs, rubs, or gallops,no edema  Respiratory:  clear to auscultation bilaterally, normal work of breathing GI: soft, nontender, nondistended, + BS MS: no deformity or atrophy Skin: warm and dry, no rash Neuro:  Strength and sensation are intact; speech impediment Psych: euthymic mood, full affect   EKG:   The ekg ordered today demonstrates NSR, RBBB, inferior Q waves   Recent Labs: 03/12/2022: ALT 16; BUN 12; Creatinine, Ser 0.83; Hemoglobin 13.9; Platelets 193.0; Potassium 3.8; Sodium 139; TSH 0.82   Lipid Panel    Component Value Date/Time   CHOL 172 03/12/2022 1409   CHOL 179 04/10/2021 1030   TRIG 169.0 (H) 03/12/2022 1409   HDL 54.90 03/12/2022 1409   HDL 40 04/10/2021 1030   CHOLHDL 3  03/12/2022 1409   VLDL 33.8 03/12/2022 1409   LDLCALC 83 03/12/2022 1409   LDLCALC 107 (H) 04/10/2021 1030   LDLCALC 129 (H) 03/02/2020 1455   LDLDIRECT 99.0 09/05/2021 1350     Other studies Reviewed: Additional studies/ records that were reviewed today with results demonstrating: labs reviewed,normal renal function.  ASSESSMENT AND PLAN:  CAD: no angina. Continue aggressive secondary prevention.  Refill SL NTG.  Hypertension: The current medical regimen is effective;  continue present plan and medications. Hyperlipidemia: LDL 83.  Continue lipid lowering therapy.  Anxiety: Social situation is difficult.  Spends a lot of time alone.  PreDM: A1C 6.0.  High fiber diet.     Current medicines are reviewed at length with the patient today.  The patient concerns regarding his medicines were addressed.  The following changes have been made:  No change  Labs/ tests ordered today include:  No orders of the defined types were placed in this encounter.   Recommend 150 minutes/week of aerobic exercise Low fat, low carb, high fiber diet recommended  Disposition:   FU in 1 year   Signed, Larae Grooms, MD  05/25/2022 2:36 PM    [facility] [address] [place], [place], [place]  [address] Phone: [phone]; Fax: [phone]

## 2022-06-01 ENCOUNTER — Ambulatory Visit: Payer: Medicare HMO | Admitting: Interventional Cardiology

## 2022-07-10 DIAGNOSIS — L308 Other specified dermatitis: Secondary | ICD-10-CM | POA: Diagnosis not present

## 2022-07-10 DIAGNOSIS — Q800 Ichthyosis vulgaris: Secondary | ICD-10-CM | POA: Diagnosis not present

## 2022-07-10 DIAGNOSIS — Z85828 Personal history of other malignant neoplasm of skin: Secondary | ICD-10-CM | POA: Diagnosis not present

## 2022-07-10 DIAGNOSIS — D225 Melanocytic nevi of trunk: Secondary | ICD-10-CM | POA: Diagnosis not present

## 2022-07-10 DIAGNOSIS — L821 Other seborrheic keratosis: Secondary | ICD-10-CM | POA: Diagnosis not present

## 2022-07-10 DIAGNOSIS — D485 Neoplasm of uncertain behavior of skin: Secondary | ICD-10-CM | POA: Diagnosis not present

## 2022-07-10 DIAGNOSIS — D692 Other nonthrombocytopenic purpura: Secondary | ICD-10-CM | POA: Diagnosis not present

## 2022-07-10 DIAGNOSIS — D2272 Melanocytic nevi of left lower limb, including hip: Secondary | ICD-10-CM | POA: Diagnosis not present

## 2022-07-13 ENCOUNTER — Other Ambulatory Visit: Payer: Self-pay | Admitting: Interventional Cardiology

## 2022-07-21 ENCOUNTER — Other Ambulatory Visit: Payer: Self-pay | Admitting: Interventional Cardiology

## 2022-07-30 ENCOUNTER — Ambulatory Visit (INDEPENDENT_AMBULATORY_CARE_PROVIDER_SITE_OTHER): Payer: Medicare HMO | Admitting: Family Medicine

## 2022-07-30 ENCOUNTER — Encounter: Payer: Self-pay | Admitting: Family Medicine

## 2022-07-30 VITALS — BP 120/70 | HR 70 | Temp 98.7°F | Resp 16 | Ht 69.0 in | Wt 169.2 lb

## 2022-07-30 DIAGNOSIS — J069 Acute upper respiratory infection, unspecified: Secondary | ICD-10-CM | POA: Diagnosis not present

## 2022-07-30 DIAGNOSIS — R059 Cough, unspecified: Secondary | ICD-10-CM

## 2022-07-30 LAB — POC COVID19 BINAXNOW: SARS Coronavirus 2 Ag: NEGATIVE

## 2022-07-30 LAB — POCT INFLUENZA A/B
Influenza A, POC: NEGATIVE
Influenza B, POC: NEGATIVE

## 2022-07-30 NOTE — Patient Instructions (Addendum)
A few things to remember from today's visit:  URI, acute  Cough, unspecified type - Plan: POC COVID-19, POCT Influenza A/B Nasal saline irrigations as needed.  If you need refills for medications you take chronically, please call your pharmacy. Do not use My Chart to request refills or for acute issues that need immediate attention. If you send a my chart message, it may take a few days to be addressed, specially if I am not in the office.  Please be sure medication list is accurate. If a new problem present, please set up appointment sooner than planned today.

## 2022-07-30 NOTE — Progress Notes (Signed)
ACUTE VISIT Chief Complaint  Patient presents with   Cough   Nasal Congestion   itchy throat   HPI: Anthony Benton. is a 72 y.o. male  with PMHx significant for CAD,HTN,GERD,and anxiety here today complaining of respiratory symptoms described above.  He reports experiencing a tickle in his throat and copious clear rhinorrhea, going through two boxes of Kleenexes.  Cough This is a new problem. The current episode started in the past 7 days. The problem has been unchanged. The cough is Non-productive. Associated symptoms include heartburn (with episodes of "deep" cough.), nasal congestion, postnasal drip and rhinorrhea. Pertinent negatives include no chest pain, chills, ear congestion, ear pain, fever, headaches, hemoptysis, rash, shortness of breath, sweats, weight loss or wheezing. Nothing aggravates the symptoms. He has tried OTC cough suppressant for the symptoms. The treatment provided moderate relief. His past medical history is significant for environmental allergies.   He is currently taking Tylenol and NyQuil for symptom relief. He mentions that he lives in low-income housing with dirty air vents, which he believes could be aggravating his symptoms.   Review of Systems  Constitutional:  Positive for fatigue. Negative for chills, fever and weight loss.  HENT:  Positive for postnasal drip and rhinorrhea. Negative for ear pain and mouth sores.   Respiratory:  Positive for cough. Negative for hemoptysis, shortness of breath and wheezing.   Cardiovascular:  Negative for chest pain.  Gastrointestinal:  Positive for heartburn (with episodes of "deep" cough.). Negative for abdominal pain, nausea and vomiting.  Skin:  Negative for rash.  Allergic/Immunologic: Positive for environmental allergies.  Neurological:  Negative for syncope and headaches.  Psychiatric/Behavioral:  Negative for confusion. The patient is nervous/anxious.   See other pertinent positives and negatives in  HPI.  Current Outpatient Medications on File Prior to Visit  Medication Sig Dispense Refill   Acetaminophen (TYLENOL EXTRA STRENGTH PO) Take 500 mg by mouth daily as needed (pain).      amLODipine (NORVASC) 2.5 MG tablet TAKE ONE TABLET BY MOUTH DAILY FOR HIGH BLOOD PRESSURE 90 tablet 3   aspirin 81 MG tablet Take 81 mg by mouth daily.     atorvastatin (LIPITOR) 80 MG tablet Take 1 tablet (80 mg total) by mouth daily. 90 tablet 1   diazepam (VALIUM) 5 MG tablet TAKE 1 TABLET BY MOUTH THREE TIMES A DAY AS NEEDED FOR ANXIETY AND SPEECH 90 tablet 5   lisinopril (ZESTRIL) 40 MG tablet Take 1 tablet (40 mg total) by mouth daily. 90 tablet 3   metoprolol succinate (TOPROL-XL) 50 MG 24 hr tablet TAKE 1 TABLET BY MOUTH EVERY DAY WITH OR IMMEDIATELY FOLLOWING A MEAL 90 tablet 1   Multiple Vitamin (MULTIVITAMIN) tablet Take 1 tablet by mouth daily.     nitroGLYCERIN (NITROSTAT) 0.4 MG SL tablet Place 1 tablet (0.4 mg total) under the tongue every 5 (five) minutes as needed for chest pain. 25 tablet 6   Omega-3 Fatty Acids (FISH OIL) 1000 MG CAPS Take 1,000 mg by mouth daily.     No current facility-administered medications on file prior to visit.   Past Medical History:  Diagnosis Date   Arthritis    neck, knees; seen Dr. Rhona Raider, Norco 3-4 per day, 14 yrs, stable   Coronary artery disease    Hyperlipidemia    Hypertension    Insomnia    OCD (obsessive compulsive disorder)    with anxiety   Stuttering    uses Diazepam   Tobacco abuse 1999,  quit   Allergies  Allergen Reactions   Penicillins Anaphylaxis    DID THE REACTION INVOLVE: Swelling of the face/tongue/throat, SOB, or low BP? Anaphylaxis Sudden or severe rash/hives, skin peeling, or the inside of the mouth or nose? Y Did it require medical treatment? Y When did it last happen?  Over 40 years ago. If all above answers are "NO", may proceed with cephalosporin use.    Doxazosin     Social History   Socioeconomic History    Marital status: Single    Spouse name: Not on file   Number of children: Not on file   Years of education: Not on file   Highest education level: Not on file  Occupational History   Not on file  Tobacco Use   Smoking status: Former   Smokeless tobacco: Never  Vaping Use   Vaping Use: Never used  Substance and Sexual Activity   Alcohol use: No    Alcohol/week: 0.0 standard drinks of alcohol   Drug use: Not on file   Sexual activity: Not on file  Other Topics Concern   Not on file  Social History Narrative   Not on file   Social Determinants of Health   Financial Resource Strain: Not on file  Food Insecurity: Not on file  Transportation Needs: Not on file  Physical Activity: Not on file  Stress: Not on file  Social Connections: Not on file   Vitals:   07/30/22 1537  BP: 120/70  Pulse: 70  Resp: 16  Temp: 98.7 F (37.1 C)  SpO2: 97%   Body mass index is 24.99 kg/m.  Physical Exam Vitals and nursing note reviewed.  Constitutional:      General: He is not in acute distress.    Appearance: He is well-developed. He is not ill-appearing.  HENT:     Head: Normocephalic and atraumatic.     Right Ear: Tympanic membrane, ear canal and external ear normal.     Left Ear: Tympanic membrane, ear canal and external ear normal.     Nose: Septal deviation and rhinorrhea present. No congestion.     Mouth/Throat:     Mouth: Mucous membranes are moist.     Pharynx: Oropharynx is clear.  Eyes:     Conjunctiva/sclera: Conjunctivae normal.  Cardiovascular:     Rate and Rhythm: Normal rate and regular rhythm.     Heart sounds: No murmur heard. Pulmonary:     Effort: Pulmonary effort is normal. No respiratory distress.     Breath sounds: Normal breath sounds. No stridor.  Lymphadenopathy:     Cervical: No cervical adenopathy.  Skin:    General: Skin is warm.     Findings: No erythema or rash.  Neurological:     Mental Status: He is alert and oriented to person, place, and  time.  Psychiatric:        Mood and Affect: Affect normal. Mood is anxious.   ASSESSMENT AND PLAN:  URI, acute  Cough, unspecified type -     POC COVID-19 BinaxNow -     POCT Influenza A/B  Symptoms suggests a viral etiology, symptomatic treatment recommended. Instructed to monitor for signs of complications, including new onset of fever among some, clearly instructed about warning signs. Continue Nyquil and Dayquil , monitor BP regularly. Adequate hydration. Nasal saline irrigations as needed. I also explained that cough and nasal congestion can last a few days and sometimes weeks. F/U as needed.  Return if symptoms worsen or fail  to improve.  Kashia Brossard G. [place], MD  [facility]. [place] office.

## 2022-08-03 ENCOUNTER — Other Ambulatory Visit: Payer: Self-pay

## 2022-08-03 ENCOUNTER — Telehealth: Payer: Self-pay

## 2022-08-03 MED ORDER — AZITHROMYCIN 250 MG PO TABS: ORAL_TABLET | ORAL | 0 refills | Status: DC

## 2022-08-03 MED ORDER — HYDROCODONE BIT-HOMATROP MBR 5-1.5 MG/5ML PO SOLN: 5.0000 mL | ORAL | 0 refills | Status: DC | PRN

## 2022-08-03 NOTE — Telephone Encounter (Signed)
Pt is aware to pick up Rx at his pharmacy

## 2022-08-03 NOTE — Telephone Encounter (Signed)
I sent in a Zpack and cough syrup

## 2022-08-09 ENCOUNTER — Telehealth: Payer: Self-pay | Admitting: Family Medicine

## 2022-08-09 NOTE — Telephone Encounter (Signed)
[facility] Nurse Triage called and stated pt was c/o wheezing and difficulty breathing. Triage outcome for pt to be seen in the ER or Urgent Care however pt declined.   Please advise.

## 2022-08-10 NOTE — Telephone Encounter (Signed)
Noted  

## 2022-08-10 NOTE — Telephone Encounter (Signed)
Pt is calling back and waiting for a call back

## 2022-08-15 ENCOUNTER — Ambulatory Visit (INDEPENDENT_AMBULATORY_CARE_PROVIDER_SITE_OTHER): Payer: Medicare HMO | Admitting: Family Medicine

## 2022-08-15 ENCOUNTER — Ambulatory Visit: Payer: Medicare HMO | Admitting: Family Medicine

## 2022-08-15 ENCOUNTER — Other Ambulatory Visit: Payer: Self-pay

## 2022-08-15 ENCOUNTER — Encounter: Payer: Self-pay | Admitting: Family Medicine

## 2022-08-15 ENCOUNTER — Telehealth: Payer: Self-pay | Admitting: Family Medicine

## 2022-08-15 VITALS — BP 130/78 | HR 82 | Temp 97.9°F | Wt 169.0 lb

## 2022-08-15 DIAGNOSIS — J019 Acute sinusitis, unspecified: Secondary | ICD-10-CM | POA: Diagnosis not present

## 2022-08-15 MED ORDER — LEVOFLOXACIN 500 MG PO TABS: 500.0000 mg | ORAL_TABLET | Freq: Every day | ORAL | 0 refills | Status: DC

## 2022-08-15 MED ORDER — METHYLPREDNISOLONE ACETATE 80 MG/ML IJ SUSP
80.0000 mg | Freq: Once | INTRAMUSCULAR | Status: AC
  Administered 2022-08-15: 80 mg via INTRAMUSCULAR

## 2022-08-15 MED ORDER — METHYLPREDNISOLONE ACETATE 40 MG/ML IJ SUSP
40.0000 mg | Freq: Once | INTRAMUSCULAR | Status: AC
  Administered 2022-08-15: 40 mg via INTRAMUSCULAR

## 2022-08-15 MED ORDER — CEFUROXIME AXETIL 500 MG PO TABS: 500.0000 mg | ORAL_TABLET | Freq: Two times a day (BID) | ORAL | 0 refills | Status: AC

## 2022-08-15 NOTE — Telephone Encounter (Signed)
Cancel the Levaquin and call in Cefuroxime 500 mg BID for 10 days

## 2022-08-15 NOTE — Addendum Note (Signed)
Addended by: Wyvonne Lenz on: 08/15/2022 09:30 AM   Modules accepted: Orders

## 2022-08-15 NOTE — Telephone Encounter (Signed)
Rx sent 

## 2022-08-15 NOTE — Telephone Encounter (Signed)
Pt is calling and he is 30% better from injection this morning and pt is going out of town and he read side effects and does not want to  take cause it may  cause diarrhea etc. Pt would like to take another abx that is not as strong [id number] [place], [place] Phone: [phone]  Fax: [phone]

## 2022-08-15 NOTE — Progress Notes (Signed)
   Subjective:    Patient ID: [name]., male    DOB: 1950-10-06, 72 y.o.   MRN: [id number]  HPI Here for several weeks of sinus congestion, PND, blowing bloody mucus from the nose, and a dry cough. No fever. He took a Zpack starting on 08-03-22, but this has not helped. No SOB.    Review of Systems  Constitutional: Negative.   HENT:  Positive for congestion, postnasal drip and sinus pressure. Negative for ear pain and sore throat.   Eyes: Negative.   Respiratory:  Positive for cough. Negative for shortness of breath and wheezing.        Objective:   Physical Exam Constitutional:      Appearance: Normal appearance.  HENT:     Right Ear: Tympanic membrane, ear canal and external ear normal.     Left Ear: Tympanic membrane, ear canal and external ear normal.     Nose: Nose normal.     Mouth/Throat:     Pharynx: Oropharynx is clear.  Eyes:     Conjunctiva/sclera: Conjunctivae normal.  Pulmonary:     Effort: Pulmonary effort is normal.     Breath sounds: Normal breath sounds.  Lymphadenopathy:     Cervical: No cervical adenopathy.  Neurological:     Mental Status: He is alert.           Assessment & Plan:  Sinusitis, treat with 10 days of Levaquin and a shot of DepoMedrol.  .mecred

## 2022-08-21 ENCOUNTER — Other Ambulatory Visit: Payer: Self-pay | Admitting: Family Medicine

## 2022-08-22 ENCOUNTER — Encounter: Payer: Self-pay | Admitting: Family Medicine

## 2022-08-22 ENCOUNTER — Ambulatory Visit (INDEPENDENT_AMBULATORY_CARE_PROVIDER_SITE_OTHER): Payer: Medicare HMO | Admitting: Family Medicine

## 2022-08-22 VITALS — BP 126/70 | HR 68 | Temp 97.6°F | Wt 158.5 lb

## 2022-08-22 DIAGNOSIS — J019 Acute sinusitis, unspecified: Secondary | ICD-10-CM | POA: Diagnosis not present

## 2022-08-22 DIAGNOSIS — J3089 Other allergic rhinitis: Secondary | ICD-10-CM

## 2022-08-22 MED ORDER — DIAZEPAM 5 MG PO TABS: 5.0000 mg | ORAL_TABLET | Freq: Three times a day (TID) | ORAL | 5 refills | Status: DC | PRN

## 2022-08-22 NOTE — Progress Notes (Signed)
   Subjective:    Patient ID: Anthony Benton., male    DOB: 06-16-1950, 72 y.o.   MRN: [id number]  HPI Here to follow up on a sinus infection. We saw him on 08-15-22 and we started him on 10 days of Cefuroxime. He is about half way through this, but he is not feeling much better. He is also taking Zyrtec daily. He has PND, is blowing yellow mucus with spots of blood in it form the nose, and he has a dry cough. No fever.  We have spoken before about how dusty his apartment is, and he cannot get the landlord to clean out the HVAC ducts.   Review of Systems  Constitutional: Negative.   HENT:  Positive for congestion, nosebleeds and postnasal drip. Negative for ear pain, sinus pain, sneezing and sore throat.   Eyes:  Positive for itching.  Respiratory:  Positive for cough. Negative for shortness of breath and wheezing.        Objective:   Physical Exam Constitutional:      Appearance: Normal appearance.  HENT:     Right Ear: Tympanic membrane, ear canal and external ear normal.     Left Ear: Tympanic membrane, ear canal and external ear normal.     Nose: Nose normal.     Mouth/Throat:     Pharynx: Oropharynx is clear.  Eyes:     Conjunctiva/sclera: Conjunctivae normal.  Pulmonary:     Effort: Pulmonary effort is normal.     Breath sounds: Normal breath sounds.  Lymphadenopathy:     Cervical: No cervical adenopathy.  Neurological:     Mental Status: He is alert.           Assessment & Plan:  He has an acute sinusitis, so he will finish out the Cefuroxime. I think he also has significant allergy issues, so we will refer him to Allergy to evaluate.  Alysia Penna, MD

## 2022-08-22 NOTE — Telephone Encounter (Signed)
Last OV-08/15/22/ Last refill-02/02/22--90 tabs, 5 refill  Next OV-08/22/22

## 2022-08-23 ENCOUNTER — Telehealth: Payer: Self-pay | Admitting: Family Medicine

## 2022-08-23 NOTE — Telephone Encounter (Signed)
Tell him to stop the antibiotic. We will stay off antibiotics for now. Let's see what the Allergy doctor wants to do

## 2022-08-23 NOTE — Telephone Encounter (Signed)
Pt was seen yesterday and he does not want to take cefUROXime (CEFTIN) 500 MG tablet  due to it makes him jittery and cough makes him sick and he will not take anymore and he has 3 more days . Please advise

## 2022-08-23 NOTE — Telephone Encounter (Signed)
Spoke with pt advised of Dr Fry recommendation, verbalized understanding 

## 2022-08-30 ENCOUNTER — Telehealth: Payer: Self-pay | Admitting: Family Medicine

## 2022-08-30 NOTE — Telephone Encounter (Signed)
Contacted [facility] to schedule their annual wellness visit. Patient declined to schedule AWV at this time.  DO NOT CALL  Rudell Cobb AWV direct phone # [phone]   Spoke with patient to schedule AWV.  Patient declined and stated he never does AWV appt

## 2022-09-03 DIAGNOSIS — M25561 Pain in right knee: Secondary | ICD-10-CM | POA: Diagnosis not present

## 2022-09-11 ENCOUNTER — Telehealth: Payer: Self-pay | Admitting: Family Medicine

## 2022-09-11 ENCOUNTER — Encounter: Payer: Self-pay | Admitting: Family Medicine

## 2022-09-11 ENCOUNTER — Ambulatory Visit (INDEPENDENT_AMBULATORY_CARE_PROVIDER_SITE_OTHER): Payer: Medicare HMO | Admitting: Family Medicine

## 2022-09-11 ENCOUNTER — Telehealth: Payer: Self-pay | Admitting: Interventional Cardiology

## 2022-09-11 VITALS — BP 128/68 | HR 71 | Temp 97.6°F | Wt 155.0 lb

## 2022-09-11 DIAGNOSIS — N138 Other obstructive and reflux uropathy: Secondary | ICD-10-CM

## 2022-09-11 DIAGNOSIS — F410 Panic disorder [episodic paroxysmal anxiety] without agoraphobia: Secondary | ICD-10-CM | POA: Diagnosis not present

## 2022-09-11 DIAGNOSIS — R739 Hyperglycemia, unspecified: Secondary | ICD-10-CM | POA: Diagnosis not present

## 2022-09-11 DIAGNOSIS — E559 Vitamin D deficiency, unspecified: Secondary | ICD-10-CM | POA: Diagnosis not present

## 2022-09-11 DIAGNOSIS — M545 Low back pain, unspecified: Secondary | ICD-10-CM

## 2022-09-11 DIAGNOSIS — E538 Deficiency of other specified B group vitamins: Secondary | ICD-10-CM | POA: Diagnosis not present

## 2022-09-11 DIAGNOSIS — G8929 Other chronic pain: Secondary | ICD-10-CM | POA: Diagnosis not present

## 2022-09-11 DIAGNOSIS — N401 Enlarged prostate with lower urinary tract symptoms: Secondary | ICD-10-CM | POA: Diagnosis not present

## 2022-09-11 DIAGNOSIS — E782 Mixed hyperlipidemia: Secondary | ICD-10-CM

## 2022-09-11 DIAGNOSIS — R69 Illness, unspecified: Secondary | ICD-10-CM | POA: Diagnosis not present

## 2022-09-11 DIAGNOSIS — F411 Generalized anxiety disorder: Secondary | ICD-10-CM | POA: Diagnosis not present

## 2022-09-11 DIAGNOSIS — I10 Essential (primary) hypertension: Secondary | ICD-10-CM | POA: Diagnosis not present

## 2022-09-11 LAB — CBC WITH DIFFERENTIAL/PLATELET
Basophils Absolute: 0 10*3/uL (ref 0.0–0.1)
Basophils Relative: 0.2 % (ref 0.0–3.0)
Eosinophils Absolute: 0.1 10*3/uL (ref 0.0–0.7)
Eosinophils Relative: 0.5 % (ref 0.0–5.0)
HCT: 44.6 % (ref 39.0–52.0)
Hemoglobin: 14.9 g/dL (ref 13.0–17.0)
Lymphocytes Relative: 18.6 % (ref 12.0–46.0)
Lymphs Abs: 2.1 10*3/uL (ref 0.7–4.0)
MCHC: 33.5 g/dL (ref 30.0–36.0)
MCV: 93.6 fl (ref 78.0–100.0)
Monocytes Absolute: 0.8 10*3/uL (ref 0.1–1.0)
Monocytes Relative: 7.1 % (ref 3.0–12.0)
Neutro Abs: 8.2 10*3/uL — ABNORMAL HIGH (ref 1.4–7.7)
Neutrophils Relative %: 73.6 % (ref 43.0–77.0)
Platelets: 216 10*3/uL (ref 150.0–400.0)
RBC: 4.76 Mil/uL (ref 4.22–5.81)
RDW: 13.4 % (ref 11.5–15.5)
WBC: 11.2 10*3/uL — ABNORMAL HIGH (ref 4.0–10.5)

## 2022-09-11 LAB — VITAMIN D 25 HYDROXY (VIT D DEFICIENCY, FRACTURES): VITD: 63.08 ng/mL (ref 30.00–100.00)

## 2022-09-11 LAB — BASIC METABOLIC PANEL
BUN: 14 mg/dL (ref 6–23)
CO2: 28 mEq/L (ref 19–32)
Calcium: 10.4 mg/dL (ref 8.4–10.5)
Chloride: 102 mEq/L (ref 96–112)
Creatinine, Ser: 0.91 mg/dL (ref 0.40–1.50)
GFR: 84.17 mL/min (ref >60.00)
Glucose, Bld: 84 mg/dL (ref 70–99)
Potassium: 4.3 mEq/L (ref 3.5–5.1)
Sodium: 140 mEq/L (ref 135–145)

## 2022-09-11 LAB — HEPATIC FUNCTION PANEL
ALT: 22 U/L (ref 0–53)
AST: 15 U/L (ref 0–37)
Albumin: 4.4 g/dL (ref 3.5–5.2)
Alkaline Phosphatase: 74 U/L (ref 39–117)
Bilirubin, Direct: 0.1 mg/dL (ref 0.0–0.3)
Total Bilirubin: 0.8 mg/dL (ref 0.2–1.2)
Total Protein: 6.9 g/dL (ref 6.0–8.3)

## 2022-09-11 LAB — LIPID PANEL
Cholesterol: 174 mg/dL (ref 0–200)
HDL: 58.5 mg/dL (ref >39.00)
LDL Cholesterol: 83 mg/dL (ref 0–99)
NonHDL: 115.15
Total CHOL/HDL Ratio: 3
Triglycerides: 160 mg/dL — ABNORMAL HIGH (ref 0.0–149.0)
VLDL: 32 mg/dL (ref 0.0–40.0)

## 2022-09-11 LAB — TSH: TSH: 1.18 u[IU]/mL (ref 0.35–5.50)

## 2022-09-11 LAB — PSA: PSA: 12.97 ng/mL — ABNORMAL HIGH (ref 0.10–4.00)

## 2022-09-11 LAB — VITAMIN B12: Vitamin B-12: 284 pg/mL (ref 211–911)

## 2022-09-11 LAB — HEMOGLOBIN A1C: Hgb A1c MFr Bld: 6.2 % (ref 4.6–6.5)

## 2022-09-11 MED ORDER — ALPRAZOLAM 0.5 MG PO TABS: 0.5000 mg | ORAL_TABLET | Freq: Two times a day (BID) | ORAL | 0 refills | Status: DC | PRN

## 2022-09-11 MED ORDER — CYCLOBENZAPRINE HCL 10 MG PO TABS: 10.0000 mg | ORAL_TABLET | Freq: Three times a day (TID) | ORAL | 0 refills | Status: DC | PRN

## 2022-09-11 MED ORDER — DIAZEPAM 5 MG PO TABS: 5.0000 mg | ORAL_TABLET | Freq: Three times a day (TID) | ORAL | 5 refills | Status: DC | PRN

## 2022-09-11 NOTE — Telephone Encounter (Signed)
Patients states that at times when he is doing stuff he get a pressure in his chest. He states the pressure last for about 10 mins then it goes away. The feeling been there for about a 4-6 weeks. Please advise

## 2022-09-11 NOTE — Telephone Encounter (Signed)
Pt is concerned that dx that he does not currently have showed up on his AVS when printed. He would like to speak to the nurse about making sure his chart is updated. He was specifically concerned about the dx for Vitamin D deficiency and B12 deficiency. Pt is requesting a call to discuss further.   Call back number: [phone].

## 2022-09-11 NOTE — Progress Notes (Unsigned)
Office Visit    Patient Name: Anthony Benton. Date of Encounter: 09/11/2022  Primary Care Provider:  Nelwyn Salisbury, MD Primary Cardiologist:  Lance Muss, MD Primary Electrophysiologist: None   Past Medical History    Past Medical History:  Diagnosis Date   Arthritis    neck, knees; seen Dr. Jerl Santos, [place] 3-4 per day, 14 yrs, stable   Coronary artery disease    Hyperlipidemia    Hypertension    Insomnia    OCD (obsessive compulsive disorder)    with anxiety   Stuttering    uses Diazepam   Tobacco abuse 1999, quit   Past Surgical History:  Procedure Laterality Date   BACK SURGERY  1994, 1997   CARDIAC CATHETERIZATION  03/19/11   stent X 2, in [place]   CORONARY ANGIOPLASTY  10/12   VASECTOMY  1983   WISDOM TOOTH EXTRACTION      Allergies  Allergies  Allergen Reactions   Penicillins Anaphylaxis    DID THE REACTION INVOLVE: Swelling of the face/tongue/throat, SOB, or low BP? Anaphylaxis Sudden or severe rash/hives, skin peeling, or the inside of the mouth or nose? Y Did it require medical treatment? Y When did it last happen?  Over 40 years ago. If all above answers are "NO", may proceed with cephalosporin use.    Doxazosin      History of Present Illness    Anthony Benton.  is a 72 year old male with a PMH of CAD s/p DES x 2 to RCA 2012 in [place], HLD, HTN, arthritis, tobacco abuse, BPH, OCD who presents today for complaint of exertional chest pain.  [name] was initially seen in 2016 by Dr. Eldridge Dace for management of coronary artery disease.  He has not completed an ischemic evaluation since 2012 by chart review.  He contacted the after-hours nurse line on 09/09/2021 with complaint of pressure in his chest that has occurred over the past few weeks.  He has not required nitroglycerin for this discomfort.  He was last seen by Dr. Eldridge Dace on 05/25/2022 and was doing well with no complaints of angina at that time.  Since last being seen in  the office patient reports***.  Patient denies chest pain, palpitations, dyspnea, PND, orthopnea, nausea, vomiting, dizziness, syncope, edema, weight gain, or early satiety.     ***Notes: -Patient has experienced exertional chest pressure over the last few weeks.  He is not required nitroglycerin  Home Medications    Current Outpatient Medications  Medication Sig Dispense Refill   Acetaminophen (TYLENOL EXTRA STRENGTH PO) Take 500 mg by mouth daily as needed (pain).      ALPRAZolam (XANAX) 0.5 MG tablet Take 1 tablet (0.5 mg total) by mouth 2 (two) times daily as needed (for panic attacks). 30 tablet 0   amLODipine (NORVASC) 2.5 MG tablet TAKE ONE TABLET BY MOUTH DAILY FOR HIGH BLOOD PRESSURE 90 tablet 3   aspirin 81 MG tablet Take 81 mg by mouth daily.     atorvastatin (LIPITOR) 80 MG tablet Take 1 tablet (80 mg total) by mouth daily. 90 tablet 1   cyclobenzaprine (FLEXERIL) 10 MG tablet Take 1 tablet (10 mg total) by mouth 3 (three) times daily as needed for muscle spasms. 30 tablet 0   diazepam (VALIUM) 5 MG tablet Take 1 tablet (5 mg total) by mouth every 8 (eight) hours as needed (as needed for speech or for anxiety). 90 tablet 5   lisinopril (ZESTRIL) 40 MG tablet  Take 1 tablet (40 mg total) by mouth daily. 90 tablet 3   metoprolol succinate (TOPROL-XL) 50 MG 24 hr tablet TAKE 1 TABLET BY MOUTH EVERY DAY WITH OR IMMEDIATELY FOLLOWING A MEAL 90 tablet 1   Multiple Vitamin (MULTIVITAMIN) tablet Take 1 tablet by mouth daily.     nitroGLYCERIN (NITROSTAT) 0.4 MG SL tablet Place 1 tablet (0.4 mg total) under the tongue every 5 (five) minutes as needed for chest pain. 25 tablet 6   Omega-3 Fatty Acids (FISH OIL) 1000 MG CAPS Take 1,000 mg by mouth daily.     No current facility-administered medications for this visit.     Review of Systems  Please see the history of present illness.    (+)*** (+)***  All other systems reviewed and are otherwise negative except as noted  above.  Physical Exam    Wt Readings from Last 3 Encounters:  09/11/22 155 lb (70.3 kg)  08/22/22 158 lb 8 oz (71.9 kg)  08/15/22 169 lb (76.7 kg)   ZO:XWRUE were no vitals filed for this visit.,There is no height or weight on file to calculate BMI.  Constitutional:      Appearance: Healthy appearance. Not in distress.  Neck:     Vascular: JVD normal.  Pulmonary:     Effort: Pulmonary effort is normal.     Breath sounds: No wheezing. No rales. Diminished in the bases Cardiovascular:     Normal rate. Regular rhythm. Normal S1. Normal S2.      Murmurs: There is no murmur.  Edema:    Peripheral edema absent.  Abdominal:     Palpations: Abdomen is soft non tender. There is no hepatomegaly.  Skin:    General: Skin is warm and dry.  Neurological:     General: No focal deficit present.     Mental Status: Alert and oriented to person, place and time.     Cranial Nerves: Cranial nerves are intact.  EKG/LABS/ Recent Cardiac Studies    ECG personally reviewed by me today - ***      Risk Assessment/Calculations:   {Does this patient have ATRIAL FIBRILLATION?[phone]}        Lab Results  Component Value Date   WBC 11.2 (H) 09/11/2022   HGB 14.9 09/11/2022   HCT 44.6 09/11/2022   MCV 93.6 09/11/2022   PLT 216.0 09/11/2022   Lab Results  Component Value Date   CREATININE 0.91 09/11/2022   BUN 14 09/11/2022   NA 140 09/11/2022   K 4.3 09/11/2022   CL 102 09/11/2022   CO2 28 09/11/2022   Lab Results  Component Value Date   ALT 22 09/11/2022   AST 15 09/11/2022   ALKPHOS 74 09/11/2022   BILITOT 0.8 09/11/2022   Lab Results  Component Value Date   CHOL 174 09/11/2022   HDL 58.50 09/11/2022   LDLCALC 83 09/11/2022   LDLDIRECT 99.0 09/05/2021   TRIG 160.0 (H) 09/11/2022   CHOLHDL 3 09/11/2022    Lab Results  Component Value Date   HGBA1C 6.2 09/11/2022     Assessment & Plan    1.  Coronary artery disease: -DES x 2 to RCA in 2012 in [place], patient  recently has complaint of exertional chest discomfort and pressure. -Today patient reports*** -We will***  2.  Essential hypertension: -Patient's blood pressure today was*** -Continue***  3.  Hyperlipidemia: -Patient's last LDL was*** -Continue***  4.  GERD: -Patient is currently taking***      Disposition: Follow-up with Renelda Loma  Eldridge Dace, MD or APP in *** months {Are you ordering a CV Procedure (e.g. stress test, cath, DCCV, TEE, etc)?   Press F2        :[id number]}   Medication Adjustments/Labs and Tests Ordered: Current medicines are reviewed at length with the patient today.  Concerns regarding medicines are outlined above.   Signed, Napoleon Form, Leodis Rains, NP 09/11/2022, 7:33 PM [facility]

## 2022-09-11 NOTE — Telephone Encounter (Signed)
I spoke with patient.  He reports pressure in chest on occasion over the last few weeks.  Happens with exertion such as taking out the trash or carrying in a package of water bottles.  Relieved with rest.  He has NTG but has not needed to use it.  Use of NTG reviewed with patient.  Appointment made for patient to see Robin Searing, NP tomorrow at 10:55.  Patient advised to limit exertional activities until evaluated.  ED precautions reviewed with patient.

## 2022-09-11 NOTE — Progress Notes (Signed)
   Subjective:    Patient ID: Anthony Benton., male    DOB: July 13, 1950, 72 y.o.   MRN: [id number]  HPI Here to discus his anxiety and his "panic attacks", as well as exertional chest pressure. He has been taking Valium 5 mg BID to TID for years now, and this is sufficient on most days. However he sometimes gets so anxious that he begins shaking, his heart starts pounding, and he gets mildly SOB. He tries to lie down, close his eyes, and relax when these occur, and they usually resolve after 30 minutes or so. Also he describes a pressure sensation he feels in his chest when he exerts himself, such as when he walks a short distance to his mailbox. He says this is not a pain, as such, and he does not get SOB. After he gets back to his home and sits down, the pressure goes away after about 5 minutes. He saw his cardiologist, Dr. Eldridge Dace, in January, but he was not having these chest pressure episodes at that time.    Review of Systems  Constitutional: Negative.   Respiratory:  Positive for chest tightness. Negative for cough, shortness of breath and wheezing.   Cardiovascular: Negative.   Gastrointestinal: Negative.   Psychiatric/Behavioral:  Positive for sleep disturbance. Negative for agitation, behavioral problems, confusion, decreased concentration, dysphoric mood and hallucinations. The patient is nervous/anxious.        Objective:   Physical Exam Constitutional:      General: He is not in acute distress. Cardiovascular:     Rate and Rhythm: Normal rate and regular rhythm.     Pulses: Normal pulses.     Heart sounds: Normal heart sounds.  Pulmonary:     Effort: Pulmonary effort is normal.     Breath sounds: Normal breath sounds.  Neurological:     General: No focal deficit present.     Mental Status: He is alert and oriented to person, place, and time.  Psychiatric:        Behavior: Behavior normal.        Thought Content: Thought content normal.     Comments: He is anxious as  usual            Assessment & Plan:  The Valium seems to keep his daily anxiety under control, but he still has panic attacks at times. He will try taking a Xanax 0.5 mg tablet whenever he has a panic attack. As for the exertional chest pressure, and advised him to see Dr. Eldridge Dace again asap for further evaluation. We spent a total of ( 32  ) minutes reviewing records and discussing these issues.  Gershon Crane, MD

## 2022-09-12 ENCOUNTER — Ambulatory Visit: Payer: Medicare HMO | Attending: Nurse Practitioner | Admitting: Nurse Practitioner

## 2022-09-12 ENCOUNTER — Encounter: Payer: Self-pay | Admitting: Nurse Practitioner

## 2022-09-12 ENCOUNTER — Telehealth (HOSPITAL_COMMUNITY): Payer: Self-pay | Admitting: *Deleted

## 2022-09-12 VITALS — BP 136/70 | HR 60 | Ht 69.0 in | Wt 161.2 lb

## 2022-09-12 DIAGNOSIS — K219 Gastro-esophageal reflux disease without esophagitis: Secondary | ICD-10-CM | POA: Diagnosis not present

## 2022-09-12 DIAGNOSIS — I25118 Atherosclerotic heart disease of native coronary artery with other forms of angina pectoris: Secondary | ICD-10-CM | POA: Diagnosis not present

## 2022-09-12 DIAGNOSIS — E782 Mixed hyperlipidemia: Secondary | ICD-10-CM

## 2022-09-12 DIAGNOSIS — I2089 Other forms of angina pectoris: Secondary | ICD-10-CM

## 2022-09-12 DIAGNOSIS — I10 Essential (primary) hypertension: Secondary | ICD-10-CM | POA: Diagnosis not present

## 2022-09-12 NOTE — Telephone Encounter (Signed)
Spoke with pt and gave detailed instructions for MPI study.

## 2022-09-12 NOTE — Patient Instructions (Signed)
Medication Instructions:  Your physician recommends that you continue on your current medications as directed. Please refer to the Current Medication list given to you today. *If you need a refill on your cardiac medications before your next appointment, please call your pharmacy*   Lab Work: None ordered   Testing/Procedures: Your physician has requested that you have an exercise stress myoview. For further information please visit [email or web address] Please follow instruction sheet, as given.   Follow-Up: At [facility], you and your health needs are our priority.  As part of our continuing mission to provide you with exceptional heart care, we have created designated Provider Care Teams.  These Care Teams include your primary Cardiologist (physician) and Advanced Practice Providers (APPs -  Physician Assistants and Nurse Practitioners) who all work together to provide you with the care you need, when you need it.  We recommend signing up for the patient portal called "MyChart".  Sign up information is provided on this After Visit Summary.  MyChart is used to connect with patients for Virtual Visits (Telemedicine).  Patients are able to view lab/test results, encounter notes, upcoming appointments, etc.  Non-urgent messages can be sent to your provider as well.   To learn more about what you can do with MyChart, go to [email or web address].    Your next appointment:   9 month(s)  Provider:   Lance Muss, MD     Other Instructions

## 2022-09-17 NOTE — Telephone Encounter (Signed)
Spoke with pt advised/ explained of the diagnoses in his chart, pt verbalized understanding

## 2022-09-18 ENCOUNTER — Telehealth (HOSPITAL_COMMUNITY): Payer: Self-pay | Admitting: Nurse Practitioner

## 2022-09-18 NOTE — Telephone Encounter (Signed)
Patient cancelled Myoview and will call us back when he is ready to schedule. He is going thru some personal issues and wants to wait. Order will be removed from the WQ. When patient calls back we will reinstate the order. Thank you.

## 2022-09-20 ENCOUNTER — Ambulatory Visit (HOSPITAL_COMMUNITY): Payer: Medicare HMO

## 2022-10-05 ENCOUNTER — Encounter: Payer: Self-pay | Admitting: Family Medicine

## 2022-10-05 ENCOUNTER — Ambulatory Visit (INDEPENDENT_AMBULATORY_CARE_PROVIDER_SITE_OTHER): Payer: Medicare HMO | Admitting: Family Medicine

## 2022-10-05 VITALS — BP 130/70 | HR 68 | Temp 97.5°F | Wt 157.0 lb

## 2022-10-05 DIAGNOSIS — F411 Generalized anxiety disorder: Secondary | ICD-10-CM | POA: Diagnosis not present

## 2022-10-05 DIAGNOSIS — F410 Panic disorder [episodic paroxysmal anxiety] without agoraphobia: Secondary | ICD-10-CM | POA: Diagnosis not present

## 2022-10-05 NOTE — Progress Notes (Signed)
   Subjective:    Patient ID: Anthony Benton., male    DOB: 03/26/1951, 72 y.o.   MRN: [id number]  HPI Here to follow up on anxiety and panic attacks. At our last visit we gave him some Xanax to try whenever he has a panic attack, but he has never tried them. He still takes Valium 2-3 times daily. We have spoken many times in the past about how he missed his ex-wife, but they still communicated occasionally on Facebook. However several days ago she "un-friended" him, so now he cannot communicate with her at all. This has been very stressful for him.    Review of Systems  Constitutional: Negative.   Respiratory: Negative.    Cardiovascular: Negative.   Psychiatric/Behavioral:  Positive for dysphoric mood and sleep disturbance. The patient is nervous/anxious.        Objective:   Physical Exam Constitutional:      Appearance: Normal appearance.  Cardiovascular:     Rate and Rhythm: Normal rate and regular rhythm.     Pulses: Normal pulses.     Heart sounds: Normal heart sounds.  Pulmonary:     Effort: Pulmonary effort is normal.     Breath sounds: Normal breath sounds.  Neurological:     Mental Status: He is alert.  Psychiatric:     Comments: He is very anxious            Assessment & Plan:  Anxiety with panic attacks. I once again urged him to use the Xanax whenever he has a panic attack like he did the other day. He assured me he will try them next time.  Gershon Crane, MD

## 2022-11-02 ENCOUNTER — Other Ambulatory Visit: Payer: Self-pay | Admitting: Family Medicine

## 2023-01-09 DIAGNOSIS — M545 Low back pain, unspecified: Secondary | ICD-10-CM | POA: Diagnosis not present

## 2023-01-29 ENCOUNTER — Other Ambulatory Visit: Payer: Self-pay | Admitting: Family Medicine

## 2023-01-29 ENCOUNTER — Ambulatory Visit (INDEPENDENT_AMBULATORY_CARE_PROVIDER_SITE_OTHER): Payer: Medicare HMO | Admitting: Family Medicine

## 2023-01-29 ENCOUNTER — Encounter: Payer: Self-pay | Admitting: Family Medicine

## 2023-01-29 VITALS — BP 130/76 | HR 68 | Temp 98.1°F | Wt 166.0 lb

## 2023-01-29 DIAGNOSIS — E538 Deficiency of other specified B group vitamins: Secondary | ICD-10-CM | POA: Diagnosis not present

## 2023-01-29 DIAGNOSIS — M549 Dorsalgia, unspecified: Secondary | ICD-10-CM | POA: Diagnosis not present

## 2023-01-29 DIAGNOSIS — E782 Mixed hyperlipidemia: Secondary | ICD-10-CM

## 2023-01-29 DIAGNOSIS — R739 Hyperglycemia, unspecified: Secondary | ICD-10-CM

## 2023-01-29 DIAGNOSIS — R200 Anesthesia of skin: Secondary | ICD-10-CM

## 2023-01-29 DIAGNOSIS — M79622 Pain in left upper arm: Secondary | ICD-10-CM | POA: Diagnosis not present

## 2023-01-29 LAB — POC URINALSYSI DIPSTICK (AUTOMATED)
Bilirubin, UA: NEGATIVE
Blood, UA: NEGATIVE
Glucose, UA: NEGATIVE
Ketones, UA: NEGATIVE
Leukocytes, UA: NEGATIVE
Nitrite, UA: NEGATIVE
Protein, UA: NEGATIVE
Spec Grav, UA: <=1.005 — AB (ref 1.010–1.025)
Urobilinogen, UA: 0.2 U/dL
pH, UA: 7.5 (ref 5.0–8.0)

## 2023-01-29 MED ORDER — METHYLPREDNISOLONE ACETATE 80 MG/ML IJ SUSP
80.0000 mg | Freq: Once | INTRAMUSCULAR | Status: AC
Start: 2023-01-29 — End: 2023-01-29
  Administered 2023-01-29: 80 mg via INTRAMUSCULAR

## 2023-01-29 MED ORDER — METHYLPREDNISOLONE ACETATE 40 MG/ML IJ SUSP
40.0000 mg | Freq: Once | INTRAMUSCULAR | Status: AC
Start: 2023-01-29 — End: 2023-01-29
  Administered 2023-01-29: 40 mg via INTRAMUSCULAR

## 2023-01-29 NOTE — Addendum Note (Signed)
Addended by: Carola Rhine on: 01/29/2023 04:16 PM   Modules accepted: Orders

## 2023-01-29 NOTE — Addendum Note (Signed)
Addended by: Carola Rhine on: 01/29/2023 04:06 PM   Modules accepted: Orders

## 2023-01-29 NOTE — Progress Notes (Signed)
   Subjective:    Patient ID: Anthony Better., male    DOB: [birth year], 72 y.o.   MRN: [id number]  HPI Here for several symptoms that he thinks are side effects of Toradol. He sees Dr. Margreta Journey for orthopedic issues, and he usually gives Anthony Benton a cortisone shot. However when saw Anthony Benton on 01-09-23, he gave him a Toradol shot instead. Anthony Benton says ever since then he has had an intermittent stinging pain in the left upper arm, and his hands go numb at night in bed. The numbness goes away after he gets up. He says he just has not "felt well" since the shot. He asks if he can have a steroid shot today. He denies any neck pain.    Review of Systems  Constitutional: Negative.   Respiratory: Negative.    Cardiovascular: Negative.   Musculoskeletal:  Positive for back pain.  Neurological:  Positive for numbness.       Objective:   Physical Exam Constitutional:      General: He is not in acute distress.    Appearance: Normal appearance.  Cardiovascular:     Rate and Rhythm: Normal rate and regular rhythm.     Pulses: Normal pulses.     Heart sounds: Normal heart sounds.  Pulmonary:     Effort: Pulmonary effort is normal.     Breath sounds: Normal breath sounds.  Musculoskeletal:     Comments: The cervical spine is normal with full ROM and no tenderness. The left shoulder is normal. The left upper arm is not tender or swollen   Neurological:     Mental Status: He is alert.           Assessment & Plan:  Possible side effects of a Toradol shot. I am doubtful there is a relation between these tow, but if so they should resolve soon. We will give him a shot of DepoMedrol. Get fasting labs soon. Gershon Crane, MD

## 2023-01-30 ENCOUNTER — Other Ambulatory Visit (INDEPENDENT_AMBULATORY_CARE_PROVIDER_SITE_OTHER): Payer: Medicare HMO

## 2023-01-30 ENCOUNTER — Telehealth: Payer: Self-pay | Admitting: Family Medicine

## 2023-01-30 DIAGNOSIS — R739 Hyperglycemia, unspecified: Secondary | ICD-10-CM | POA: Diagnosis not present

## 2023-01-30 DIAGNOSIS — E538 Deficiency of other specified B group vitamins: Secondary | ICD-10-CM | POA: Diagnosis not present

## 2023-01-30 DIAGNOSIS — E782 Mixed hyperlipidemia: Secondary | ICD-10-CM

## 2023-01-30 LAB — BASIC METABOLIC PANEL
BUN: 10 mg/dL (ref 6–23)
CO2: 26 meq/L (ref 19–32)
Calcium: 10.2 mg/dL (ref 8.4–10.5)
Chloride: 105 meq/L (ref 96–112)
Creatinine, Ser: 0.84 mg/dL (ref 0.40–1.50)
GFR: 86.86 mL/min (ref >60.00)
Glucose, Bld: 100 mg/dL — ABNORMAL HIGH (ref 70–99)
Potassium: 4.3 meq/L (ref 3.5–5.1)
Sodium: 139 meq/L (ref 135–145)

## 2023-01-30 LAB — HEPATIC FUNCTION PANEL
ALT: 22 U/L (ref 0–53)
AST: 21 U/L (ref 0–37)
Albumin: 4.3 g/dL (ref 3.5–5.2)
Alkaline Phosphatase: 83 U/L (ref 39–117)
Bilirubin, Direct: 0.1 mg/dL (ref 0.0–0.3)
Total Bilirubin: 0.7 mg/dL (ref 0.2–1.2)
Total Protein: 7.2 g/dL (ref 6.0–8.3)

## 2023-01-30 LAB — CBC WITH DIFFERENTIAL/PLATELET
Basophils Absolute: 0 10*3/uL (ref 0.0–0.1)
Basophils Relative: 0.2 % (ref 0.0–3.0)
Eosinophils Absolute: 0 10*3/uL (ref 0.0–0.7)
Eosinophils Relative: 0.2 % (ref 0.0–5.0)
HCT: 43.8 % (ref 39.0–52.0)
Hemoglobin: 14.5 g/dL (ref 13.0–17.0)
Lymphocytes Relative: 16.5 % (ref 12.0–46.0)
Lymphs Abs: 1.7 10*3/uL (ref 0.7–4.0)
MCHC: 33.1 g/dL (ref 30.0–36.0)
MCV: 94.8 fl (ref 78.0–100.0)
Monocytes Absolute: 0.6 10*3/uL (ref 0.1–1.0)
Monocytes Relative: 6 % (ref 3.0–12.0)
Neutro Abs: 8.2 10*3/uL — ABNORMAL HIGH (ref 1.4–7.7)
Neutrophils Relative %: 77.1 % — ABNORMAL HIGH (ref 43.0–77.0)
Platelets: 228 10*3/uL (ref 150.0–400.0)
RBC: 4.62 Mil/uL (ref 4.22–5.81)
RDW: 13.4 % (ref 11.5–15.5)
WBC: 10.6 10*3/uL — ABNORMAL HIGH (ref 4.0–10.5)

## 2023-01-30 LAB — LIPID PANEL
Cholesterol: 188 mg/dL (ref 0–200)
HDL: 52.2 mg/dL (ref >39.00)
LDL Cholesterol: 108 mg/dL — ABNORMAL HIGH (ref 0–99)
NonHDL: 136.26
Total CHOL/HDL Ratio: 4
Triglycerides: 139 mg/dL (ref 0.0–149.0)
VLDL: 27.8 mg/dL (ref 0.0–40.0)

## 2023-01-30 LAB — VITAMIN B12: Vitamin B-12: 236 pg/mL (ref 211–911)

## 2023-01-30 LAB — HEMOGLOBIN A1C: Hgb A1c MFr Bld: 5.9 % (ref 4.6–6.5)

## 2023-01-30 LAB — TSH: TSH: 1.17 u[IU]/mL (ref 0.35–5.50)

## 2023-01-30 NOTE — Telephone Encounter (Signed)
Pt states the shot that Dr. Clent Ridges gave him yesterday has already made him feel about 40% better.  Pt wanted me to let Harriett Sine know.

## 2023-01-31 NOTE — Telephone Encounter (Signed)
Noted  

## 2023-02-06 ENCOUNTER — Telehealth: Payer: Self-pay | Admitting: Family Medicine

## 2023-02-06 NOTE — Telephone Encounter (Signed)
Pt is calling and would like blood work results from 01-30-2023

## 2023-02-07 NOTE — Telephone Encounter (Signed)
Pt given lab results

## 2023-02-16 DIAGNOSIS — M545 Low back pain, unspecified: Secondary | ICD-10-CM | POA: Diagnosis not present

## 2023-02-25 ENCOUNTER — Telehealth: Payer: Self-pay | Admitting: Family Medicine

## 2023-02-25 MED ORDER — CYCLOBENZAPRINE HCL 5 MG PO TABS: 5.0000 mg | ORAL_TABLET | Freq: Three times a day (TID) | ORAL | 2 refills | Status: DC | PRN

## 2023-02-25 NOTE — Telephone Encounter (Signed)
Cancel the 10 mg size and call in Flexeril 5 mg to use every 8 hours as needed for muscle spasms, #90 with 2 rf

## 2023-02-25 NOTE — Telephone Encounter (Signed)
Rx sent with note to cancel the 10 mg Rx.

## 2023-02-25 NOTE — Telephone Encounter (Signed)
Pt requesting cyclobenzaprine (FLEXERIL) 5 MG tablet says the 10mg  are too strong and when he tries to cut it in half they shatter into pieces.   [id number] [place], [place] - [address] Phone: [phone]  Fax: [phone]

## 2023-02-25 NOTE — Addendum Note (Signed)
Addended by: Weyman Croon E on: 02/25/2023 04:10 PM   Modules accepted: Orders

## 2023-03-26 ENCOUNTER — Other Ambulatory Visit: Payer: Self-pay | Admitting: Family Medicine

## 2023-03-26 NOTE — Telephone Encounter (Signed)
Pt LOV was on 01/29/23 Last refill was done on 09/11/22 Please advise

## 2023-04-08 ENCOUNTER — Ambulatory Visit (INDEPENDENT_AMBULATORY_CARE_PROVIDER_SITE_OTHER): Payer: Medicare HMO | Admitting: Family Medicine

## 2023-04-08 ENCOUNTER — Encounter: Payer: Self-pay | Admitting: Family Medicine

## 2023-04-08 VITALS — BP 120/60 | HR 75 | Temp 98.2°F | Wt 164.0 lb

## 2023-04-08 DIAGNOSIS — R319 Hematuria, unspecified: Secondary | ICD-10-CM | POA: Diagnosis not present

## 2023-04-08 DIAGNOSIS — N390 Urinary tract infection, site not specified: Secondary | ICD-10-CM

## 2023-04-08 LAB — POC URINALSYSI DIPSTICK (AUTOMATED)
Bilirubin, UA: NEGATIVE
Glucose, UA: NEGATIVE
Ketones, UA: NEGATIVE
Leukocytes, UA: NEGATIVE
Nitrite, UA: NEGATIVE
Protein, UA: NEGATIVE
Spec Grav, UA: 1.01 (ref 1.010–1.025)
Urobilinogen, UA: 0.2 U/dL
pH, UA: 6.5 (ref 5.0–8.0)

## 2023-04-08 MED ORDER — CEFUROXIME AXETIL 500 MG PO TABS: 500.0000 mg | ORAL_TABLET | Freq: Two times a day (BID) | ORAL | 0 refills | Status: DC

## 2023-04-08 NOTE — Progress Notes (Signed)
   Subjective:    Patient ID: Anthony Better., male    DOB: April 25, 1951, 72 y.o.   MRN: [id number]  HPI Here to check his urine. In the past few weeks he has passed several kidney stones, and he still has some slight discomfort when urinating. No fever.    Review of Systems  Constitutional: Negative.   Respiratory: Negative.    Cardiovascular: Negative.   Gastrointestinal: Negative.   Genitourinary:  Positive for dysuria, frequency and hematuria.  Musculoskeletal:  Positive for back pain.       Objective:   Physical Exam Constitutional:      Appearance: Normal appearance.  Cardiovascular:     Rate and Rhythm: Normal rate and regular rhythm.     Pulses: Normal pulses.     Heart sounds: Normal heart sounds.  Pulmonary:     Effort: Pulmonary effort is normal.     Breath sounds: Normal breath sounds.  Abdominal:     General: Abdomen is flat. Bowel sounds are normal. There is no distension.     Palpations: Abdomen is soft. There is no mass.     Tenderness: There is no abdominal tenderness. There is no right CVA tenderness, left CVA tenderness, guarding or rebound.     Hernia: No hernia is present.  Neurological:     Mental Status: He is alert.           Assessment & Plan:  UTI, treat with 7 days of Cefuroxime. Drink plenty of water.  Gershon Crane, MD

## 2023-04-08 NOTE — Addendum Note (Signed)
Addended by: Carola Rhine on: 04/08/2023 03:32 PM   Modules accepted: Orders

## 2023-04-10 ENCOUNTER — Telehealth: Payer: Self-pay | Admitting: Family Medicine

## 2023-04-10 NOTE — Telephone Encounter (Signed)
Stop the Cefuroxime and call in a Zpack

## 2023-04-10 NOTE — Telephone Encounter (Signed)
Pt called to say the cefUROXime (CEFTIN) 500 MG tablet is upsetting his stomach and he cannot take it. Pt would like to know if he can stop taking it and perhaps MD can send him something else. Pt stated he needs a call back within the hour. Pt was informed MD & CMA were currently with Pts, but CMA would call back at her earliest convenience.

## 2023-04-11 ENCOUNTER — Other Ambulatory Visit: Payer: Self-pay

## 2023-04-11 MED ORDER — AZITHROMYCIN 250 MG PO TABS: ORAL_TABLET | ORAL | 0 refills | Status: AC

## 2023-04-11 NOTE — Telephone Encounter (Signed)
Zpack Rx was sent to pt pharmacy. Pt aware

## 2023-04-11 NOTE — Telephone Encounter (Signed)
Pt call and stated he want you to give him a call back and let him know it the zpack is for the UTI because dr. Clent Ridges told him is was for a upper respiratory.He stated will you please call him back today.

## 2023-04-15 NOTE — Telephone Encounter (Signed)
Spoke with pt advised that Zpack was for URI

## 2023-06-04 ENCOUNTER — Ambulatory Visit: Payer: Medicare HMO | Attending: Cardiology | Admitting: Cardiology

## 2023-06-04 ENCOUNTER — Encounter: Payer: Self-pay | Admitting: Cardiology

## 2023-06-04 VITALS — BP 120/70 | HR 73 | Ht 69.0 in | Wt 168.8 lb

## 2023-06-04 DIAGNOSIS — I25118 Atherosclerotic heart disease of native coronary artery with other forms of angina pectoris: Secondary | ICD-10-CM | POA: Diagnosis not present

## 2023-06-04 DIAGNOSIS — I10 Essential (primary) hypertension: Secondary | ICD-10-CM

## 2023-06-04 NOTE — Patient Instructions (Signed)
 Medication Instructions:  Your physician recommends that you continue on your current medications as directed. Please refer to the Current Medication list given to you today.  *If you need a refill on your cardiac medications before your next appointment, please call your pharmacy*  Lab Work: None ordered today. If you have labs (blood work) drawn today and your tests are completely normal, you will receive your results only by: MyChart Message (if you have MyChart) OR A paper copy in the mail If you have any lab test that is abnormal or we need to change your treatment, we will call you to review the results.  Testing/Procedures: None ordered today.  Follow-Up: At [facility], you and your health needs are our priority.  As part of our continuing mission to provide you with exceptional heart care, we have created designated Provider Care Teams.  These Care Teams include your primary Cardiologist (physician) and Advanced Practice Providers (APPs -  Physician Assistants and Nurse Practitioners) who all work together to provide you with the care you need, when you need it.  We recommend signing up for the patient portal called MyChart.  Sign up information is provided on this After Visit Summary.  MyChart is used to connect with patients for Virtual Visits (Telemedicine).  Patients are able to view lab/test results, encounter notes, upcoming appointments, etc.  Non-urgent messages can be sent to your provider as well.   To learn more about what you can do with MyChart, go to [email or web address].    Your next appointment:   As needed   The format for your next appointment:   In Person  Provider:   Oneil Parchment, MD {

## 2023-06-04 NOTE — Progress Notes (Signed)
 Cardiology Office Note:  .   Date:  06/04/2023  ID:  Anthony MALVA Lorren Mickey., DOB [birth year], MRN [id number] PCP: Johnny Garnette LABOR, MD   HeartCare Providers Cardiologist:  Oneil Parchment, MD     History of Present Illness: .   Anthony Starace. is a 72 y.o. male Discussed with the use of AI scribe   History of Present Illness   The patient is a 73 year old male with a history of coronary artery disease, hypertension, and hyperlipidemia. In 2012, he underwent cardiac catheterization resulting in the placement of two drug-eluting stents in the RCA. The patient was airlifted to the hospital at the time but did not experience a myocardial infarction. The initial symptoms were exercise-induced, occurring during walks along his long driveway.  The patient has a family history of high cholesterol and a sister who passed away from bone cancer in 2018. He is actively involved in his church and experienced a stressful separation from his wife in 2019, which led to a period of staying with various church members.  The patient is a former smoker and has been experiencing emotional stress and sadness related to his separation. He has been living alone and reports feelings of loneliness, particularly during the winter months. He has experienced panic attacks in the past six months, which he attributes to his emotional state.  The patient is compliant with his medication regimen, which includes daily aspirin, atorvastatin , and lisinopril . He also takes another unidentified 2.5mg  pill. He reports taking his medications consistently around the same time each day.  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 Despite this, his EKG results have been stable and he has been managing his health well. He has been seeing Dr. Johnny for ongoing care.          ROS: No CP, no SOB  Studies  Reviewed: SABRA   EKG Interpretation Date/Time:  Tuesday June 04 2023 13:35:05 EST Ventricular Rate:  73 PR Interval:  154 QRS Duration:  114 QT Interval:  400 QTC Calculation: 440 R Axis:   -25  Text Interpretation: Sinus rhythm with occasional Premature ventricular complexes and Premature atrial complexes Right bundle branch block Minimal voltage criteria for LVH, may be normal variant ( R in aVL ) Inferior infarct , age undetermined When compared with ECG of 03-Aug-1998 22:05, Premature ventricular complexes are now Present Premature atrial complexes are now Present Right bundle branch block is now Present Inferior infarct is now Present Confirmed by Parchment Oneil ([id number]) on 06/04/2023 1:36:06 PM    Results   DIAGNOSTIC Cardiac catheterization: Two stents to the RCA, 3.5 by 15 mm Zients and 3.5 by 8 mm Zients (02/2011) EKG: Normal LDL 108     Risk Assessment/Calculations:            Physical Exam:   VS:  BP 120/70   Pulse 73   Ht 5' 9 (1.753 m)   Wt 168 lb 12.8 oz (76.6 kg)   SpO2 96%   BMI 24.93 kg/m    Wt Readings from Last 3 Encounters:  06/04/23 168 lb 12.8 oz (76.6 kg)  04/08/23 164 lb (74.4 kg)  01/29/23 166 lb (75.3 kg)    GEN: Well nourished, well developed in no acute distress, occasional stutter NECK: No JVD; No carotid bruits CARDIAC: RRR, no murmurs, no rubs, no gallops RESPIRATORY:  Clear to auscultation  without rales, wheezing or rhonchi  ABDOMEN: Soft, non-tender, non-distended EXTREMITIES:  No edema; No deformity   ASSESSMENT AND PLAN: .    Assessment and Plan    Coronary Artery Disease (CAD) 73 year old with CAD, status post drug-eluting stent to RCA in 2012. No recent cardiac events or symptoms. EKG is stable. Compliant with aspirin 81 mg daily, atorvastatin , and lisinopril . Discussed the importance of continuing medications to prevent future cardiac events. Patient understands the risks of discontinuing medication, including increased risk of  myocardial infarction and stroke. - Continue aspirin 81 mg daily, atorvastatin  80, lisinopril  40.  - PRN follow-up if symptoms arise or as directed by primary care physician, Dr. Johnny  Hypertension Well-controlled with lisinopril . Discussed the importance of maintaining blood pressure control to reduce cardiovascular risk. - Continue lisinopril  - Monitor blood pressure regularly  Hyperlipidemia Managed with atorvastatin . Discussed the importance of lipid control in preventing cardiovascular events. - Continue atorvastatin  - Goal LDL < 70  General Health Maintenance Former smoker, actively involved in church, history of emotional stress and panic attacks. No current symptoms of chest pain or other acute issues. Discussed the importance of social support and mental health in overall well-being. - Encourage continued involvement in social activities and church - Monitor mental health and emotional well-being - PRN follow-up for any new symptoms or concerns  Follow-up - PRN follow-up with cardiology if symptoms arise or as directed by Dr. Johnny - Provide cardiologist's contact information for any future concerns.               Signed, Oneil Parchment, MD

## 2023-06-26 ENCOUNTER — Other Ambulatory Visit: Payer: Self-pay | Admitting: Interventional Cardiology

## 2023-07-20 ENCOUNTER — Other Ambulatory Visit: Payer: Self-pay | Admitting: Family Medicine

## 2023-08-12 ENCOUNTER — Encounter: Payer: Self-pay | Admitting: Family Medicine

## 2023-08-12 ENCOUNTER — Ambulatory Visit (INDEPENDENT_AMBULATORY_CARE_PROVIDER_SITE_OTHER): Admitting: Family Medicine

## 2023-08-12 VITALS — BP 140/80 | HR 71 | Temp 98.0°F | Wt 169.2 lb

## 2023-08-12 DIAGNOSIS — R531 Weakness: Secondary | ICD-10-CM | POA: Diagnosis not present

## 2023-08-12 DIAGNOSIS — B354 Tinea corporis: Secondary | ICD-10-CM

## 2023-08-12 LAB — CBC WITH DIFFERENTIAL/PLATELET
Basophils Absolute: 0 10*3/uL (ref 0.0–0.1)
Basophils Relative: 0.4 % (ref 0.0–3.0)
Eosinophils Absolute: 0.1 10*3/uL (ref 0.0–0.7)
Eosinophils Relative: 1.1 % (ref 0.0–5.0)
HCT: 42 % (ref 39.0–52.0)
Hemoglobin: 14.3 g/dL (ref 13.0–17.0)
Lymphocytes Relative: 23.3 % (ref 12.0–46.0)
Lymphs Abs: 1.9 10*3/uL (ref 0.7–4.0)
MCHC: 34.1 g/dL (ref 30.0–36.0)
MCV: 93.2 fl (ref 78.0–100.0)
Monocytes Absolute: 0.8 10*3/uL (ref 0.1–1.0)
Monocytes Relative: 9.1 % (ref 3.0–12.0)
Neutro Abs: 5.5 10*3/uL (ref 1.4–7.7)
Neutrophils Relative %: 66.1 % (ref 43.0–77.0)
Platelets: 222 10*3/uL (ref 150.0–400.0)
RBC: 4.51 Mil/uL (ref 4.22–5.81)
RDW: 12.6 % (ref 11.5–15.5)
WBC: 8.4 10*3/uL (ref 4.0–10.5)

## 2023-08-12 LAB — POC URINALSYSI DIPSTICK (AUTOMATED)
Bilirubin, UA: NEGATIVE
Blood, UA: NEGATIVE
Glucose, UA: NEGATIVE
Ketones, UA: NEGATIVE
Leukocytes, UA: NEGATIVE
Nitrite, UA: NEGATIVE
Protein, UA: NEGATIVE
Spec Grav, UA: <=1.005 — AB (ref 1.010–1.025)
Urobilinogen, UA: 0.2 U/dL
pH, UA: 7 (ref 5.0–8.0)

## 2023-08-12 LAB — BASIC METABOLIC PANEL
BUN: 9 mg/dL (ref 6–23)
CO2: 26 meq/L (ref 19–32)
Calcium: 10.2 mg/dL (ref 8.4–10.5)
Chloride: 106 meq/L (ref 96–112)
Creatinine, Ser: 0.8 mg/dL (ref 0.40–1.50)
GFR: 87.82 mL/min (ref >60.00)
Glucose, Bld: 88 mg/dL (ref 70–99)
Potassium: 3.8 meq/L (ref 3.5–5.1)
Sodium: 141 meq/L (ref 135–145)

## 2023-08-12 LAB — HEPATIC FUNCTION PANEL
ALT: 17 U/L (ref 0–53)
AST: 17 U/L (ref 0–37)
Albumin: 4.6 g/dL (ref 3.5–5.2)
Alkaline Phosphatase: 83 U/L (ref 39–117)
Bilirubin, Direct: 0.1 mg/dL (ref 0.0–0.3)
Total Bilirubin: 0.6 mg/dL (ref 0.2–1.2)
Total Protein: 7.4 g/dL (ref 6.0–8.3)

## 2023-08-12 LAB — VITAMIN B12: Vitamin B-12: 211 pg/mL (ref 211–911)

## 2023-08-12 LAB — HEMOGLOBIN A1C: Hgb A1c MFr Bld: 6.1 % (ref 4.6–6.5)

## 2023-08-12 LAB — TSH: TSH: 1.11 u[IU]/mL (ref 0.35–5.50)

## 2023-08-12 NOTE — Progress Notes (Signed)
   Subjective:    Patient ID: Anthony Benton., male    DOB: September 16, 1950, 73 y.o.   MRN: [id number]  HPI Here for several symptoms. For the past 6 weeks he has felt "like something is wrong" in his body. He feels weak all over, and his legs feel heavy. No SOB. No urinary symptoms. He also has an itchy rash on the abdomen that appeared 2 weeks ago. This appears to be spreading. He saw his cardiologist, Dr. Anne Fu, in January, and he got got a clean bill of health.    Review of Systems  Constitutional:  Positive for fatigue.  Respiratory: Negative.    Cardiovascular: Negative.   Skin:  Positive for rash.  Neurological:  Positive for weakness.       Objective:   Physical Exam Constitutional:      Appearance: Normal appearance.  Cardiovascular:     Rate and Rhythm: Normal rate and regular rhythm.     Pulses: Normal pulses.     Heart sounds: Normal heart sounds.  Pulmonary:     Effort: Pulmonary effort is normal.     Breath sounds: Normal breath sounds.  Abdominal:     General: Abdomen is flat. Bowel sounds are normal. There is no distension.     Palpations: Abdomen is soft. There is no mass.     Tenderness: There is no abdominal tenderness. There is no guarding or rebound.     Hernia: No hernia is present.  Musculoskeletal:     Right lower leg: No edema.     Left lower leg: No edema.  Skin:    Comments: There is a patch of slightly raised pink skin on the LLQ   Neurological:     Mental Status: He is alert and oriented to person, place, and time. Mental status is at baseline.           Assessment & Plan:  The etiology of his weakness is not clear, so we will send him to have labs drawn. The rash is due to a ringworm, so we will treat with Ketoconazole cream.  Gershon Crane, MD

## 2023-08-13 ENCOUNTER — Telehealth: Payer: Self-pay

## 2023-08-13 NOTE — Telephone Encounter (Signed)
 Copied from CRM [id number]. Topic: Clinical - Prescription Issue >> Aug 12, 2023  4:55 PM Efraim Kaufmann C wrote: Reason for CRM: patient saw Dr. Clent Ridges today and he said he usually [facility] day [facility] ready but he hasn't received any message. He didn't know the name but he said that Dr. Clent Ridges was supposed to prescribe an antibacterial cream for him for his rash and he said it could have slipped his mind but he was waiting on the [facility] from [facility] and haven't received any notification. Please advise. Thank you

## 2023-08-13 NOTE — Telephone Encounter (Signed)
 Ok to send Ketoconazole cream for pt from the [facility] on 08/12/23 Please advise

## 2023-08-13 NOTE — Telephone Encounter (Signed)
 Spoke with pt advised that a message was sent to Dr Clent Ridges for approval. Pt verbalized understanding that Dr Clent Ridges is out of the office

## 2023-08-13 NOTE — Telephone Encounter (Signed)
 Copied from CRM [id number]. Topic: General - Call Back - No Documentation >> Aug 13, 2023 10:09 AM Lennart Pall wrote: Reason for CRM: patient said he received a call from [place]..would like a call back as soon as possible

## 2023-08-16 ENCOUNTER — Telehealth: Payer: Self-pay

## 2023-08-16 NOTE — Telephone Encounter (Signed)
 Copied from CRM [id number]. Topic: Clinical - Medication Question >> Aug 16, 2023  4:05 PM Elizebeth Brooking wrote: Reason for CRM: Patient would like to speak to Dr.Fry Nurse Harriett Sine , Was there Monday stated he has not received his medication from Monday would like that Ketoconazole cream to be sent in to the pharmacy

## 2023-08-19 ENCOUNTER — Telehealth: Payer: Self-pay

## 2023-08-19 MED ORDER — KETOCONAZOLE 2 % EX CREA: 1.0000 | TOPICAL_CREAM | Freq: Two times a day (BID) | CUTANEOUS | 5 refills | Status: AC | PRN

## 2023-08-19 NOTE — Telephone Encounter (Signed)
I sent in the cream  

## 2023-08-19 NOTE — Addendum Note (Signed)
 Addended by: Gershon Crane A on: 08/19/2023 12:23 PM   Modules accepted: Orders

## 2023-08-19 NOTE — Telephone Encounter (Signed)
 Copied from CRM [id number]. Topic: General - Other >> Aug 19, 2023  2:25 PM Adaysia C wrote: Reason for CRM: Patient received a missed call from the provider; patient has requested a call back; please follow up with patient #[phone]

## 2023-08-19 NOTE — Telephone Encounter (Signed)
 Copied from CRM [id number]. Topic: Clinical - Prescription Issue >> Aug 19, 2023  3:44 PM Saverio Danker wrote: Reason for CRM: Patient states he still hasn't received a call about his medication from previous appointment. Patient received a missed call from the provider; patient has requested a call back; please follow up with patient #[phone]

## 2023-08-20 ENCOUNTER — Telehealth: Payer: Self-pay

## 2023-08-20 ENCOUNTER — Ambulatory Visit (INDEPENDENT_AMBULATORY_CARE_PROVIDER_SITE_OTHER)

## 2023-08-20 DIAGNOSIS — E538 Deficiency of other specified B group vitamins: Secondary | ICD-10-CM

## 2023-08-20 MED ORDER — CYANOCOBALAMIN 1000 MCG/ML IJ SOLN
1000.0000 ug | Freq: Once | INTRAMUSCULAR | Status: AC
  Administered 2023-08-20: 1000 ug via INTRAMUSCULAR

## 2023-08-20 NOTE — Telephone Encounter (Signed)
 Copied from CRM [id number]. Topic: Clinical - Medical Advice >> Aug 20, 2023 10:27 AM Adaysia C wrote: Reason for CRM: Patient stated he was told he would receive a call back from [place] about setting up his weekly B12 injections and other health concerns; patient has requested a call back; please follow up with patient #[phone]

## 2023-08-20 NOTE — Progress Notes (Signed)
 Pt here for monthly B12 injection per Dr Clent Ridges  B12 given IM and pt tolerated injection well.  Next B12 injection scheduled for 08/28/23

## 2023-08-21 NOTE — Telephone Encounter (Signed)
 Spoke with pt states that he has picked up Rx and started using it

## 2023-08-21 NOTE — Telephone Encounter (Signed)
 Duplicate encounter

## 2023-08-21 NOTE — Telephone Encounter (Signed)
 Encounter resolved, nothing further needed

## 2023-08-21 NOTE — Telephone Encounter (Signed)
 Pt already started his 1st B12 injection on 08/20/23

## 2023-08-21 NOTE — Telephone Encounter (Signed)
 Duplicate message.

## 2023-08-22 DIAGNOSIS — L570 Actinic keratosis: Secondary | ICD-10-CM | POA: Diagnosis not present

## 2023-08-22 DIAGNOSIS — L308 Other specified dermatitis: Secondary | ICD-10-CM | POA: Diagnosis not present

## 2023-08-22 DIAGNOSIS — D1801 Hemangioma of skin and subcutaneous tissue: Secondary | ICD-10-CM | POA: Diagnosis not present

## 2023-08-22 DIAGNOSIS — Z85828 Personal history of other malignant neoplasm of skin: Secondary | ICD-10-CM | POA: Diagnosis not present

## 2023-08-22 DIAGNOSIS — L309 Dermatitis, unspecified: Secondary | ICD-10-CM | POA: Diagnosis not present

## 2023-08-28 ENCOUNTER — Ambulatory Visit (INDEPENDENT_AMBULATORY_CARE_PROVIDER_SITE_OTHER)

## 2023-08-28 DIAGNOSIS — E538 Deficiency of other specified B group vitamins: Secondary | ICD-10-CM | POA: Diagnosis not present

## 2023-08-28 MED ORDER — CYANOCOBALAMIN 1000 MCG/ML IJ SOLN
1000.0000 ug | Freq: Once | INTRAMUSCULAR | Status: AC
  Administered 2023-08-28: 1000 ug via INTRAMUSCULAR

## 2023-08-28 NOTE — Progress Notes (Signed)
 Patient is in office today for a nurse visit for B12 Injection. Patient Injection was given in the  Right deltoid. Patient tolerated injection well.

## 2023-09-04 ENCOUNTER — Ambulatory Visit

## 2023-09-04 DIAGNOSIS — E538 Deficiency of other specified B group vitamins: Secondary | ICD-10-CM | POA: Diagnosis not present

## 2023-09-04 MED ORDER — CYANOCOBALAMIN 1000 MCG/ML IJ SOLN
1000.0000 ug | Freq: Once | INTRAMUSCULAR | Status: AC
  Administered 2023-09-04: 1000 ug via INTRAMUSCULAR

## 2023-09-04 NOTE — Progress Notes (Signed)
  Per orders of Donley Furth, MD, injection of B12 given in left deltoid by Cosimo Diones. Patient tolerated injection well.  Lab Results  Component Value Date   VITAMINB12 211 08/12/2023

## 2023-09-04 NOTE — Patient Instructions (Signed)
 Health Maintenance Due  Topic Date Due   Hepatitis C Screening  Never done   DTaP/Tdap/Td (1 - Tdap) Never done   Pneumonia Vaccine 59+ Years old (1 of 2 - PCV) Never done   Colonoscopy  Never done   Zoster Vaccines- Shingrix (1 of 2) Never done   Medicare Annual Wellness (AWV)  11/09/2018   COVID-19 Vaccine (1 - 2024-25 season) Never done       07/30/2022    3:57 PM 03/12/2022    1:41 PM 02/12/2022    2:19 PM  Depression screen PHQ 2/9  Decreased Interest 0 0 0  Down, Depressed, Hopeless 0 0 0  PHQ - 2 Score 0 0 0  Altered sleeping  0 0  Tired, decreased energy  1 0  Change in appetite  0 0  Feeling bad or failure about yourself   0 0  Trouble concentrating  0 0  Moving slowly or fidgety/restless  0 0  Suicidal thoughts  0 0  PHQ-9 Score  1 0  Difficult doing work/chores  Not difficult at all Not difficult at all

## 2023-09-11 ENCOUNTER — Ambulatory Visit (INDEPENDENT_AMBULATORY_CARE_PROVIDER_SITE_OTHER)

## 2023-09-11 DIAGNOSIS — E538 Deficiency of other specified B group vitamins: Secondary | ICD-10-CM

## 2023-09-11 MED ORDER — CYANOCOBALAMIN 1000 MCG/ML IJ SOLN
1000.0000 ug | Freq: Once | INTRAMUSCULAR | Status: AC
  Administered 2023-09-11: 1000 ug via INTRAMUSCULAR

## 2023-09-11 NOTE — Progress Notes (Signed)
 Pt here for monthly B12 injection per Dr Alyne Babinski  B12 1000mcg given IM and pt tolerated injection well.  Next B12 injection scheduled for 09/18/23

## 2023-09-18 ENCOUNTER — Ambulatory Visit (INDEPENDENT_AMBULATORY_CARE_PROVIDER_SITE_OTHER)

## 2023-09-18 DIAGNOSIS — E538 Deficiency of other specified B group vitamins: Secondary | ICD-10-CM | POA: Diagnosis not present

## 2023-09-18 MED ORDER — CYANOCOBALAMIN 1000 MCG/ML IJ SOLN
1000.0000 ug | Freq: Once | INTRAMUSCULAR | Status: AC
  Administered 2023-09-18: 1000 ug via INTRAMUSCULAR

## 2023-09-18 NOTE — Progress Notes (Signed)
 Patient is in office today for a nurse visit for B12 Injection. Patient Injection was given in the  Left deltoid. Patient tolerated injection well.

## 2023-09-25 ENCOUNTER — Ambulatory Visit: Admitting: *Deleted

## 2023-09-25 DIAGNOSIS — E538 Deficiency of other specified B group vitamins: Secondary | ICD-10-CM | POA: Diagnosis not present

## 2023-09-25 MED ORDER — CYANOCOBALAMIN 1000 MCG/ML IJ SOLN
1000.0000 ug | Freq: Once | INTRAMUSCULAR | Status: AC
  Administered 2023-09-25: 1000 ug via INTRAMUSCULAR

## 2023-09-25 NOTE — Progress Notes (Signed)
 Per orders of Dr. Clent Ridges, injection of Cyanocobalamin given by Johnella Moloney. Patient tolerated injection well.

## 2023-10-02 ENCOUNTER — Ambulatory Visit (INDEPENDENT_AMBULATORY_CARE_PROVIDER_SITE_OTHER)

## 2023-10-02 DIAGNOSIS — E538 Deficiency of other specified B group vitamins: Secondary | ICD-10-CM | POA: Diagnosis not present

## 2023-10-02 MED ORDER — CYANOCOBALAMIN 1000 MCG/ML IJ SOLN
1000.0000 ug | Freq: Once | INTRAMUSCULAR | Status: AC
  Administered 2023-10-02: 1000 ug via INTRAMUSCULAR

## 2023-10-02 NOTE — Progress Notes (Signed)
 Patient is in office today for a nurse visit for B12 Injection. Patient Injection was given in the  Left deltoid. Patient tolerated injection well.

## 2023-10-07 ENCOUNTER — Telehealth: Payer: Self-pay | Admitting: Family Medicine

## 2023-10-07 NOTE — Telephone Encounter (Signed)
 Requesting a call regarding soreness on both sides and his back, has to get up during the night, hands start tingling like they're going numb, hands swell.feels like his left elbow has been giving him trouble since 08/12/23 when he got labs drawn. Wants to make sure cortisone shot from ortho wouldn't interfere with the B-12 shot. Has put the cortisone shot off until he finds out. Says he has an appointment 10/09/23 and would like to discuss prior

## 2023-10-09 ENCOUNTER — Ambulatory Visit

## 2023-10-09 ENCOUNTER — Telehealth: Payer: Self-pay | Admitting: Family Medicine

## 2023-10-09 DIAGNOSIS — E538 Deficiency of other specified B group vitamins: Secondary | ICD-10-CM

## 2023-10-09 NOTE — Telephone Encounter (Signed)
 Pt has appointment with Dr Alyne Babinski this afternoon for this problem

## 2023-10-09 NOTE — Telephone Encounter (Signed)
 Patient is requesting a call back about some side effects he is having since being told he has low B12.

## 2023-10-09 NOTE — Telephone Encounter (Signed)
 Attempted to call the patient for more information as to the side effects he is having.  Patient keep stating "he does not feel good, Dr Alyne Babinski knows about this, has been crying", cannot sleep, feels he is taking too much B12, does not want to eat, swelling in hands, has back pain, side, arm/elbow and knee pain which he sees a specialist for.  States he has called 3 times or more.  Offered an appt since he has not been seen since March 24th and the patient declined, stated he feels he has not been cared for.  Message sent to Dr Alyne Babinski.

## 2023-10-10 ENCOUNTER — Other Ambulatory Visit: Payer: Self-pay | Admitting: Family Medicine

## 2023-10-10 DIAGNOSIS — E538 Deficiency of other specified B group vitamins: Secondary | ICD-10-CM

## 2023-10-10 NOTE — Telephone Encounter (Signed)
 I am not sure what to say about all these diverse symptoms other than they are unlikely to be due to a high B12 level. Nevertheless, let's hold off on any more shots and have him come to the lab to check another level

## 2023-10-10 NOTE — Telephone Encounter (Signed)
 Spoke with pt advised of Dr Alyne Babinski recommendation scheduled pt for a recheck lab appointment for B12 levels. Pt verbalized understanding

## 2023-10-11 ENCOUNTER — Other Ambulatory Visit: Payer: Self-pay | Admitting: Family Medicine

## 2023-10-11 ENCOUNTER — Telehealth: Payer: Self-pay | Admitting: Family Medicine

## 2023-10-11 MED ORDER — DIAZEPAM 5 MG PO TABS: 5.0000 mg | ORAL_TABLET | Freq: Three times a day (TID) | ORAL | 5 refills | Status: DC | PRN

## 2023-10-11 NOTE — Telephone Encounter (Signed)
 Done

## 2023-10-11 NOTE — Telephone Encounter (Signed)
 Patient came in  and is inquiring about his refill of:  diazepam  (VALIUM ) 5 MG tablet   Patient states he is almost out of this and would like to know how soon his refill can be put in.  [id number] [place], [place] - [address] Phone: [phone]  Fax: [phone]     Please advise at [phone]

## 2023-10-11 NOTE — Telephone Encounter (Signed)
 Pt notified to pick up Rx from his pharmacy

## 2023-10-16 ENCOUNTER — Ambulatory Visit

## 2023-10-16 ENCOUNTER — Other Ambulatory Visit (INDEPENDENT_AMBULATORY_CARE_PROVIDER_SITE_OTHER)

## 2023-10-16 DIAGNOSIS — E538 Deficiency of other specified B group vitamins: Secondary | ICD-10-CM | POA: Diagnosis not present

## 2023-10-16 LAB — VITAMIN B12: Vitamin B-12: 594 pg/mL (ref 211–911)

## 2023-10-17 ENCOUNTER — Ambulatory Visit: Payer: Self-pay | Admitting: Family Medicine

## 2023-10-23 ENCOUNTER — Ambulatory Visit (INDEPENDENT_AMBULATORY_CARE_PROVIDER_SITE_OTHER): Admitting: Family Medicine

## 2023-10-23 ENCOUNTER — Encounter: Payer: Self-pay | Admitting: Family Medicine

## 2023-10-23 ENCOUNTER — Ambulatory Visit

## 2023-10-23 VITALS — BP 140/80 | HR 67 | Temp 97.7°F | Wt 172.0 lb

## 2023-10-23 DIAGNOSIS — G5603 Carpal tunnel syndrome, bilateral upper limbs: Secondary | ICD-10-CM | POA: Diagnosis not present

## 2023-10-23 NOTE — Progress Notes (Signed)
   Subjective:    Patient ID: Anthony Killings., male    DOB: December 23, 1950, 73 y.o.   MRN: [id number]  HPI Here for 3 weeks of numbness, tingling, swelling, and pain in both hands. No color changes. He has never felt this before. We have been correcting his low B12 with shots, and his most recent level on 10-16-23 had gone up from 211 to 594.    Review of Systems  Constitutional: Negative.   Respiratory: Negative.    Cardiovascular: Negative.   Neurological:  Positive for numbness.       Objective:   Physical Exam Constitutional:      Appearance: Normal appearance.  Cardiovascular:     Rate and Rhythm: Normal rate and regular rhythm.     Pulses: Normal pulses.     Heart sounds: Normal heart sounds.  Pulmonary:     Effort: Pulmonary effort is normal.     Breath sounds: Normal breath sounds.  Musculoskeletal:     Comments: Both hands are normal on exam   Neurological:     Mental Status: He is alert.           Assessment & Plan:  This is likely due to carpal tunnel syndrome. We will set up nerve conduction studies on both arms.  Corita Diego, MD

## 2023-10-30 ENCOUNTER — Telehealth: Payer: Self-pay | Admitting: Family Medicine

## 2023-10-30 ENCOUNTER — Ambulatory Visit

## 2023-10-30 NOTE — Telephone Encounter (Signed)
 When agent called the office; agent stated that pt did not want to speak with them and wanted to speak with someone in the office; he refused to tell them anything. Informed pt several times before that he would have to discuss with the call center agents his needs and that he could no longer get transferred to the office. We have transferred him to the office 3 times and each time I have spoken to him and let him know that the agents are more than capable to help him. He understood each time and stated he just like talking with the office people.   Copied from CRM [id number]. Topic: Referral - Question >> Oct 30, 2023 10:38 AM Alyse July wrote: Reason for EAV:[id number] stated he is still hurting due to recent needle stick in office and was not happy about not being able to speak to someone in office regarding the matter. Attempted to have patient speak to someone in office. [facility] decline to take the call and stated they have spoke to patient several times and would not be speaking with him again. Attempted to provide referral information per initial concern and patient interrupted me and stated its not my job or his job to get in touch with the referred office. Patient states he would like a call back from Dr. Alyne Babinski or the nurse to further discuss despite me informing him that he wouldn't receive a call back per CAL because they have spoken to the patient several times about the same matter. Patient informed that I'm able to assist with his current concerns. Patient decline and repeated previous statements and said he needed to go lay down. Agent stated that was okay. Caller said bye and disconnected line.

## 2023-10-31 NOTE — Telephone Encounter (Signed)
 Attempted to return pt call no answer to speak with pt will try again later

## 2023-11-04 ENCOUNTER — Encounter: Payer: Self-pay | Admitting: Family Medicine

## 2023-11-04 ENCOUNTER — Ambulatory Visit (INDEPENDENT_AMBULATORY_CARE_PROVIDER_SITE_OTHER): Admitting: Family Medicine

## 2023-11-04 VITALS — BP 126/68 | HR 71 | Temp 98.0°F

## 2023-11-04 DIAGNOSIS — F411 Generalized anxiety disorder: Secondary | ICD-10-CM

## 2023-11-04 DIAGNOSIS — F410 Panic disorder [episodic paroxysmal anxiety] without agoraphobia: Secondary | ICD-10-CM | POA: Diagnosis not present

## 2023-11-04 DIAGNOSIS — I259 Chronic ischemic heart disease, unspecified: Secondary | ICD-10-CM | POA: Diagnosis not present

## 2023-11-04 DIAGNOSIS — R109 Unspecified abdominal pain: Secondary | ICD-10-CM

## 2023-11-04 LAB — POC URINALSYSI DIPSTICK (AUTOMATED)
Bilirubin, UA: NEGATIVE
Blood, UA: NEGATIVE
Glucose, UA: NEGATIVE
Ketones, UA: NEGATIVE
Leukocytes, UA: NEGATIVE
Nitrite, UA: NEGATIVE
Protein, UA: NEGATIVE
Spec Grav, UA: 1.01 (ref 1.010–1.025)
Urobilinogen, UA: 0.2 U/dL
pH, UA: 6 (ref 5.0–8.0)

## 2023-11-04 NOTE — Addendum Note (Signed)
 Addended by: Philbert Brave on: 11/04/2023 03:37 PM   Modules accepted: Orders

## 2023-11-04 NOTE — Telephone Encounter (Signed)
Pt was seen by Dr. Sarajane Jews this morning.

## 2023-11-04 NOTE — Progress Notes (Signed)
   Subjective:    Patient ID: Anthony Killings., male    DOB: [birth year], 73 y.o.   MRN: [id number]  HPI Here for a number of symptoms. He describes burning in his hands and burning in his kidneys. Also he feels tired all the time and he feels SOB with mild exertion (such as going to the mailbox). He saw Dr. Renna Cary for a cardiologic evaluation on 06-04-23, and his exam and EKG were normal. Anthony Benton was told to follow up as needed. Since then we have had several visits for the above symptoms. He has been referred for NCS to both arms and hands, but this has not been scheduled yet. He had labs drawn here on 08-12-23 which was normal except for a low B12 of 211. He was started on B12 shots, and a repeat level on 10-16-23 was normal at 594. However he says he felt no better. He admits to being under a lot of family stress, and he has taken Valium  for some years. We have spoken several times in the past few years about adding another medication such as an SSRI to this, but he has always declined this.    Review of Systems  Constitutional:  Positive for fatigue.  Respiratory:  Positive for shortness of breath. Negative for cough and wheezing.   Cardiovascular: Negative.   Gastrointestinal: Negative.   Genitourinary: Negative.   Musculoskeletal:  Positive for back pain.       Objective:   Physical Exam Constitutional:      Appearance: Normal appearance.   Cardiovascular:     Rate and Rhythm: Normal rate and regular rhythm.     Pulses: Normal pulses.     Heart sounds: Normal heart sounds.  Pulmonary:     Effort: Pulmonary effort is normal.     Breath sounds: Normal breath sounds.  Abdominal:     Tenderness: There is no right CVA tenderness or left CVA tenderness.   Neurological:     Mental Status: He is alert and oriented to person, place, and time.   Psychiatric:        Behavior: Behavior normal.        Thought Content: Thought content normal.     Comments: He is very anxious             Assessment & Plan:  I think most if not all of his symptoms stem from his high anxiety levels, and he agrees. I again suggested we try a new medication and again he declined. I then offered to refer him to a psychiatrist for help , and he agreed. He will consider who he would like to see and then let us  know. I do think further cardiology workup would be warranted, so we will arrange for him to see Dr. Renna Cary again. We spent a total of (33   ) minutes reviewing records and discussing these issues.  Anthony Diego, MD

## 2023-11-05 ENCOUNTER — Telehealth: Payer: Self-pay | Admitting: *Deleted

## 2023-11-05 ENCOUNTER — Telehealth: Payer: Self-pay

## 2023-11-05 ENCOUNTER — Encounter: Payer: Self-pay | Admitting: Neurology

## 2023-11-05 ENCOUNTER — Other Ambulatory Visit: Payer: Self-pay

## 2023-11-05 DIAGNOSIS — G5603 Carpal tunnel syndrome, bilateral upper limbs: Secondary | ICD-10-CM

## 2023-11-05 DIAGNOSIS — R202 Paresthesia of skin: Secondary | ICD-10-CM

## 2023-11-05 NOTE — Telephone Encounter (Signed)
 Spoke with Dr Renna Cary office [facility] regarding pt needing a sooner appointment per Dr Alyne Babinski, advised that the soonest they can seen pt is 11/12/23 at 8.50 am with Daine Dunn Dr Renna Cary PA. Left detailed message advised to cal the office regarding this message. Pt referral to Neurology has been sent to [facility].

## 2023-11-05 NOTE — Telephone Encounter (Signed)
 Referral placed.

## 2023-11-06 ENCOUNTER — Telehealth: Payer: Self-pay | Admitting: Family Medicine

## 2023-11-06 ENCOUNTER — Ambulatory Visit

## 2023-11-06 NOTE — Telephone Encounter (Signed)
 Copied from CRM [id number]. Topic: Referral - Question >> Nov 05, 2023  5:07 PM Chuck Crater wrote: Patient is requesting a callback from [place] around 9 or 10 am. On tomorrow in regards to a referral.

## 2023-11-06 NOTE — Telephone Encounter (Signed)
 Spoke to the patient and he stated that Anthony Benton has already called him.  Nothing further needed.

## 2023-11-06 NOTE — Telephone Encounter (Signed)
 Noted

## 2023-11-07 NOTE — Progress Notes (Signed)
 Cardiology Office Note    Date:  11/09/2023  ID:  Anthony Benton., DOB 04-19-1951, MRN [id number] PCP:  Johnny Garnette LABOR, MD  Cardiologist:  Oneil Parchment, MD  Electrophysiologist:  None   Chief Complaint: Shortness of breath/palpitations History of Present Illness: .    Anthony Benton. is a 73 y.o. male with visit-pertinent history of coronary artery disease, hypertension and hyperlipidemia.  In 2012 patient underwent cardiac catheterization resulting in placement of 2 DES in the RCA.  Initial symptoms were exercise-induced occurring during walks along his driveway.  Patient with a family history of high cholesterol and a sister passed away from bone cancer in 2018.  Patient is a former smoker.  Patient was seen by Jackee Alberts, NP on 09/12/2022, patient was experiencing exertional chest discomfort and pressure related to increased anxiety.  MPI was ordered however does not appear to ever been completed.  Patient was seen by Dr. Parchment in clinic on 06/04/2023.  Patient reported of experiencing significant emotional stress and sadness related to the separation.  He been living alone and reported feeling increased loneliness particular during the winter months.  He reported experiencing panic attacks the prior 6 months which he attributed to his emotional state.  He has been followed by Dr. Johnny for this.  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 Patient was continued on aspirin 81 mg daily, Lipitor 80 mg daily and lisinopril  40 mg daily.  Today he presents for follow up. He reports that he is doing well overall. He denies chest pain, lower extremity edema, orthopnea or pnd.  He notes occasional palpitations that he feels is likely related to panic attacks.  Patient notes that he has significant problems with anxiety and depression, notes significant sadness related to separation from his wife many years  ago.  Patient notes that he in recent months has started having anxiety attacks that feel as though he is having a slight chest discomfort mainly described as having a sensation of warmth in his chest.  He also notes some occasional shortness of breath which he feels is also related to anxiety.  ROS: .   Today he denies lower extremity edema, fatigue, melena, hematuria, hemoptysis, diaphoresis, weakness, presyncope, syncope, orthopnea, and PND.  All other systems are reviewed and otherwise negative. Studies Reviewed: SABRA    EKG:  EKG is ordered today, personally reviewed, demonstrating  EKG Interpretation Date/Time:  Friday November 08 2023 15:07:34 EDT Ventricular Rate:  59 PR Interval:  166 QRS Duration:  76 QT Interval:  398 QTC Calculation: 394 R Axis:   -5  Text Interpretation: Sinus bradycardia with Fusion complexes Minimal voltage criteria for LVH, may be normal variant ( R in aVL ) Inferior infarct (cited on or before 04-Jun-2023) When compared with ECG of 04-Jun-2023 13:35, Fusion complexes are now Present Premature ventricular complexes are no longer Present Premature atrial complexes are no longer Present Right bundle branch block is no longer Present Confirmed by Willi Borowiak [phone]) on 11/09/2023 7:24:15 PM   CV Studies: Cardiac studies reviewed are outlined and summarized above. Otherwise please see EMR for full report.      Current Reported Medications:.    Current Meds  Medication Sig   Acetaminophen  (TYLENOL  EXTRA STRENGTH PO) Take 500 mg by mouth daily as needed (pain).    amLODipine  (NORVASC ) 2.5 MG tablet TAKE ONE TABLET BY MOUTH DAILY FOR HIGH BLOOD  PRESSURE   aspirin 81 MG tablet Take 81 mg by mouth daily.   atorvastatin  (LIPITOR) 80 MG tablet TAKE 1 TABLET BY MOUTH EVERY DAY   diazepam  (VALIUM ) 5 MG tablet Take 1 tablet (5 mg total) by mouth every 8 (eight) hours as needed for anxiety.   ketoconazole  (NIZORAL ) 2 % cream Apply 1 Application topically 2 (two) times daily  as needed for irritation.   lisinopril  (ZESTRIL ) 40 MG tablet TAKE 1 TABLET BY MOUTH EVERY DAY   metoprolol  succinate (TOPROL -XL) 50 MG 24 hr tablet TAKE 1 TABLET BY MOUTH EVERY DAY WITH OR IMMEDIATELY FOLLOWING A MEAL   Multiple Vitamin (MULTIVITAMIN) tablet Take 1 tablet by mouth daily.   nitroGLYCERIN  (NITROSTAT ) 0.4 MG SL tablet Place 1 tablet (0.4 mg total) under the tongue every 5 (five) minutes as needed for chest pain.   Omega-3 Fatty Acids (FISH OIL) 1000 MG CAPS Take 1,000 mg by mouth daily.    Physical Exam:    VS:  BP 128/72   Pulse 65   Resp 16   Ht 5' 9 (1.753 m)   Wt 173 lb 12.8 oz (78.8 kg)   SpO2 98%   BMI 25.67 kg/m    Wt Readings from Last 3 Encounters:  11/08/23 173 lb 12.8 oz (78.8 kg)  10/23/23 172 lb (78 kg)  08/12/23 169 lb 3.2 oz (76.7 kg)    GEN: Well nourished, well developed in no acute distress NECK: No JVD; No carotid bruits CARDIAC: RRR, no murmurs, rubs, gallops RESPIRATORY:  Clear to auscultation without rales, wheezing or rhonchi  ABDOMEN: Soft, non-tender, non-distended EXTREMITIES:  No edema; No acute deformity     Asessement and Plan:.    CAD: S/p DES to the RCA x 2 in 2012. EKG today indicates sinus rhythm with fusion complexes.  Patient denies any chest pain, he notes a vague chest discomfort described as having a warmth sensation in his chest which he feels is related to anxiety attacks.  He also reports some occasional shortness of breath which he also feels is related to anxiety however does occur sometimes with exertion, he overall offers vague descriptions of complaints.  Patient deferred any ischemic evaluation at this time, was agreeable to checking echocardiogram and wearing cardiac monitor as noted below.  Reviewed ED precautions.  Continue amlodipine  2.5 mg daily, aspirin 81 mg daily, Lipitor 80 mg daily, lisinopril  40 mg daily, metoprolol  succinate 50 mg daily.  Palpitations: Patient reports occasional episodes of palpitations,  described as a skipped beat feeling with some shortness of breath.  He denies any prolonged palpitations.  He denies any presyncope, syncope, increased fatigue, dizziness or lightheadedness. EKG today indicates normal sinus rhythm with fusion complexes, prior EKGs indicate PACs and PVCs.  Patient initially refused a cardiac monitor then became agreeable to a 3-day cardiac monitor.  Will also check echocardiogram as noted above.  Reviewed ED precautions. Check CBC, BMET, Mag and TSH.   Hypertension: Initial blood pressure today 142/66, on recheck was 128/72.  Continue current antihypertensive regimen.  Hyperlipidemia: Last lipid profile 01/30/2023 indicated total cholesterol 188, HDL 52, triglycerides 139 and LDL 108. Discussed with patient given history of CAD LDL should be less than 70. He declined any changes at this time.   Anxiety: Patient reports significant anxiety and depression related to his divorce a few years prior.  This is monitored and managed by his PCP.   Disposition: F/u with Dak Szumski, NP in 2 months or sooner if needed.  Signed, Waneta Fitting D Nini Cavan, NP

## 2023-11-08 ENCOUNTER — Ambulatory Visit

## 2023-11-08 ENCOUNTER — Encounter: Payer: Self-pay | Admitting: Cardiology

## 2023-11-08 ENCOUNTER — Ambulatory Visit: Attending: Cardiology | Admitting: Cardiology

## 2023-11-08 VITALS — BP 128/72 | HR 65 | Resp 16 | Ht 69.0 in | Wt 173.8 lb

## 2023-11-08 DIAGNOSIS — I10 Essential (primary) hypertension: Secondary | ICD-10-CM

## 2023-11-08 DIAGNOSIS — R0602 Shortness of breath: Secondary | ICD-10-CM

## 2023-11-08 DIAGNOSIS — R002 Palpitations: Secondary | ICD-10-CM | POA: Diagnosis not present

## 2023-11-08 DIAGNOSIS — I2510 Atherosclerotic heart disease of native coronary artery without angina pectoris: Secondary | ICD-10-CM

## 2023-11-08 DIAGNOSIS — E782 Mixed hyperlipidemia: Secondary | ICD-10-CM | POA: Diagnosis not present

## 2023-11-08 DIAGNOSIS — R0789 Other chest pain: Secondary | ICD-10-CM | POA: Diagnosis not present

## 2023-11-08 NOTE — Progress Notes (Unsigned)
 Applied a 3 day Zio XT monitor to patient in the office  Skains to read

## 2023-11-08 NOTE — Patient Instructions (Signed)
 Medication Instructions:  No changes *If you need a refill on your cardiac medications before your next appointment, please call your pharmacy*  Lab Work: Today we are going to draw a CBC, Bmet, Mag, and TSH If you have labs (blood work) drawn today and your tests are completely normal, you will receive your results only by: MyChart Message (if you have MyChart) OR A paper copy in the mail If you have any lab test that is abnormal or we need to change your treatment, we will call you to review the results.  Testing/Procedures: Your physician has requested that you have an echocardiogram. Echocardiography is a painless test that uses sound waves to create images of your heart. It provides your doctor with information about the size and shape of your heart and how well your heart's chambers and valves are working. This procedure takes approximately one hour. There are no restrictions for this procedure. Please do NOT wear cologne, perfume, aftershave, or lotions (deodorant is allowed). Please arrive 15 minutes prior to your appointment time.  Please note: We ask at that you not bring children with you during ultrasound (echo/ vascular) testing. Due to room size and safety concerns, children are not allowed in the ultrasound rooms during exams. Our front office staff cannot provide observation of children in our lobby area while testing is being conducted. An adult accompanying a patient to their appointment will only be allowed in the ultrasound room at the discretion of the ultrasound technician under special circumstances. We apologize for any inconvenience.  Follow-Up: At [facility], you and your health needs are our priority.  As part of our continuing mission to provide you with exceptional heart care, our providers are all part of one team.  This team includes your primary Cardiologist (physician) and Advanced Practice Providers or APPs (Physician Assistants and Nurse Practitioners)  who all work together to provide you with the care you need, when you need it.  Your next appointment:   8 week(s)  Provider:   Charles Connor, NP or Katlyn West, NP    We recommend signing up for the patient portal called MyChart.  Sign up information is provided on this After Visit Summary.  MyChart is used to connect with patients for Virtual Visits (Telemedicine).  Patients are able to view lab/test results, encounter notes, upcoming appointments, etc.  Non-urgent messages can be sent to your provider as well.   To learn more about what you can do with MyChart, go to [email or web address].

## 2023-11-09 ENCOUNTER — Ambulatory Visit: Payer: Self-pay | Admitting: Cardiology

## 2023-11-09 ENCOUNTER — Encounter: Payer: Self-pay | Admitting: Cardiology

## 2023-11-09 LAB — BASIC METABOLIC PANEL WITH GFR
BUN/Creatinine Ratio: 9 — ABNORMAL LOW (ref 10–24)
BUN: 8 mg/dL (ref 8–27)
CO2: 20 mmol/L (ref 20–29)
Calcium: 10.1 mg/dL (ref 8.6–10.2)
Chloride: 106 mmol/L (ref 96–106)
Creatinine, Ser: 0.86 mg/dL (ref 0.76–1.27)
Glucose: 83 mg/dL (ref 70–99)
Potassium: 4 mmol/L (ref 3.5–5.2)
Sodium: 143 mmol/L (ref 134–144)
eGFR: 91 mL/min/{1.73_m2} (ref >59)

## 2023-11-09 LAB — MAGNESIUM: Magnesium: 2.1 mg/dL (ref 1.6–2.3)

## 2023-11-09 LAB — CBC
Hematocrit: 43.7 % (ref 37.5–51.0)
Hemoglobin: 14.4 g/dL (ref 13.0–17.7)
MCH: 31.6 pg (ref 26.6–33.0)
MCHC: 33 g/dL (ref 31.5–35.7)
MCV: 96 fL (ref 79–97)
Platelets: 195 10*3/uL (ref 150–450)
RBC: 4.55 x10E6/uL (ref 4.14–5.80)
RDW: 13 % (ref 11.6–15.4)
WBC: 8.2 10*3/uL (ref 3.4–10.8)

## 2023-11-09 LAB — TSH: TSH: 1.23 u[IU]/mL (ref 0.450–4.500)

## 2023-11-12 ENCOUNTER — Ambulatory Visit: Admitting: Physician Assistant

## 2023-11-13 ENCOUNTER — Telehealth: Payer: Self-pay | Admitting: Cardiology

## 2023-11-13 NOTE — Telephone Encounter (Signed)
 Patient was given results bye triage

## 2023-11-13 NOTE — Telephone Encounter (Signed)
-----   Message from Rosabel BIRCH [place] sent at 11/09/2023  2:34 PM EDT ----- Please let Mr. Hawes know that his CBC showed no evidence of anemia or infection. His kidney function and electrolytes were normal. His thyroid  function was normal. Good results!  ----- Message ----- From: Interface, [facility] Lab Results In Sent: 11/08/2023  11:41 PM EDT To: Katlyn D West, NP

## 2023-11-13 NOTE — Telephone Encounter (Signed)
 Patient does not want to schedule right now. Would like to wait until January to see Dr. Jeffrie unless his heart monitor or echo comes back abnormal. FYI

## 2023-11-13 NOTE — Telephone Encounter (Signed)
 Left message to call back

## 2023-11-13 NOTE — Telephone Encounter (Signed)
-----   Message from Ridgeway E sent at 11/08/2023  4:48 PM EDT ----- Regarding: appointment Need 8 week appointment with Jeffrie or Jackee

## 2023-11-13 NOTE — Telephone Encounter (Signed)
 I called the patient back. He is quite upset that he had lab work and testing at all.  He went to the primary call doctor because he was tired.  He said he was tired because he wasn't eating because he was down,depressed and stressed out.   He said he had palpitations and shortness of breath because its hot outside and that doesn't help anything.    I was able to give him the lab results and he is very pleased with this information.  He just placed his monitor into the mail this morning.   Encouraged hydration.    He will see Dr. Oneil again in Jan of next year.  He will call and let us  know if he needs anything sooner.   He doesn't like that Dr. Jeffrie told him that his primary care doctor can do his EKG instead of the specialist.  He has lost faith in his regular doctor.

## 2023-11-13 NOTE — Telephone Encounter (Signed)
   Patient calling for lab results

## 2023-11-19 DIAGNOSIS — R002 Palpitations: Secondary | ICD-10-CM | POA: Diagnosis not present

## 2023-11-20 DIAGNOSIS — R002 Palpitations: Secondary | ICD-10-CM

## 2023-11-26 ENCOUNTER — Other Ambulatory Visit: Payer: Self-pay | Admitting: Family Medicine

## 2023-11-27 ENCOUNTER — Telehealth: Payer: Self-pay | Admitting: Cardiology

## 2023-11-27 ENCOUNTER — Telehealth (HOSPITAL_COMMUNITY): Payer: Self-pay | Admitting: Cardiology

## 2023-11-27 NOTE — Telephone Encounter (Signed)
 LVMTCB 11/27/2023 at 2:07 pm  The below are the results from a 3D Zio monitor that was placed in the office on 11/08/2023.    Katlyn D West, NP 11/21/2023  1:06 PM EDT     Please let Mr. Collington know that his cardiac monitor showed an average heart rate of 67 bpm, his predominant underlying rhythm was sinus rhythm, he had one short run of fast beats that originated from the top chamber of the heart. Overall good results! Continue current medications and follow up as planned.     Echo canceled, per Chloe McNeil, due to pt requesting it be canceled.

## 2023-11-27 NOTE — Telephone Encounter (Signed)
 Patient cancelled echocardiogram for reason below:  11/27/2023 12:39 PM Ab:FRWZPO, CHLOE R  Cancel Rsn: Patient (pt does not wish to keep appt)  Order will be removed from the echo WQ. Thank you.

## 2023-11-27 NOTE — Telephone Encounter (Signed)
 Pt returned call. Went over results. Pt over results, pt thankful for the good news.

## 2023-11-27 NOTE — Telephone Encounter (Signed)
 Pt called in stating with a number of issues to discuss. He mainly stated that someone put a heart monitor on him in the office and said that he was being mailed another one. Only one was ordered so I'm not sure where the second one came from or if another one is even being sent out.   He states he does not want to have echo anymore. I cancelled it.

## 2023-11-28 NOTE — Telephone Encounter (Signed)
 Patient received results from triage

## 2023-11-28 NOTE — Telephone Encounter (Signed)
-----   Message from Katlyn D [place] sent at 11/21/2023  1:06 PM EDT ----- Please let Mr. Bir know that his cardiac monitor showed an average heart rate of 67 bpm, his predominant underlying rhythm was sinus rhythm, he had one short run of fast beats that originated  from the top chamber of the heart. Overall good results! Continue current medications and follow up as planned.  ----- Message ----- From: Jeffrie Oneil BROCKS, MD Sent: 11/20/2023   2:55 PM EDT To: Katlyn D West, NP

## 2023-12-12 ENCOUNTER — Ambulatory Visit: Admitting: Neurology

## 2023-12-12 DIAGNOSIS — R202 Paresthesia of skin: Secondary | ICD-10-CM | POA: Diagnosis not present

## 2023-12-12 DIAGNOSIS — G5601 Carpal tunnel syndrome, right upper limb: Secondary | ICD-10-CM

## 2023-12-12 NOTE — Procedures (Signed)
 [facility]  [address] [place], [address]  [place], [place] [address] Tel: [phone] Fax: [phone] Test Date:  12/12/2023  Patient: Anthony Benton, Anthony Benton. DOB: 1950-12-25 Physician: Tonita Blanch, DO  Sex: Male Height: 5' 9 Ref Phys: Tonita Blanch, DO  ID#: [id number]   Technician:    History: This is a 73 year old man referred for evaluation of bilateral upper extremity paresthesias and pain.  NCV & EMG Findings: Extensive electrodiagnostic testing of the right upper extremity and additional studies of the left shows:  Right mixed palmar sensory response shows prolonged latency.  Left mixed palmar, bilateral median, and bilateral ulnar sensory responses are within normal limits. Bilateral median and ulnar motor responses are within normal limits. There is no evidence of active or chronic motor axonal loss changes affecting any of the tested muscles.  Motor unit configuration and recruitment pattern is within normal limits.  Impression: Right median neuropathy at or distal to the wrist, consistent with a clinical diagnosis of carpal tunnel syndrome.  Overall, these findings are mild in degree electrically. There is no evidence of left carpal tunnel syndrome, ulnar neuropathy, or a cervical radiculopathy.   ___________________________ Tonita Blanch, DO    Nerve Conduction Studies   Stim Site NR Peak (ms) Norm Peak (ms) O-P Amp (V) Norm O-P Amp  Left Median Anti Sensory (2nd Digit)  32 C  Wrist    2.9 <3.8 16.2 >10  Right Median Anti Sensory (2nd Digit)  32 C  Wrist    3.8 <3.8 16.7 >10  Left Ulnar Anti Sensory (5th Digit)  32 C  Wrist    2.6 <3.2 15.6 >5  Right Ulnar Anti Sensory (5th Digit)  32 C  Wrist    2.5 <3.2 18.7 >5     Stim Site NR Onset (ms) Norm Onset (ms) O-P Amp (mV) Norm O-P Amp Site1 Site2 Delta-0 (ms) Dist (cm) Vel (m/s) Norm Vel (m/s)  Left Median Motor (Abd Poll Brev)  32 C  Wrist    3.0 <4.0 7.6 >5 Elbow Wrist 5.1 29.0 57 >50  Elbow     8.1  7.0         Right Median Motor Run #2 (Abd Poll Brev)  32 C  Wrist    3.4 <4.0 7.5 >5 Elbow Wrist 5.4 29.0 54 >50  Elbow    8.8  6.7         Left Ulnar Motor (Abd Dig Minimi)  32 C  Wrist    2.2 <3.1 10.0 >7 B Elbow Wrist 3.6 23.0 64 >50  B Elbow    5.8  9.4  A Elbow B Elbow 1.6 10.0 62 >50  A Elbow    7.4  9.2         Right Ulnar Motor (Abd Dig Minimi)  32 C  Wrist    2.2 <3.1 13.1 >7 B Elbow Wrist 3.6 24.0 67 >50  B Elbow    5.8  12.6  A Elbow B Elbow 1.6 10.0 62 >50  A Elbow    7.4  12.1            Stim Site NR Peak (ms) Norm Peak (ms) P-T Amp (V) Site1 Site2 Delta-P (ms) Norm Delta (ms)  Left Median/Ulnar Palm Comparison (Wrist - 8cm)  32 C  Median Palm    1.6 <2.2 43.0 Median Palm Ulnar Palm 0.1   Ulnar Palm    1.5 <2.2 13.7      Right Median/Ulnar Palm Comparison (Wrist -  8cm)  32 C  Median Palm    *2.6 <2.2 22.2 Median Palm Ulnar Palm *1.0   Ulnar Palm    1.6 <2.2 11.6       Electromyography   Side Muscle Ins.Act Fibs Fasc Recrt Amp Dur Poly Activation Comment  Right 1stDorInt Nml Nml Nml Nml Nml Nml Nml Nml N/A  Right Abd Poll Brev Nml Nml Nml Nml Nml Nml Nml Nml N/A  Right PronatorTeres Nml Nml Nml Nml Nml Nml Nml Nml N/A  Right Biceps Nml Nml Nml Nml Nml Nml Nml Nml N/A  Right Triceps Nml Nml Nml Nml Nml Nml Nml Nml N/A  Right Deltoid Nml Nml Nml Nml Nml Nml Nml Nml N/A  Left 1stDorInt Nml Nml Nml Nml Nml Nml Nml Nml N/A  Left PronatorTeres Nml Nml Nml Nml Nml Nml Nml Nml N/A  Left Biceps Nml Nml Nml Nml Nml Nml Nml Nml N/A  Left Triceps Nml Nml Nml Nml Nml Nml Nml Nml N/A  Left Deltoid Nml Nml Nml Nml Nml Nml Nml Nml N/A      Waveforms:

## 2023-12-17 ENCOUNTER — Ambulatory Visit: Payer: Self-pay | Admitting: Family Medicine
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
## 2023-12-23 DIAGNOSIS — M79641 Pain in right hand: Secondary | ICD-10-CM | POA: Diagnosis not present

## 2023-12-23 DIAGNOSIS — M545 Low back pain, unspecified: Secondary | ICD-10-CM | POA: Diagnosis not present

## 2023-12-23 DIAGNOSIS — M542 Cervicalgia: Secondary | ICD-10-CM | POA: Diagnosis not present

## 2023-12-23 NOTE — Telephone Encounter (Signed)
 I understand.

## 2023-12-24 ENCOUNTER — Other Ambulatory Visit (HOSPITAL_COMMUNITY)

## 2024-01-31 ENCOUNTER — Ambulatory Visit: Admitting: Family Medicine

## 2024-01-31 ENCOUNTER — Encounter: Payer: Self-pay | Admitting: Family Medicine

## 2024-01-31 VITALS — BP 132/74 | HR 72 | Temp 98.0°F | Wt 173.0 lb

## 2024-01-31 DIAGNOSIS — M150 Primary generalized (osteo)arthritis: Secondary | ICD-10-CM

## 2024-01-31 MED ORDER — HYDROCODONE-ACETAMINOPHEN 5-325 MG PO TABS: 1.0000 | ORAL_TABLET | Freq: Four times a day (QID) | ORAL | 0 refills | Status: AC | PRN

## 2024-01-31 NOTE — Progress Notes (Signed)
   Subjective:    Patient ID: Anthony Benton., male    DOB: [birth year], 73 y.o.   MRN: [id number]  HPI Here asking for help with his back pain and hand pain. We have discussed this numerous times over the past few years, but he has always been leery of trying any new medications. He takes Tylenol  but gets very little relief. The pain keeps him from sleeping. Today he says he has changed his mind and that he would like to try some Hydrocodone  after all.    Review of Systems  Constitutional: Negative.   Musculoskeletal:  Positive for arthralgias and back pain.       Objective:   Physical Exam Constitutional:      Appearance: Normal appearance.  Cardiovascular:     Rate and Rhythm: Normal rate and regular rhythm.     Pulses: Normal pulses.     Heart sounds: Normal heart sounds.  Pulmonary:     Effort: Pulmonary effort is normal.     Breath sounds: Normal breath sounds.  Neurological:     Mental Status: He is alert.           Assessment & Plan:  OA. He will try some Norco 5-325 as needed. Report back to us  in 2 weeks.  Garnette Olmsted, MD

## 2024-02-12 ENCOUNTER — Ambulatory Visit: Payer: Self-pay

## 2024-02-12 ENCOUNTER — Telehealth: Payer: Self-pay | Admitting: *Deleted

## 2024-02-12 NOTE — Telephone Encounter (Signed)
 FYI Only or Action Required?: Action required by provider: request for appointment.  Patient was last seen in primary care on 01/31/2024 by Anthony Benton LABOR, MD.  Called Nurse Triage reporting Rectal Bleeding.  Symptoms began today.  Interventions attempted: Nothing.  Symptoms are: unchanged.  Triage Disposition: See PCP Within 2 Weeks  Patient/caregiver understands and will follow disposition?: No, refuses disposition     Copied from CRM [id number]. Topic: Clinical - Red Word Triage >> Feb 12, 2024  4:38 PM [name] wrote: Red Word that prompted transfer to Nurse Triage: Patient is calling in requesting an urgent appointment. He then stated that he went to the bathroom when he was cleaning up he had blood on the toilet paper, patient stated that he tried multiple times and it was still bleeding. Patient also stated that his hands are numb, tingling and painful. Patient refused nurse triage and stated he just wants an appointment, when following the NT refusal process, the patient ended the call. >> Feb 12, 2024  4:54 PM [name] wrote: Both hands are tingly and numb and has been that way for months. Patient's hands hurt and ache so bad due to nerve damage. Patient said he can't sleep because of it.  Patient has a hemrrohoid problem. If patient wipe too hard, it will bust a blood vessel.  That's what he thinks is going on but is not 100% sure.    Reason for Disposition  [1] Rectal bleeding is minimal (e.g., blood just on toilet paper, few drops, streaks on surface of normal formed BM) AND [2] bleeding recurs 3 or more times after using Care Advice  Hand pain is a chronic symptom (recurrent or ongoing AND present > 4 weeks)  Answer Assessment - Initial Assessment Questions Patient advised that Dr. Johnny does not have an appointment tomorrow and is asking if he could be squeezed in. Patient only wants to see Dr. Johnny. Please advise.    1. APPEARANCE of BLOOD: What color is it? Is it passed  separately, on the surface of the stool, or mixed in with the stool?      Bright red on toilet paper  2. AMOUNT: How much blood was passed?      Small amount on toilet paper  4. ONSET: When was the blood first seen in the stools? (Days or weeks)      Today  5. DIARRHEA: Is there also some diarrhea? If Yes, ask: How many diarrhea stools in the past 24 hours?      No 6. CONSTIPATION: Do you have constipation? If Yes, ask: How bad is it?     No 7. RECURRENT SYMPTOMS: Have you had blood in your stools before? If Yes, ask: When was the last time? and What happened that time?      Yes, history of hemorrhoids  8. BLOOD THINNERS: Do you take any blood thinners? (e.g., aspirin, clopidogrel / Plavix, coumadin, heparin). Notes: Other strong blood thinners include: Arixtra (fondaparinux), Eliquis (apixaban), Pradaxa (dabigatran), and Xarelto (rivaroxaban).     No 9. OTHER SYMPTOMS: Do you have any other symptoms?  (e.g., abdomen pain, vomiting, dizziness, fever)     Hand numbness and tingling for a few months  Answer Assessment - Initial Assessment Questions 1. ONSET: When did the pain start?     Months ago 2. LOCATION: Where is the pain located?     Bilateral hands  3. PAIN: How bad is the pain? (Scale 1-10; or mild, moderate, severe)  Moderate  4. WORK OR EXERCISE: Has there been any recent work or exercise that involved this part (i.e., hand or wrist) of the body?     No 7. OTHER SYMPTOMS: Do you have any other symptoms? (e.g., fever, neck pain, numbness or tingling, rash, swelling)     Numbness/tingling of hands, some minor rectal bleeding  Protocols used: Rectal Bleeding-A-AH, Hand Pain-A-AH

## 2024-02-12 NOTE — Telephone Encounter (Signed)
 Copied from CRM [id number]. Topic: Clinical - Red Word Triage >> Feb 12, 2024  4:38 PM [name] wrote: Red Word that prompted transfer to Nurse Triage: Patient is calling in requesting an urgent appointment. He then stated that he went to the bathroom when he was cleaning up he had blood on the toilet paper, patient stated that he tried multiple times and it was still bleeding. Patient also stated that his hands are numb, tingling and painful. Patient refused nurse triage and stated he just wants an appointment, when following the NT refusal process, the patient ended the call.

## 2024-02-13 NOTE — Telephone Encounter (Signed)
 Returned pt call offered pt  an appointment with Dr Johnny for tomorrow,pt declined stated that he was disappointed by the system whenever he tries to call to see his PCP. Pt stated that he will come to the office if he needs to schedule appointment.

## 2024-02-13 NOTE — Telephone Encounter (Signed)
 FYI Spoke with pt offered appointment with Dr Johnny declined state that if he needs appointment he will come to the office to schedule one

## 2024-02-26 ENCOUNTER — Encounter: Payer: Self-pay | Admitting: Family Medicine

## 2024-02-26 ENCOUNTER — Ambulatory Visit: Admitting: Family Medicine

## 2024-02-26 VITALS — BP 124/60 | HR 74 | Temp 98.1°F | Wt 171.0 lb

## 2024-02-26 DIAGNOSIS — G629 Polyneuropathy, unspecified: Secondary | ICD-10-CM | POA: Diagnosis not present

## 2024-02-26 DIAGNOSIS — R739 Hyperglycemia, unspecified: Secondary | ICD-10-CM | POA: Diagnosis not present

## 2024-02-26 DIAGNOSIS — E538 Deficiency of other specified B group vitamins: Secondary | ICD-10-CM

## 2024-02-26 LAB — HEPATIC FUNCTION PANEL
ALT: 15 U/L (ref 0–53)
AST: 18 U/L (ref 0–37)
Albumin: 4.8 g/dL (ref 3.5–5.2)
Alkaline Phosphatase: 78 U/L (ref 39–117)
Bilirubin, Direct: 0.1 mg/dL (ref 0.0–0.3)
Total Bilirubin: 0.7 mg/dL (ref 0.2–1.2)
Total Protein: 7.2 g/dL (ref 6.0–8.3)

## 2024-02-26 LAB — CBC WITH DIFFERENTIAL/PLATELET
Basophils Absolute: 0 K/uL (ref 0.0–0.1)
Basophils Relative: 0.4 % (ref 0.0–3.0)
Eosinophils Absolute: 0.1 K/uL (ref 0.0–0.7)
Eosinophils Relative: 1 % (ref 0.0–5.0)
HCT: 44.3 % (ref 39.0–52.0)
Hemoglobin: 15.2 g/dL (ref 13.0–17.0)
Lymphocytes Relative: 21.5 % (ref 12.0–46.0)
Lymphs Abs: 1.9 K/uL (ref 0.7–4.0)
MCHC: 34.3 g/dL (ref 30.0–36.0)
MCV: 93.9 fl (ref 78.0–100.0)
Monocytes Absolute: 0.7 K/uL (ref 0.1–1.0)
Monocytes Relative: 7.8 % (ref 3.0–12.0)
Neutro Abs: 6 K/uL (ref 1.4–7.7)
Neutrophils Relative %: 69.3 % (ref 43.0–77.0)
Platelets: 204 K/uL (ref 150.0–400.0)
RBC: 4.72 Mil/uL (ref 4.22–5.81)
RDW: 13.5 % (ref 11.5–15.5)
WBC: 8.7 K/uL (ref 4.0–10.5)

## 2024-02-26 LAB — BASIC METABOLIC PANEL WITH GFR
BUN: 9 mg/dL (ref 6–23)
CO2: 25 meq/L (ref 19–32)
Calcium: 10.2 mg/dL (ref 8.4–10.5)
Chloride: 103 meq/L (ref 96–112)
Creatinine, Ser: 0.87 mg/dL (ref 0.40–1.50)
GFR: 85.3 mL/min (ref >60.00)
Glucose, Bld: 83 mg/dL (ref 70–99)
Potassium: 4 meq/L (ref 3.5–5.1)
Sodium: 140 meq/L (ref 135–145)

## 2024-02-26 LAB — HEMOGLOBIN A1C: Hgb A1c MFr Bld: 6.1 % (ref 4.6–6.5)

## 2024-02-26 LAB — TSH: TSH: 1.24 u[IU]/mL (ref 0.35–5.50)

## 2024-02-26 LAB — VITAMIN B12: Vitamin B-12: 448 pg/mL (ref 211–911)

## 2024-02-26 LAB — MAGNESIUM: Magnesium: 2.2 mg/dL (ref 1.5–2.5)

## 2024-02-26 MED ORDER — GABAPENTIN 100 MG PO CAPS: 100.0000 mg | ORAL_CAPSULE | Freq: Two times a day (BID) | ORAL | 2 refills | Status: DC

## 2024-02-26 NOTE — Progress Notes (Addendum)
 Anthony Benton.                                          MRN: [id number]   02/26/2024   The [facility] Quality Team Specialist reviewed this patient medical record for the purposes of chart review for care gap closure. The following were reviewed: chart review for care gap closure-colorectal cancer screening.  06/08/2024- cannot close col 2025    [facility] Quality Team

## 2024-02-26 NOTE — Progress Notes (Signed)
   Subjective:    Patient ID: Anthony Benton., male    DOB: Jun 19, 1950, 73 y.o.   MRN: [id number]  HPI Here to discuss numbness and tingling and burning in the hands and feet. We have talked about this with his hands for about a year, but now his feet are getting the same symptoms. They wax and wane but never go away. The skin never changes colors.    Review of Systems  Constitutional: Negative.   Respiratory: Negative.    Cardiovascular: Negative.   Neurological:  Positive for numbness.       Objective:   Physical Exam Constitutional:      Appearance: Normal appearance.  Cardiovascular:     Rate and Rhythm: Normal rate and regular rhythm.     Pulses: Normal pulses.     Heart sounds: Normal heart sounds.  Pulmonary:     Effort: Pulmonary effort is normal.     Breath sounds: Normal breath sounds.  Musculoskeletal:     Comments: Both hands and both feet are pink and warm with full pulses   Neurological:     Mental Status: He is alert.           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 We will get labs to screen for diabetes, etc. Check a B12 level. He will try Gabapentin 100 mg BID and report back in a few weeks.  Garnette Olmsted, MD

## 2024-02-27 ENCOUNTER — Ambulatory Visit: Payer: Self-pay | Admitting: Family Medicine

## 2024-02-27 ENCOUNTER — Other Ambulatory Visit: Payer: Self-pay

## 2024-03-17 ENCOUNTER — Telehealth: Payer: Self-pay | Admitting: *Deleted

## 2024-03-17 MED ORDER — PREDNISONE 10 MG PO TABS: 10.0000 mg | ORAL_TABLET | Freq: Every day | ORAL | 0 refills | Status: DC

## 2024-03-17 MED ORDER — PREDNISONE 5 MG PO TABS: 5.0000 mg | ORAL_TABLET | Freq: Every day | ORAL | 5 refills | Status: DC

## 2024-03-17 NOTE — Telephone Encounter (Signed)
Pt notified of the dose change.

## 2024-03-17 NOTE — Telephone Encounter (Signed)
 Pt states that he wants to take the Prednisone  5 mg and not 10 mg states that the 10 mg gives him the jitters. Ok to cancel 10 mg and send the 5 mg

## 2024-03-17 NOTE — Telephone Encounter (Signed)
 I changed to the 5 mg dose

## 2024-03-17 NOTE — Telephone Encounter (Signed)
 I sent in some Prednisone  for him to try

## 2024-03-17 NOTE — Addendum Note (Signed)
 Addended by: JOHNNY SENIOR A on: 03/17/2024 02:12 PM   Modules accepted: Orders

## 2024-03-17 NOTE — Telephone Encounter (Signed)
 Copied from CRM [id number]. Topic: Clinical - Medical Advice >> Mar 16, 2024  4:15 PM Sasha M wrote: Reason for CRM: Pt called in to ask [name] a question for Dr. Johnny, pt has been receiving treatment for pain(tingle and numbness) in his hands for about 7 months and wants to know if the medication Prednisone  would help. He's having a hard time sleeping and even opening his medication bottles because the pain is so bad. He says if it is an ok option, can you send a script to the pharmacy on file and if not then give him a call and advise on the next best thing.

## 2024-03-19 ENCOUNTER — Other Ambulatory Visit: Payer: Self-pay | Admitting: Family Medicine

## 2024-03-27 DIAGNOSIS — M79642 Pain in left hand: Secondary | ICD-10-CM | POA: Diagnosis not present

## 2024-03-27 DIAGNOSIS — M79641 Pain in right hand: Secondary | ICD-10-CM | POA: Diagnosis not present

## 2024-04-02 ENCOUNTER — Ambulatory Visit: Payer: Self-pay

## 2024-04-02 DIAGNOSIS — G629 Polyneuropathy, unspecified: Secondary | ICD-10-CM

## 2024-04-02 NOTE — Telephone Encounter (Signed)
 FYI Only or Action Required?: Action required by provider: Needs to know how to taper prednisone . Pt wants a call back within the hour regarding taper. Pt usually takes the first does in the am.  Sig for prednisone  was changed by ortho to 5mg  2 times daily the first week and then 1 times daily for 1 week. Pt has taken 2, 5 mg prednisone  tablets Monday, Tuesday  and weds. He has not taken any today, which is why he is requesting a call back within the hour.  Patient was last seen in primary care on 02/26/2024 by Anthony Garnette LABOR, MD.  Called Nurse Triage reporting Medication Problem.  Symptoms began yesterday.  Interventions attempted: Nothing. - nearly called EMS from felling so off. He is nervous and jittery and states he was tearful last night.  Symptoms are: stable.  Triage Disposition: No disposition on file. - requesting advise and instruction  Patient/caregiver understands and will follow disposition?: yes                           Copied from CRM [id number]. Topic: Clinical - Red Word Triage >> Apr 02, 2024  8:07 AM Anthony Benton wrote: Red Word that prompted transfer to Nurse Triage: Patient is having bad side effects from medicine that he is taking. Reason for Disposition  [1] Caller has URGENT medicine question about med that primary care doctor (or NP/PA) or specialist prescribed AND [2] triager unable to answer question  Answer Assessment - Initial Assessment Questions 1. NAME of MEDICINE: What medicine(s) are you calling about?     Prednisone  2. QUESTION: What is your question? (e.g., double dose of medicine, side effect)     Pt thinks the prednisone  is causing side. 3. PRESCRIBER: Who prescribed the medicine? Reason: if prescribed by specialist, call should be referred to that group.     Dr. Johnny - instructions modified by Ortho. Dr. Johnny sig was 1 5mg  pred 1 x daily. Ortho changed to 2 times daily for 1 week, and then 1  5mg   1times daily for 1 week. 4.  SYMPTOMS: Do you have any symptoms? If Yes, ask: What symptoms are you having?  How bad are the symptoms (e.g., mild, moderate, severe)     Hands are tingling/ painful. Both of his feet feel funny - tingling and burning, Vision was blurry.  Feels a bit nervous. Hands have been tingling and painful since March.  Pt has taken 2  5 mg prednisone  on Monday, Tuesday and weds. Pt feels that the prednisone  is causing the feet tingling and pain. He also states that hands are no better.   Pt would like to know if he can simply stop the prednisone , or if he needs to taper the dose.  Pt would like instructions on stopping this medication.   Pt refuses, Appt, and also refuses gabapentin.  Protocols used: Medication Question Call-A-AH

## 2024-04-03 ENCOUNTER — Other Ambulatory Visit: Payer: Self-pay

## 2024-04-03 NOTE — Telephone Encounter (Signed)
 Spoke with pt stated that he stopped taking prednisone  on 04/01/24, pt states he was feeling worse while taking this Rx that not medication. Pt states that he wants to get to the root cause of his pain in his hands and feet and not masking the pain with pain medication. Pt requests to have a CT /MRI to find what is going on. Please advise

## 2024-04-03 NOTE — Telephone Encounter (Signed)
 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 This does not show up on any type of Xray or scan, so I will not order any of these. The best advice I can offer him is for him to see a neuropathy specialist (Neurology) for their opinion. I would be happy to make such a referral

## 2024-04-03 NOTE — Telephone Encounter (Signed)
 Tell him to decrease the Prednisone  to one tablet daily and then keep it there (as we had agreed previously)

## 2024-04-06 ENCOUNTER — Telehealth: Payer: Self-pay | Admitting: *Deleted

## 2024-04-06 DIAGNOSIS — G629 Polyneuropathy, unspecified: Secondary | ICD-10-CM | POA: Insufficient documentation

## 2024-04-06 NOTE — Telephone Encounter (Signed)
 I did a referral to [facility] Neurology (not )

## 2024-04-06 NOTE — Telephone Encounter (Signed)
 Copied from CRM #[id number]. Topic: Clinical - Request for Lab/Test Order >> Apr 06, 2024  1:05 PM Suzen RAMAN wrote: Reason for CRM: patient would like to know if he can come in to have labs completed. Vitamin b12 recheck along with other vitamin levels and patient would like Nancy([facility] Nurse) to give him a call back. Patient would like to come in this week to have labs completed.     CB#[phone]

## 2024-04-06 NOTE — Addendum Note (Signed)
 Addended by: JOHNNY SENIOR A on: 04/06/2024 01:00 PM   Modules accepted: Orders

## 2024-04-06 NOTE — Telephone Encounter (Signed)
 Spoke with pt advised of Dr Johnny recommendation, pt agrees for referral to a new Neurology who is experienced and not the previous office. Please advise

## 2024-04-07 NOTE — Telephone Encounter (Signed)
 Pt aware that he will get a call from Neurology office for scheduling

## 2024-04-08 ENCOUNTER — Encounter: Payer: Self-pay | Admitting: Family Medicine

## 2024-04-08 ENCOUNTER — Ambulatory Visit: Admitting: Family Medicine

## 2024-04-08 VITALS — BP 110/62 | HR 74 | Temp 97.7°F | Wt 160.0 lb

## 2024-04-08 DIAGNOSIS — R5383 Other fatigue: Secondary | ICD-10-CM | POA: Diagnosis not present

## 2024-04-08 DIAGNOSIS — G629 Polyneuropathy, unspecified: Secondary | ICD-10-CM

## 2024-04-08 DIAGNOSIS — E538 Deficiency of other specified B group vitamins: Secondary | ICD-10-CM

## 2024-04-08 MED ORDER — CYANOCOBALAMIN 1000 MCG/ML IJ SOLN
1000.0000 ug | Freq: Once | INTRAMUSCULAR | Status: AC
  Administered 2024-04-08: 1000 ug via INTRAMUSCULAR

## 2024-04-08 NOTE — Progress Notes (Signed)
   Subjective:    Patient ID: Anthony Benton., male    DOB: 01-29-1951, 73 y.o.   MRN: [id number]  HPI Here to follow up on generalized fatigue and neuropthy. We saw him on 02-26-24, and labs were drawn that came back normal. These included a B12 level at 448. Today he wants to check more vitamin levels, and he is convinced that he does not have enough B12 in his body. We had referred him to [facility], but he says he has not heard from them yet.    Review of Systems  Constitutional:  Positive for fatigue.  Respiratory: Negative.    Cardiovascular: Negative.   Neurological:  Positive for numbness.       Objective:   Physical Exam Constitutional:      Appearance: Normal appearance.  Cardiovascular:     Rate and Rhythm: Normal rate and regular rhythm.     Pulses: Normal pulses.     Heart sounds: Normal heart sounds.  Pulmonary:     Effort: Pulmonary effort is normal.     Breath sounds: Normal breath sounds.  Neurological:     Mental Status: He is alert.           Assessment & Plan:  Fatigue and neuropathy. I gave him the phone number to [facility] so he can call them for an appt. We will check vitamin levels today. We will also give him a B12 shot.  Garnette Olmsted, MD

## 2024-04-08 NOTE — Telephone Encounter (Signed)
 Spoke with pt on 04/06/24 advised that Dr Johnny will place a referral to Neurology. Voiced understanding

## 2024-04-08 NOTE — Addendum Note (Signed)
 Addended by: LADONNA [name] SAILOR on: 04/08/2024 05:18 PM   Modules accepted: Orders

## 2024-04-11 LAB — VITAMIN B6: Vitamin B6: 11.5 ng/mL (ref 2.1–21.7)

## 2024-04-11 LAB — VITAMIN D 25 HYDROXY (VIT D DEFICIENCY, FRACTURES): Vit D, 25-Hydroxy: 78 ng/mL (ref 30–100)

## 2024-04-11 LAB — VITAMIN B12: Vitamin B-12: 421 pg/mL (ref 200–1100)

## 2024-04-11 LAB — VITAMIN B1: Vitamin B1 (Thiamine): 14 nmol/L (ref 8–30)

## 2024-04-13 ENCOUNTER — Ambulatory Visit: Payer: Self-pay | Admitting: Family Medicine

## 2024-04-13 ENCOUNTER — Other Ambulatory Visit: Payer: Self-pay | Admitting: Family Medicine

## 2024-04-14 ENCOUNTER — Telehealth: Payer: Self-pay | Admitting: Family Medicine

## 2024-04-14 NOTE — Telephone Encounter (Signed)
 Copied from CRM #[id number]. Topic: Clinical - Prescription Issue >> Apr 14, 2024  1:22 PM Eva FALCON wrote: Reason for CRM: Pt is calling to check status of his diazepam . I let him know of the 3 business day protocol, but pt is nervous since the holidays are coming up. Was hoping to have it sent before tomorrow so he could pick it up. Please call for further questions or concerns.

## 2024-04-14 NOTE — Telephone Encounter (Signed)
 Spoke with pt pharmacy advised that pt last refill for Diazepam  5 mg tablets has expired, pt pharmacy needs a new Rx. Please advise

## 2024-04-15 MED ORDER — DIAZEPAM 5 MG PO TABS: 5.0000 mg | ORAL_TABLET | Freq: Three times a day (TID) | ORAL | 5 refills | Status: AC | PRN

## 2024-04-15 NOTE — Telephone Encounter (Signed)
 Done

## 2024-04-20 NOTE — Telephone Encounter (Signed)
 Spoke with pt aware that Dr Johnny sent refill for diazepam  to his pharmacy. Voiced understanding

## 2024-05-06 ENCOUNTER — Telehealth: Payer: Self-pay | Admitting: Diagnostic Neuroimaging

## 2024-05-06 NOTE — Telephone Encounter (Signed)
 My manager Angie spoke with me about this patient, stated she had spoken with him and that he was upset that he had not received a call to get scheduled and that a referral was supposed to have been sent to us  from Dr. Mira office on 04/08/24. I reviewed pt's chart-I did see the office notes with Dr. Johnny from 11/19 and saw that a neurology referral was put in, but was never routed to any office so no one ever received this. I routed referral to us  and reviewed chart and called patient to get him scheduled. Had an extended conversation with patient where he told me the history of what is going on with his hands, feels like this started from a bad blood draw a year ago and now has nerve issues. 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 States he had to call the ambulance at one time due to not being able to use his hands, but did not go to the hospital. He asked several times why we had not called him to schedule and I repeated that it was because we never received a referral for him, but I did see it in the system and am able to get him scheduled. I offered patient our first available 08/14/24, patient stated that is just not going to work for him, he is in pain and has been in pain for a year and wants an appointment this week or at the very least this year. Stated if anyone deserves to see one of your doctors its me. Stated he is going to get the local news involved and let them know about this, that it is ridiculous that a senior citizen has to wait 3 months for an appointment. He stated that I need to get with my manager and have her call him back with a sooner appointment. I let him know unfortunately I do not have access to nor does anyone else here have access to any sooner appointment as the schedules are completely full, I can add him to our wait list and if anything sooner comes  available, we can give him a call back. Patient did eventually agree to schedule and I have him on for 08/15/23 at 11:30 with Dr. Margaret. He is on the waitlist as well. Patient stated he is going to get legal involved to see why our office and Dr. Johnny couldn't work together sooner and figure out the problem and call him sooner. Again, I explained to patient that we never received the referral and that each office handles their referrals differently so the reason the referral never got to us , would be a question for Dr. Mira team. Advised he would go to the hospital in the mean time if worsening.  Just an FYI as this was an extended conversation and patient advised he was going to reach out to the news and legal about this situation.

## 2024-05-08 ENCOUNTER — Ambulatory Visit

## 2024-05-08 DIAGNOSIS — E538 Deficiency of other specified B group vitamins: Secondary | ICD-10-CM

## 2024-05-08 MED ORDER — CYANOCOBALAMIN 1000 MCG/ML IJ SOLN
1000.0000 ug | Freq: Once | INTRAMUSCULAR | Status: AC
  Administered 2024-05-08: 1000 ug via INTRAMUSCULAR

## 2024-05-08 NOTE — Progress Notes (Signed)
 Patient is in office today for a nurse visit for B12 Injection. Patient Injection was given in the  Right deltoid. Patient tolerated injection well.

## 2024-05-11 NOTE — Telephone Encounter (Signed)
 Called pt to offer a sooner appt for today, but stated he can't make it work. Had again an extended conversation with patient, complained that we didn't give him enough advance notice for appt opening today, I let him know I was sorry, but it just came up that someone had cancelled so I wanted to offer it as I know he had requested a sooner appt. Then started to talk about how he is going to come by the office at some point to discuss with Angie the doctor he is scheduled to see, because he does not have as good reviews as some of the other ones here. I let him know there are 2 other providers he can see for this dx, but both are scheduling further out and he said okay well that is another thing I want to discuss with her and again talked about how he has been dealing with this pain for 9 months and it is unacceptable for him to have to wait 3 more months to see a neurologist. I again discussed that we schedule referrals based off of priority and his referral was entered by his PCP as routine, which we schedule for first available appointment. If he has urgent concerns, recommend he speak with his PCP and see if there is anything they can offer him in the meantime and we will continue to look for sooner openings for him.

## 2024-06-05 ENCOUNTER — Ambulatory Visit: Payer: Self-pay

## 2024-06-05 NOTE — Telephone Encounter (Signed)
 FYI Only or Action Required?: Action required by provider: clinical question for provider.  Patient was last seen in primary care on 04/08/2024 by Anthony Garnette LABOR, MD.  Called Nurse Triage reporting Patient Refused Triage.  Triage Disposition: Call PCP When Office is Open  Patient/caregiver understands and will follow disposition?: Yes     Reason for Triage: Both of patients hand swelling and pain/burning - wants to know if it's okay to start back on prednisone  medication   Reason for Disposition  [1] Caller requesting NON-URGENT health information AND [2] PCP's office is the best resource  Answer Assessment - Initial Assessment Questions 1. REASON FOR CALL: What is the main reason for your call? or How can I best help you? Pt noted to be disgruntled, speaking in a loud voice, refused NT and requested that [name] from Dr. Mira office give him a call back before the end of day. He insists that Nancy tell him whether or not to resume taking prednisone  (which he already has) for his swollen and tender hands. This RN conveyed that his message will be passed on, but that a quick response is not guaranteed. Pt advised to call this number back if he has any other questions.  Protocols used: Information Only Call - No Triage-A-AH

## 2024-06-08 NOTE — Telephone Encounter (Signed)
 Yes he should stay on the Prednisone 

## 2024-06-08 NOTE — Telephone Encounter (Signed)
 Spoke with pt advised of Dr Johnny response pt voiced understanding

## 2024-06-13 ENCOUNTER — Other Ambulatory Visit: Payer: Self-pay | Admitting: Cardiology

## 2024-06-16 ENCOUNTER — Other Ambulatory Visit: Payer: Self-pay | Admitting: Family Medicine

## 2024-06-18 ENCOUNTER — Other Ambulatory Visit: Payer: Self-pay | Admitting: Cardiology

## 2024-08-14 ENCOUNTER — Ambulatory Visit: Admitting: Diagnostic Neuroimaging
